# Patient Record
Sex: Male | Born: 1950 | Race: White | Hispanic: No | Marital: Married | Smoking: Former smoker
Health system: Southern US, Community
[De-identification: ages and names within clinical notes are randomized; demographics above are authoritative.]

## PROBLEM LIST (undated history)

## (undated) DIAGNOSIS — D494 Neoplasm of unspecified behavior of bladder: Secondary | ICD-10-CM

## (undated) DIAGNOSIS — N3289 Other specified disorders of bladder: Secondary | ICD-10-CM

## (undated) DIAGNOSIS — C801 Malignant (primary) neoplasm, unspecified: Secondary | ICD-10-CM

## (undated) DIAGNOSIS — R3912 Poor urinary stream: Secondary | ICD-10-CM

## (undated) DIAGNOSIS — J45909 Unspecified asthma, uncomplicated: Secondary | ICD-10-CM

## (undated) DIAGNOSIS — J449 Chronic obstructive pulmonary disease, unspecified: Secondary | ICD-10-CM

## (undated) DIAGNOSIS — K219 Gastro-esophageal reflux disease without esophagitis: Secondary | ICD-10-CM

## (undated) DIAGNOSIS — I712 Thoracic aortic aneurysm, without rupture, unspecified: Secondary | ICD-10-CM

## (undated) DIAGNOSIS — L989 Disorder of the skin and subcutaneous tissue, unspecified: Secondary | ICD-10-CM

## (undated) DIAGNOSIS — I714 Abdominal aortic aneurysm, without rupture, unspecified: Secondary | ICD-10-CM

## (undated) DIAGNOSIS — K5792 Diverticulitis of intestine, part unspecified, without perforation or abscess without bleeding: Secondary | ICD-10-CM

## (undated) DIAGNOSIS — E279 Disorder of adrenal gland, unspecified: Secondary | ICD-10-CM

## (undated) DIAGNOSIS — E663 Overweight: Secondary | ICD-10-CM

## (undated) DIAGNOSIS — E278 Other specified disorders of adrenal gland: Secondary | ICD-10-CM

## (undated) DIAGNOSIS — K5090 Crohn's disease, unspecified, without complications: Secondary | ICD-10-CM

## (undated) HISTORY — DX: Diverticulitis of intestine, part unspecified, without perforation or abscess without bleeding: K57.92

## (undated) HISTORY — DX: Overweight: E66.3

## (undated) HISTORY — PX: APPENDECTOMY: SHX54

## (undated) HISTORY — DX: Crohn's disease, unspecified, without complications: K50.90

## (undated) HISTORY — PX: TONSILLECTOMY: SUR1361

## (undated) HISTORY — DX: Neoplasm of unspecified behavior of bladder: D49.4

## (undated) HISTORY — DX: Poor urinary stream: R39.12

## (undated) HISTORY — PX: CHOLECYSTECTOMY: SHX55

## (undated) HISTORY — DX: Disorder of adrenal gland, unspecified: E27.9

## (undated) HISTORY — DX: Disorder of the skin and subcutaneous tissue, unspecified: L98.9

## (undated) HISTORY — DX: Other specified disorders of adrenal gland: E27.8

## (undated) HISTORY — DX: Unspecified asthma, uncomplicated: J45.909

## (undated) HISTORY — DX: Other specified disorders of bladder: N32.89

---

## 2014-01-17 DIAGNOSIS — J3089 Other allergic rhinitis: Secondary | ICD-10-CM | POA: Insufficient documentation

## 2014-01-17 DIAGNOSIS — J449 Chronic obstructive pulmonary disease, unspecified: Secondary | ICD-10-CM | POA: Insufficient documentation

## 2014-01-17 DIAGNOSIS — J4489 Other specified chronic obstructive pulmonary disease: Secondary | ICD-10-CM | POA: Insufficient documentation

## 2014-01-17 HISTORY — DX: Chronic obstructive pulmonary disease, unspecified: J44.9

## 2014-01-17 HISTORY — DX: Other specified chronic obstructive pulmonary disease: J44.89

## 2014-08-17 ENCOUNTER — Ambulatory Visit: Payer: Self-pay | Admitting: Urology

## 2014-11-13 ENCOUNTER — Other Ambulatory Visit: Payer: Self-pay | Admitting: Gastroenterology

## 2014-11-13 DIAGNOSIS — K5090 Crohn's disease, unspecified, without complications: Secondary | ICD-10-CM

## 2014-11-13 DIAGNOSIS — R748 Abnormal levels of other serum enzymes: Secondary | ICD-10-CM

## 2014-11-23 ENCOUNTER — Ambulatory Visit (HOSPITAL_COMMUNITY)
Admission: RE | Admit: 2014-11-23 | Discharge: 2014-11-23 | Disposition: A | Discharge status: Home / Self Care | Payer: BLUE CROSS/BLUE SHIELD | Source: Ambulatory Visit | Attending: Gastroenterology | Admitting: Gastroenterology

## 2014-11-23 DIAGNOSIS — R932 Abnormal findings on diagnostic imaging of liver and biliary tract: Secondary | ICD-10-CM | POA: Insufficient documentation

## 2014-11-23 DIAGNOSIS — K5090 Crohn's disease, unspecified, without complications: Secondary | ICD-10-CM | POA: Insufficient documentation

## 2014-11-23 DIAGNOSIS — I700 Atherosclerosis of aorta: Secondary | ICD-10-CM | POA: Insufficient documentation

## 2014-11-23 DIAGNOSIS — R748 Abnormal levels of other serum enzymes: Secondary | ICD-10-CM | POA: Diagnosis not present

## 2014-11-27 ENCOUNTER — Other Ambulatory Visit: Payer: Self-pay | Admitting: Gastroenterology

## 2014-11-27 DIAGNOSIS — R1032 Left lower quadrant pain: Secondary | ICD-10-CM

## 2014-11-30 ENCOUNTER — Encounter (HOSPITAL_COMMUNITY): Payer: Self-pay

## 2014-11-30 ENCOUNTER — Ambulatory Visit (HOSPITAL_COMMUNITY)
Admission: RE | Admit: 2014-11-30 | Discharge: 2014-11-30 | Disposition: A | Discharge status: Home / Self Care | Payer: BLUE CROSS/BLUE SHIELD | Source: Ambulatory Visit | Attending: Gastroenterology | Admitting: Gastroenterology

## 2014-11-30 DIAGNOSIS — R1032 Left lower quadrant pain: Secondary | ICD-10-CM | POA: Diagnosis not present

## 2014-11-30 HISTORY — DX: Malignant (primary) neoplasm, unspecified: C80.1

## 2014-11-30 MED ORDER — IOHEXOL 300 MG/ML  SOLN
100.0000 mL | Freq: Once | INTRAMUSCULAR | Status: AC | PRN
  Administered 2014-11-30: 100 mL via INTRAVENOUS

## 2014-12-03 LAB — SURGICAL PATHOLOGY

## 2014-12-09 NOTE — Op Note (Signed)
PATIENT NAME:  Caleb Evans, Caleb Evans MR#:  [id number] DATE OF BIRTH:  08/23/1950  DATE OF PROCEDURE:  08/17/2014  PREOPERATIVE DIAGNOSIS: Bladder tumor.   POSTOPERATIVE DIAGNOSIS: Bladder tumor.   PROCEDURE: Transurethral resection superficial bladder tumor, 2 cm in size.    SURGEON: Taleah Bellantoni D. Elnoria Howard, DO  ANESTHESIA: General.   DESCRIPTION OF PROCEDURE: With the patient sterilely prepped and draped in supine lithotomy position for ease of approach to the external genitalia, we have a timeout in the room. After the timeout is addressed, we begin the procedure. The distal urethra has to be dilated with male sounds one time to 60 [place]. It is done easily. I put the obturator of the saline resectoscope in and under direct vision view of the urethra and bladder. The urethra is normal. Prostate is slightly enlarged with some slight increase in the bladder neck height, but otherwise there is no real obstructive component to the prostate. The bladder is viewed. There is a single isolated tumor just proximal to the orifice of the left ureter. It is resected in one sweep. The area is cauterized, but there is no bleeding. The bipolar electrode on cutting actually took care of any bleeding. I check again after the bladder is emptied and after we remove all of the bladder tumor fragments. There is still no bleeding from the area of the resection. It has been fulgurated on the edges with cautery. He is sent to recovery after the bladder is emptied and 30 mL of 0.5% plain Marcaine placed in the bladder and a B and O suppository in the rectum. Bimanual exam is negative with the prostate small, nonnodular, firm and bilaterally symmetric. There are no rectal masses.     ____________________________ Janice Coffin. Elnoria Howard, DO rdh:at D: 08/17/2014 13:49:27 ET T: 08/17/2014 15:34:26 ET JOB#: [id number]  cc: Janice Coffin. Elnoria Howard, DO, <Dictator> [place] D Waneda Klammer DO ELECTRONICALLY SIGNED 09/10/2014 11:08

## 2015-02-15 ENCOUNTER — Other Ambulatory Visit: Payer: Self-pay | Admitting: Urology

## 2015-02-28 ENCOUNTER — Other Ambulatory Visit: Payer: Self-pay

## 2015-02-28 ENCOUNTER — Ambulatory Visit (INDEPENDENT_AMBULATORY_CARE_PROVIDER_SITE_OTHER): Payer: BLUE CROSS/BLUE SHIELD | Admitting: Urology

## 2015-02-28 ENCOUNTER — Encounter: Payer: Self-pay | Admitting: Urology

## 2015-02-28 ENCOUNTER — Encounter: Payer: Self-pay | Admitting: *Deleted

## 2015-02-28 VITALS — BP 144/99 | HR 77 | Resp 18 | Wt 172.4 lb

## 2015-02-28 DIAGNOSIS — C679 Malignant neoplasm of bladder, unspecified: Secondary | ICD-10-CM

## 2015-02-28 MED ORDER — LIDOCAINE HCL 2 % EX GEL
1.0000 "application " | Freq: Once | CUTANEOUS | Status: AC
  Administered 2015-02-28: 1 via URETHRAL

## 2015-02-28 MED ORDER — CIPROFLOXACIN HCL 500 MG PO TABS
500.0000 mg | ORAL_TABLET | Freq: Once | ORAL | Status: AC
  Administered 2015-02-28: 500 mg via ORAL

## 2015-02-28 NOTE — Progress Notes (Signed)
    Cystoscopy Procedure Note  Patient identification was confirmed, informed consent was obtained, and patient was prepped using Betadine solution.  Lidocaine jelly was administered per urethral meatus.    Preoperative abx where received prior to procedure.     Pre-Procedure: - Inspection reveals a normal caliber ureteral meatus.  Procedure: The flexible cystoscope was introduced without difficulty - No urethral strictures/leions are present. -  Slightly enlarged prostate with lateral lobe enlargement otherwise normal prostate   Normal bladder neck - Bilateral ureteral orifices identified - Bladder mucosa  reveals no ulcers, tumors, or lesions - No bladder stones - No trabeculation  Retroflexion shows no evidence of tumor    Post-Procedure: - Patient tolerated the procedure well

## 2015-03-01 LAB — URINALYSIS, COMPLETE
Bilirubin, UA: NEGATIVE
Glucose, UA: NEGATIVE
KETONES UA: NEGATIVE
NITRITE UA: NEGATIVE
PROTEIN UA: NEGATIVE
RBC, UA: NEGATIVE
Specific Gravity, UA: 1.01 (ref 1.005–1.030)
Urobilinogen, Ur: 0.2 mg/dL (ref 0.2–1.0)
pH, UA: 7 (ref 5.0–7.5)

## 2015-03-01 LAB — MICROSCOPIC EXAMINATION: Bacteria, UA: NONE SEEN

## 2015-05-31 ENCOUNTER — Encounter: Payer: Self-pay | Admitting: Urology

## 2015-05-31 ENCOUNTER — Ambulatory Visit (INDEPENDENT_AMBULATORY_CARE_PROVIDER_SITE_OTHER): Payer: PRIVATE HEALTH INSURANCE | Admitting: Urology

## 2015-05-31 VITALS — BP 132/89 | HR 79 | Ht 68.0 in | Wt 174.9 lb

## 2015-05-31 DIAGNOSIS — C679 Malignant neoplasm of bladder, unspecified: Secondary | ICD-10-CM

## 2015-05-31 LAB — URINALYSIS, COMPLETE
BILIRUBIN UA: NEGATIVE
GLUCOSE, UA: NEGATIVE
Ketones, UA: NEGATIVE
Nitrite, UA: NEGATIVE
Protein, UA: NEGATIVE
RBC UA: NEGATIVE
SPEC GRAV UA: 1.015 (ref 1.005–1.030)
UUROB: 0.2 mg/dL (ref 0.2–1.0)
pH, UA: 5.5 (ref 5.0–7.5)

## 2015-05-31 LAB — MICROSCOPIC EXAMINATION
Bacteria, UA: NONE SEEN
EPITHELIAL CELLS (NON RENAL): NONE SEEN /HPF (ref 0–10)
RBC, UA: NONE SEEN /hpf (ref 0–?)
RENAL EPITHEL UA: NONE SEEN /HPF

## 2015-05-31 MED ORDER — LIDOCAINE HCL 2 % EX GEL
1.0000 "application " | Freq: Once | CUTANEOUS | Status: AC
  Administered 2015-05-31: 1 via URETHRAL

## 2015-05-31 MED ORDER — CIPROFLOXACIN HCL 500 MG PO TABS
500.0000 mg | ORAL_TABLET | Freq: Once | ORAL | Status: AC
  Administered 2015-05-31: 500 mg via ORAL

## 2015-05-31 NOTE — Progress Notes (Signed)
05/31/2015 11:47 AM   Caleb Evans [birth year] [id number]  Referring provider: Theotis Burrow, MD [address] [place], [place] [address]  Chief Complaint  Patient presents with  . Cysto    CZY:SAYTKZS of bladder cancer with a resection last year this is his 6 month cystoscopy. This is a low-grade bladder tumor seen on the trigone in front of the ureter. It was very small area. 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 Still smoking occasional and he smokes camels.   PMH: Past Medical History  Diagnosis Date  . Cancer ([place])   . Skin lesion   . Weak urinary stream   . Crohn's disease ([place])   . Diverticulitis   . Adrenal nodule ([place])   . Over weight   . Bladder mass   . Hypertension   . Bladder neoplasm     Surgical History: Past Surgical History  Procedure Laterality Date  . Cholecystectomy    . Appendectomy      Home Medications:    Medication List       This list is accurate as of: 05/31/15 11:47 AM.  Always use your most recent med list.               ADVAIR DISKUS 250-50 MCG/DOSE Aepb  Generic drug:  Fluticasone-Salmeterol  Inhale into the lungs.     DYMISTA 137-50 MCG/ACT Susp  Generic drug:  Azelastine-Fluticasone  2 Application by Nasal route 2 (two) times daily.     loratadine 10 MG tablet  Commonly known as:  CLARITIN  Take by mouth.        Allergies: No Known Allergies  Family History: Family History  Problem Relation Age of Onset  . Bladder Cancer Neg Hx   . Kidney cancer Neg Hx   . Prostate cancer Neg Hx     Social History:  reports that he has been smoking Cigarettes.  He has been smoking about 1.00 pack per day. He does not have any smokeless tobacco history on file. He reports that he drinks alcohol. He reports that he does not use illicit drugs.  ROS: UROLOGY Frequent Urination?: No Hard to postpone urination?: No Burning/pain with urination?: No Get up at night to urinate?: No Leakage  of urine?: No Urine stream starts and stops?: No Trouble starting stream?: No Do you have to strain to urinate?: No Blood in urine?: No Urinary tract infection?: No Sexually transmitted disease?: No Injury to kidneys or bladder?: No Painful intercourse?: No Weak stream?: No Erection problems?: No Penile pain?: No  Gastrointestinal Nausea?: No Vomiting?: No Indigestion/heartburn?: Yes Diarrhea?: No Constipation?: No  Constitutional Fever: No Night sweats?: No Weight loss?: No Fatigue?: No  Skin Skin rash/lesions?: No Itching?: No  Eyes Blurred vision?: No Double vision?: No  Ears/Nose/Throat Sore throat?: No Sinus problems?: Yes  Hematologic/Lymphatic Swollen glands?: No Easy bruising?: No  Cardiovascular Leg swelling?: No Chest pain?: No  Respiratory Cough?: No Shortness of breath?: No  Endocrine Excessive thirst?: No  Musculoskeletal Back pain?: No Joint pain?: No  Neurological Headaches?: No Dizziness?: No  Psychologic Depression?: No Anxiety?: No  Physical Exam: BP 132/89 mmHg  Pulse 79  Ht 5' 8"  (1.727 m)  Wt 174 lb 14.4 oz (79.334 kg)  BMI 26.60 kg/m2  Constitutional:  Alert and oriented, No acute distress. HEENT: [place] AT, moist mucus membranes.  Trachea midline, no masses. Cardiovascular: No clubbing, cyanosis, or edema. Respiratory: Normal respiratory effort, no increased work of breathing. GI: Abdomen  is soft, nontender, nondistended, no abdominal masses GU: No CVA tenderness. Circumcised testes descended and normal prostate small small grade 2 over 4 at most with no nodules tumors or masses Skin: No rashes, bruises or suspicious lesions. Lymph: No cervical or inguinal adenopathy. Neurologic: Grossly intact, no focal deficits, moving all 4 extremities. Psychiatric: Normal mood and affect.  Laboratory Data: No results found for: WBC, HGB, HCT, MCV, PLT  No results found for: CREATININE  No results found for: PSA  No results  found for: TESTOSTERONE  No results found for: HGBA1C  Urinalysis    Component Value Date/Time   GLUCOSEU Negative 02/28/2015 1654   BILIRUBINUR Negative 02/28/2015 1654   NITRITE Negative 02/28/2015 1654   LEUKOCYTESUR Trace* 02/28/2015 1654    Pertinent Imaging: None  Assessment & Plan:    1. Malignant neoplasm of urinary bladder, unspecified site (HCC) TCC of the bladder was on the trigone and was low-grade malignant potential so he is not undergoing any postoperative immunotherapy with BCG he's on surveillance cystoscopy every 3 months for a year. Patient is no longer working with then aligned eyes but is occasionally smoking his camels advised not to continue smoking. He will have another cystoscopy in 3 months today cystoscopy was negative for tumor mass or growth and minimal prostate enlargement no tumor in the prostate  - lidocaine (XYLOCAINE) 2 % jelly 1 application; Place 1 application into the urethra once. - ciprofloxacin (CIPRO) tablet 500 mg; Take 1 tablet (500 mg total) by mouth once. - Urinalysis, Complete   No Follow-up on file.  Collier Flowers, [facility] [address] [place], [place] [address] [phone]

## 2015-05-31 NOTE — Progress Notes (Signed)
    Cystoscopy Procedure Note  Patient identification was confirmed, informed consent was obtained, and patient was prepped using Betadine solution.  Lidocaine jelly was administered per urethral meatus.    Preoperative abx where received prior to procedure.     Pre-Procedure: - Inspection reveals a normal caliber ureteral meatus.  Procedure: The flexible cystoscope was introduced without difficulty - No urethral strictures/lesions are present. -  prostate minimally enlarged -  bladder neck - Bilateral ureteral orifices identified - Bladder mucosa  reveals no ulcers, tumors, or lesions - No bladder stones - No trabeculation  Retroflexion shows no tumors or growths.   Post-Procedure: - Patient tolerated the procedure well

## 2015-08-29 ENCOUNTER — Telehealth: Payer: Self-pay | Admitting: Urology

## 2015-08-29 NOTE — Telephone Encounter (Signed)
Pt called and said he was just getting over a liver infection and wants to know if it's still O.K. To come if for cysto tomorrow afternoon.  Please advise.

## 2015-08-30 ENCOUNTER — Other Ambulatory Visit: Payer: BLUE CROSS/BLUE SHIELD | Admitting: Urology

## 2015-09-25 ENCOUNTER — Other Ambulatory Visit: Payer: BLUE CROSS/BLUE SHIELD

## 2015-09-26 ENCOUNTER — Other Ambulatory Visit: Payer: BLUE CROSS/BLUE SHIELD

## 2015-09-26 ENCOUNTER — Ambulatory Visit (INDEPENDENT_AMBULATORY_CARE_PROVIDER_SITE_OTHER): Payer: PRIVATE HEALTH INSURANCE | Admitting: Urology

## 2015-09-26 VITALS — BP 132/91 | HR 76 | Ht 68.0 in | Wt 174.9 lb

## 2015-09-26 DIAGNOSIS — C679 Malignant neoplasm of bladder, unspecified: Secondary | ICD-10-CM | POA: Diagnosis not present

## 2015-09-26 LAB — URINALYSIS, COMPLETE
BILIRUBIN UA: NEGATIVE
Glucose, UA: NEGATIVE
KETONES UA: NEGATIVE
Leukocytes, UA: NEGATIVE
Nitrite, UA: NEGATIVE
PROTEIN UA: NEGATIVE
RBC UA: NEGATIVE
Specific Gravity, UA: 1.02 (ref 1.005–1.030)
UUROB: 0.2 mg/dL (ref 0.2–1.0)
pH, UA: 5 (ref 5.0–7.5)

## 2015-09-26 LAB — MICROSCOPIC EXAMINATION
Bacteria, UA: NONE SEEN
Epithelial Cells (non renal): NONE SEEN /hpf (ref 0–10)
RBC MICROSCOPIC, UA: NONE SEEN /HPF (ref 0–?)

## 2015-09-26 MED ORDER — CIPROFLOXACIN HCL 500 MG PO TABS
500.0000 mg | ORAL_TABLET | Freq: Once | ORAL | Status: AC
  Administered 2015-09-26: 500 mg via ORAL

## 2015-09-26 MED ORDER — LIDOCAINE HCL 2 % EX GEL
1.0000 "application " | Freq: Once | CUTANEOUS | Status: AC
  Administered 2015-09-26: 1 via URETHRAL

## 2015-09-26 NOTE — Progress Notes (Signed)
09/26/2015 2:51 PM   [name] 29-Apr-1951 [id number]  Referring provider: Theotis Burrow, MD [address] [place], [place] [address]  Chief Complaint  Patient presents with  . 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 He has done well since his last appointment with no complaints or hematuria.   PMH: Past Medical History  Diagnosis Date  . Cancer ([place])   . Skin lesion   . Weak urinary stream   . Crohn's disease ([place])   . Diverticulitis   . Adrenal nodule ([place])   . Over weight   . Bladder mass   . Hypertension   . Bladder neoplasm     Surgical History: Past Surgical History  Procedure Laterality Date  . Cholecystectomy    . Appendectomy      Home Medications:    Medication List       This list is accurate as of: 09/26/15  2:51 PM.  Always use your most recent med list.               ADVAIR DISKUS 250-50 MCG/DOSE Aepb  Generic drug:  Fluticasone-Salmeterol  Inhale into the lungs.     DYMISTA 137-50 MCG/ACT Susp  Generic drug:  Azelastine-Fluticasone  Reported on 09/26/2015     loratadine 10 MG tablet  Commonly known as:  CLARITIN  Take by mouth.        Allergies: No Known Allergies  Family History: Family History  Problem Relation Age of Onset  . Bladder Cancer Neg Hx   . Kidney cancer Neg Hx   . Prostate cancer Neg Hx     Social History:  reports that he has been smoking Cigarettes.  He has been smoking about 1.00 pack per day. He does not have any smokeless tobacco history on file. He reports that he drinks alcohol. He reports that he does not use illicit drugs.  ROS:                                        Physical Exam: BP 132/91 mmHg  Pulse 76  Ht 5' 8"  (1.727 m)  Wt 174 lb 14.4 oz (79.334 kg)  BMI 26.60 kg/m2  Constitutional:  Alert and oriented, No acute  distress. HEENT: [place] AT, moist mucus membranes.  Trachea midline, no masses. Cardiovascular: No clubbing, cyanosis, or edema. Respiratory: Normal respiratory effort, no increased work of breathing. GI: Abdomen is soft, nontender, nondistended, no abdominal masses GU: No CVA tenderness.  Skin: No rashes, bruises or suspicious lesions. Lymph: No cervical or inguinal adenopathy. Neurologic: Grossly intact, no focal deficits, moving all 4 extremities. Psychiatric: Normal mood and affect.  Laboratory Data: No results found for: WBC, HGB, HCT, MCV, PLT  No results found for: CREATININE  No results found for: PSA  No results found for: TESTOSTERONE  No results found for: HGBA1C  Urinalysis    Component Value Date/Time   GLUCOSEU Negative 05/31/2015 1121   BILIRUBINUR Negative 05/31/2015 1121   NITRITE Negative 05/31/2015 1121   LEUKOCYTESUR Trace* 05/31/2015 1121     Cystoscopy Procedure Note  Patient identification was confirmed, informed consent was obtained, and patient was prepped using Betadine solution.  Lidocaine jelly was administered per urethral meatus.    Preoperative abx where received prior  to procedure.     Pre-Procedure: - Inspection reveals a normal caliber ureteral meatus.  Procedure: The flexible cystoscope was introduced without difficulty - No urethral strictures/lesions are present. - Enlarged prostate  - Normal bladder neck - Bilateral ureteral orifices identified - Bladder mucosa  reveals no ulcers, tumors, or lesions - No bladder stones - No trabeculation  Retroflexion shows no intravesical lobe or tumor   Post-Procedure: - Patient tolerated the procedure well    Assessment & Plan:    1. pTa TCC of urinary bladder status post TURBT in general 2016 with negative surveillance cystoscopy today -follow up in 3 months for repeat surveillance cystoscopy   Return in about 3 months (around 12/24/2015) for cystoscopy.  Nickie Retort,  MD  [facility] [address], [place], [place] [address] [phone]

## 2015-12-25 ENCOUNTER — Other Ambulatory Visit: Payer: BLUE CROSS/BLUE SHIELD

## 2015-12-26 ENCOUNTER — Encounter: Payer: Self-pay | Admitting: Urology

## 2015-12-26 ENCOUNTER — Ambulatory Visit (INDEPENDENT_AMBULATORY_CARE_PROVIDER_SITE_OTHER): Payer: PRIVATE HEALTH INSURANCE | Admitting: Urology

## 2015-12-26 VITALS — BP 137/83 | HR 69 | Ht 68.0 in | Wt 178.4 lb

## 2015-12-26 DIAGNOSIS — C678 Malignant neoplasm of overlapping sites of bladder: Secondary | ICD-10-CM

## 2015-12-26 LAB — MICROSCOPIC EXAMINATION: Bacteria, UA: NONE SEEN

## 2015-12-26 LAB — URINALYSIS, COMPLETE
BILIRUBIN UA: NEGATIVE
Glucose, UA: NEGATIVE
KETONES UA: NEGATIVE
Nitrite, UA: NEGATIVE
PROTEIN UA: NEGATIVE
Specific Gravity, UA: 1.02 (ref 1.005–1.030)
Urobilinogen, Ur: 0.2 mg/dL (ref 0.2–1.0)
pH, UA: 7 (ref 5.0–7.5)

## 2015-12-26 MED ORDER — LIDOCAINE HCL 2 % EX GEL
1.0000 "application " | Freq: Once | CUTANEOUS | Status: AC
  Administered 2015-12-26: 1 via URETHRAL

## 2015-12-26 MED ORDER — CIPROFLOXACIN HCL 500 MG PO TABS
500.0000 mg | ORAL_TABLET | Freq: Once | ORAL | Status: AC
  Administered 2015-12-26: 500 mg via ORAL

## 2015-12-26 NOTE — Progress Notes (Signed)
12/26/2015 2:38 PM   [name] 09-12-1950 [id number]  Referring provider: Theotis Burrow, MD [address] [place], [place] [address]  Chief Complaint  Patient presents with  . Cysto    bladder cancer    HPI: The patient is a 65 year old gentleman with a history of pTa TCC (1.2 cm) of the bladder on TURBT in January 2016 presents today for surveillance cystoscopy. He has done well since his last appointment with no complaints or hematuria.     PMH: Past Medical History  Diagnosis Date  . Cancer ([place])   . Skin lesion   . Weak urinary stream   . Crohn's disease ([place])   . Diverticulitis   . Adrenal nodule ([place])   . Over weight   . Bladder mass   . Hypertension   . Bladder neoplasm     Surgical History: Past Surgical History  Procedure Laterality Date  . Cholecystectomy    . Appendectomy      Home Medications:    Medication List       This list is accurate as of: 12/26/15  2:38 PM.  Always use your most recent med list.               ADVAIR DISKUS 250-50 MCG/DOSE Aepb  Generic drug:  Fluticasone-Salmeterol  Inhale into the lungs.     DYMISTA 137-50 MCG/ACT Susp  Generic drug:  Azelastine-Fluticasone  Reported on 12/26/2015        Allergies: No Known Allergies  Family History: Family History  Problem Relation Age of Onset  . Bladder Cancer Neg Hx   . Kidney cancer Neg Hx   . Prostate cancer Neg Hx     Social History:  reports that he has been smoking Cigarettes.  He has been smoking about 1.00 pack per day. He does not have any smokeless tobacco history on file. He reports that he drinks alcohol. He reports that he does not use illicit drugs.  ROS:                                        Physical Exam: Ht 5' 8"  (1.727 m)  Wt 178 lb 6.4 oz (80.922 kg)  BMI 27.13 kg/m2  Constitutional:  Alert and oriented, No acute distress. HEENT: [place] AT, moist mucus membranes.  Trachea midline, no  masses. Cardiovascular: No clubbing, cyanosis, or edema. Respiratory: Normal respiratory effort, no increased work of breathing. GI: Abdomen is soft, nontender, nondistended, no abdominal masses GU: No CVA tenderness.  Skin: No rashes, bruises or suspicious lesions. Lymph: No cervical or inguinal adenopathy. Neurologic: Grossly intact, no focal deficits, moving all 4 extremities. Psychiatric: Normal mood and affect.  Laboratory Data: No results found for: WBC, HGB, HCT, MCV, PLT  No results found for: CREATININE  No results found for: PSA  No results found for: TESTOSTERONE  No results found for: HGBA1C  Urinalysis    Component Value Date/Time   APPEARANCEUR Clear 09/26/2015 1415   GLUCOSEU Negative 09/26/2015 1415   BILIRUBINUR Negative 09/26/2015 1415   PROTEINUR Negative 09/26/2015 1415   NITRITE Negative 09/26/2015 1415   LEUKOCYTESUR Negative 09/26/2015 1415   Cystoscopy Procedure Note  Patient identification was confirmed, informed consent was obtained, and patient was prepped using Betadine solution. Lidocaine jelly was administered per urethral meatus.   Preoperative abx where received prior to procedure.    Pre-Procedure: - Inspection  reveals a normal caliber ureteral meatus.  Procedure: The flexible cystoscope was introduced without difficulty - No urethral strictures/lesions are present. - Enlarged prostate  - Normal bladder neck - Bilateral ureteral orifices identified - Bladder mucosa reveals no ulcers, tumors, or lesions - No bladder stones - No trabeculation  Retroflexion shows no intravesical lobe or tumor   Post-Procedure: - Patient tolerated the procedure well  Assessment & Plan:    1. pTa TCC of urinary bladder status post TURBT in January 2016 with negative surveillance cystoscopy today (low risk) -follow up in 6 months for repeat cystoscopy  Return in about 6 months (around 06/27/2016) for cystoscopy.  Nickie Retort,  MD  [facility] [address], [place], [place] [address] [phone]

## 2016-03-25 ENCOUNTER — Emergency Department (HOSPITAL_COMMUNITY)
Admission: EM | Admit: 2016-03-25 | Discharge: 2016-03-25 | Disposition: A | Discharge status: Home / Self Care | Payer: BLUE CROSS/BLUE SHIELD | Attending: Emergency Medicine | Admitting: Emergency Medicine

## 2016-03-25 ENCOUNTER — Emergency Department (HOSPITAL_COMMUNITY): Payer: BLUE CROSS/BLUE SHIELD

## 2016-03-25 ENCOUNTER — Encounter (HOSPITAL_COMMUNITY): Payer: Self-pay | Admitting: Cardiology

## 2016-03-25 DIAGNOSIS — J069 Acute upper respiratory infection, unspecified: Secondary | ICD-10-CM | POA: Insufficient documentation

## 2016-03-25 DIAGNOSIS — R0602 Shortness of breath: Secondary | ICD-10-CM | POA: Diagnosis present

## 2016-03-25 DIAGNOSIS — I10 Essential (primary) hypertension: Secondary | ICD-10-CM | POA: Diagnosis not present

## 2016-03-25 DIAGNOSIS — J441 Chronic obstructive pulmonary disease with (acute) exacerbation: Secondary | ICD-10-CM | POA: Insufficient documentation

## 2016-03-25 DIAGNOSIS — Z79899 Other long term (current) drug therapy: Secondary | ICD-10-CM | POA: Insufficient documentation

## 2016-03-25 DIAGNOSIS — F17210 Nicotine dependence, cigarettes, uncomplicated: Secondary | ICD-10-CM | POA: Insufficient documentation

## 2016-03-25 MED ORDER — PREDNISONE 20 MG PO TABS: 40.0000 mg | ORAL_TABLET | Freq: Every day | ORAL | 0 refills | Status: AC

## 2016-03-25 MED ORDER — IPRATROPIUM-ALBUTEROL 0.5-2.5 (3) MG/3ML IN SOLN
3.0000 mL | Freq: Once | RESPIRATORY_TRACT | Status: AC
  Administered 2016-03-25: 3 mL via RESPIRATORY_TRACT
  Filled 2016-03-25: qty 3

## 2016-03-25 MED ORDER — IPRATROPIUM BROMIDE 0.02 % IN SOLN: 0.5000 mg | Freq: Once | RESPIRATORY_TRACT | Status: DC

## 2016-03-25 MED ORDER — ALBUTEROL SULFATE (2.5 MG/3ML) 0.083% IN NEBU: 5.0000 mg | INHALATION_SOLUTION | Freq: Once | RESPIRATORY_TRACT | Status: DC

## 2016-03-25 MED ORDER — PREDNISONE 50 MG PO TABS
60.0000 mg | ORAL_TABLET | Freq: Once | ORAL | Status: AC
  Administered 2016-03-25: 60 mg via ORAL
  Filled 2016-03-25: qty 1

## 2016-03-25 MED ORDER — ALBUTEROL SULFATE (2.5 MG/3ML) 0.083% IN NEBU
2.5000 mg | INHALATION_SOLUTION | Freq: Once | RESPIRATORY_TRACT | Status: AC
  Administered 2016-03-25: 2.5 mg via RESPIRATORY_TRACT
  Filled 2016-03-25: qty 3

## 2016-03-25 MED ORDER — DOXYCYCLINE HYCLATE 100 MG PO CAPS: 100.0000 mg | ORAL_CAPSULE | Freq: Two times a day (BID) | ORAL | 0 refills | Status: DC

## 2016-03-25 NOTE — ED Provider Notes (Signed)
[facility] DEPT Provider Note   CSN: [id number] Arrival date & time: 03/25/16  1633     History   Chief Complaint Chief Complaint  Patient presents with  . Shortness of Breath    HPI Caleb Evans is a 65 y.o. male.  The history is provided by the patient.  Shortness of Breath  Associated symptoms include cough, wheezing and chest pain. Pertinent negatives include no abdominal pain.  Patient resents with shortness of breath and cough. History of COPD. He has had a cough with some yellow sputum production over last 3 weeks. States his been worse last couple days. His inhalers have not helped a lot. No fevers or chills. Slight chest tightness. No abdominal pain. No lightheadedness or dizziness. He continues to smoke cigarettes. Shortness of breath is worse with exertion.  Past Medical History:  Diagnosis Date  . Adrenal nodule ([place])   . Bladder mass   . Bladder neoplasm   . Cancer ([place])   . Crohn's disease ([place])   . Diverticulitis   . Hypertension   . Over weight   . Skin lesion   . Weak urinary stream     Patient Active Problem List   Diagnosis Date Noted  . Allergic rhinitis 01/17/2014  . COPD, mild ([place]) 01/17/2014  . Mild chronic obstructive pulmonary disease ([place]) 01/17/2014    Past Surgical History:  Procedure Laterality Date  . APPENDECTOMY    . CHOLECYSTECTOMY         Home Medications    Prior to Admission medications   Medication Sig Start Date End Date Taking? Authorizing Provider  albuterol (PROAIR HFA) 108 (90 Base) MCG/ACT inhaler Inhale 1-2 puffs into the lungs every 6 (six) hours as needed for wheezing or shortness of breath.   Yes Historical Provider, MD  cetirizine (ZYRTEC) 10 MG tablet Take 10 mg by mouth daily.   Yes Historical Provider, MD  fluticasone (FLONASE) 50 MCG/ACT nasal spray Place 2 sprays into both nostrils daily.   Yes Historical Provider, MD  Fluticasone-Salmeterol (ADVAIR DISKUS) 250-50 MCG/DOSE AEPB Inhale 1 puff  into the lungs 2 (two) times daily.  09/28/14  Yes Historical Provider, MD  Multiple Vitamin (MULTIVITAMIN WITH MINERALS) TABS tablet Take 1 tablet by mouth daily.   Yes Historical Provider, MD  doxycycline (VIBRAMYCIN) 100 MG capsule Take 1 capsule (100 mg total) by mouth 2 (two) times daily. 03/25/16   [name], MD  predniSONE (DELTASONE) 20 MG tablet Take 2 tablets (40 mg total) by mouth daily. 03/26/16 03/30/16  [name], MD    Family History Family History  Problem Relation Age of Onset  . Bladder Cancer Neg Hx   . Kidney cancer Neg Hx   . Prostate cancer Neg Hx     Social History Social History  Substance Use Topics  . Smoking status: Current Every Day Smoker    Packs/day: 0.50    Types: Cigarettes  . Smokeless tobacco: Not on file  . Alcohol use 0.0 oz/week     Comment: occasional     Allergies   Amoxicillin-pot clavulanate   Review of Systems Review of Systems  Constitutional: Negative for appetite change.  Respiratory: Positive for cough, shortness of breath and wheezing. Negative for apnea.   Cardiovascular: Positive for chest pain.  Gastrointestinal: Negative for abdominal pain.  Genitourinary: Negative for flank pain.  Musculoskeletal: Negative for back pain.  Hematological: Negative for adenopathy.     Physical Exam Updated Vital Signs BP 130/89 (BP Location: Left Arm)  Pulse 82   Temp 97.6 F (36.4 C) (Oral)   Resp 20   Ht 5' 7"  (1.702 m)   Wt 180 lb (81.6 kg)   SpO2 96%   BMI 28.19 kg/m   Physical Exam  Constitutional: He appears well-developed and well-nourished.  HENT:  Head: Atraumatic.  Eyes: EOM are normal.  Neck: No JVD present.  Cardiovascular: Normal rate.   Pulmonary/Chest:  Mild diffuse wheezes and prolonged expirations. No respiratory distress.  Abdominal: Soft. There is no tenderness.  Musculoskeletal: He exhibits no edema.  Neurological: He is alert.  Skin: Skin is warm. Capillary refill takes less than 2  seconds.  Psychiatric: He has a normal mood and affect.     ED Treatments / Results  Labs (all labs ordered are listed, but only abnormal results are displayed) Labs Reviewed - No data to display  EKG  EKG Interpretation None       Radiology Dg Chest 2 View  Result Date: 03/25/2016 CLINICAL DATA:  Cough. Productive. Short of breath 3 weeks duration, worsening recently. EXAM: CHEST  2 VIEW COMPARISON:  None. FINDINGS: Heart size is normal. There is aortic atherosclerosis. Upper lungs are clear. Markings at the lung bases are slightly prominent with mild volume loss. This could be chronic scarring/ fibrosis or there could be mild bibasilar pneumonia. No dense consolidation or lobar collapse. No effusion. Chronic degenerative changes affect the spine. IMPRESSION: Abnormal markings at the lung bases with slight volume loss. This could represent chronic scarring/ fibrosis or mild bibasilar pneumonia. Electronically Signed   By: Nelson Chimes M.D.   On: 03/25/2016 17:47    Procedures Procedures (including critical care time)  Medications Ordered in ED Medications  predniSONE (DELTASONE) tablet 60 mg (60 mg Oral Given 03/25/16 1759)  albuterol (PROVENTIL) (2.5 MG/3ML) 0.083% nebulizer solution 2.5 mg (2.5 mg Nebulization Given 03/25/16 1746)  ipratropium-albuterol (DUONEB) 0.5-2.5 (3) MG/3ML nebulizer solution 3 mL (3 mLs Nebulization Given 03/25/16 1746)     Initial Impression / Assessment and Plan / ED Course  I have reviewed the triage vital signs and the nursing notes.  Pertinent labs & imaging results that were available during my care of the patient were reviewed by me and considered in my medical decision making (see chart for details).  Patient with sob and cough for 3 weeks. Increased sputum production. Xray possible pneumonia. Feel better after breathing treatment. Will d/c.  Final Clinical Impressions(s) / ED Diagnoses   Final diagnoses:  COPD exacerbation (HCC)  URI  (upper respiratory infection)    New Prescriptions New Prescriptions   DOXYCYCLINE (VIBRAMYCIN) 100 MG CAPSULE    Take 1 capsule (100 mg total) by mouth 2 (two) times daily.   PREDNISONE (DELTASONE) 20 MG TABLET    Take 2 tablets (40 mg total) by mouth daily.     [name], MD 03/25/16 [phone]

## 2016-03-25 NOTE — ED Notes (Signed)
Pt taken to XR. Respiratory called to give breathing tx.

## 2016-03-25 NOTE — ED Triage Notes (Signed)
Sob times 3 weeks.  States he can hear himself wheezing.

## 2016-04-08 ENCOUNTER — Encounter: Payer: Self-pay | Admitting: Allergy

## 2016-04-08 ENCOUNTER — Ambulatory Visit (INDEPENDENT_AMBULATORY_CARE_PROVIDER_SITE_OTHER): Payer: PRIVATE HEALTH INSURANCE | Admitting: Allergy

## 2016-04-08 VITALS — BP 118/80 | HR 88 | Temp 98.3°F | Resp 20 | Ht 66.1 in | Wt 175.2 lb

## 2016-04-08 DIAGNOSIS — H1010 Acute atopic conjunctivitis, unspecified eye: Secondary | ICD-10-CM

## 2016-04-08 DIAGNOSIS — K219 Gastro-esophageal reflux disease without esophagitis: Secondary | ICD-10-CM | POA: Insufficient documentation

## 2016-04-08 DIAGNOSIS — J4550 Severe persistent asthma, uncomplicated: Secondary | ICD-10-CM

## 2016-04-08 DIAGNOSIS — Z720 Tobacco use: Secondary | ICD-10-CM | POA: Insufficient documentation

## 2016-04-08 DIAGNOSIS — J309 Allergic rhinitis, unspecified: Secondary | ICD-10-CM | POA: Diagnosis not present

## 2016-04-08 DIAGNOSIS — J449 Chronic obstructive pulmonary disease, unspecified: Secondary | ICD-10-CM

## 2016-04-08 MED ORDER — FLUTICASONE PROPIONATE 50 MCG/ACT NA SUSP: 2.0000 | Freq: Every day | NASAL | 5 refills | Status: DC

## 2016-04-08 MED ORDER — FEXOFENADINE HCL 180 MG PO TABS: 180.0000 mg | ORAL_TABLET | Freq: Every day | ORAL | 5 refills | Status: DC

## 2016-04-08 MED ORDER — MONTELUKAST SODIUM 10 MG PO TABS: 10.0000 mg | ORAL_TABLET | Freq: Every day | ORAL | 5 refills | Status: DC

## 2016-04-08 MED ORDER — AZELASTINE HCL 0.1 % NA SOLN: 2.0000 | Freq: Two times a day (BID) | NASAL | 5 refills | Status: DC

## 2016-04-08 MED ORDER — OLOPATADINE HCL 0.2 % OP SOLN: 1.0000 [drp] | Freq: Every day | OPHTHALMIC | 5 refills | Status: DC | PRN

## 2016-04-08 MED ORDER — FLUTICASONE FUROATE-VILANTEROL 200-25 MCG/INH IN AEPB: 1.0000 | INHALATION_SPRAY | Freq: Every day | RESPIRATORY_TRACT | 5 refills | Status: DC

## 2016-04-08 NOTE — Patient Instructions (Addendum)
Allergic rhinitis  - allergy testing was positive for trees, weed, mold and dust mites  - Start Allegra (fexofenadine) 180 mg daily  - Use nasal saline rinse prior to nasal spray use  - Start Astelin 2 sprays twice a day and continue use of Flonase 2 sprays daily  - Start Pataday 1 drop each eye as needed for itchy watery red eyes  - Start trial of Singulair 10 mg at bedtime  Asthma  - Stop Advair Diskus  - Start Breo 200 1 puff daily  - Continue albuterol as needed  - will trial Singulair as above  Asthma control goals:   Full participation in all desired activities (may need albuterol before activity)  Albuterol use two time or less a week on average (not counting use with activity)  Cough interfering with sleep two time or less a month  Oral steroids no more than once a year  No hospitalizations   Reflux -Continue Nexium daily  Tobacco use  - Encouraged cessation of smoking  Follow-up in 2 to 3 months positive for trees weed mold and dust mites
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
## 2016-04-09 ENCOUNTER — Ambulatory Visit (INDEPENDENT_AMBULATORY_CARE_PROVIDER_SITE_OTHER): Payer: BLUE CROSS/BLUE SHIELD | Admitting: Pulmonary Disease

## 2016-04-09 ENCOUNTER — Encounter: Payer: Self-pay | Admitting: Pulmonary Disease

## 2016-04-09 VITALS — BP 132/74 | HR 80 | Ht 68.0 in | Wt 174.6 lb

## 2016-04-09 DIAGNOSIS — J449 Chronic obstructive pulmonary disease, unspecified: Secondary | ICD-10-CM

## 2016-04-09 DIAGNOSIS — Z720 Tobacco use: Secondary | ICD-10-CM

## 2016-04-09 LAB — NITRIC OXIDE: NITRIC OXIDE: 80
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
## 2016-04-09 NOTE — Patient Instructions (Signed)
We will check an FENO, send you for a CBC with differential and IgE levels. We'll schedule for a high-resolution CT of the chest.  Return to clinic in 3 months.

## 2016-04-14 ENCOUNTER — Telehealth: Payer: Self-pay | Admitting: *Deleted

## 2016-04-14 NOTE — Telephone Encounter (Signed)
Received PA from pharmacy. Will do PA.

## 2016-04-15 ENCOUNTER — Telehealth: Payer: Self-pay | Admitting: Allergy

## 2016-04-15 MED ORDER — PREDNISONE 10 MG PO TABS: ORAL_TABLET | ORAL | 0 refills | Status: DC

## 2016-04-15 NOTE — Telephone Encounter (Signed)
Dr Nelva Bush please advise

## 2016-04-15 NOTE — Telephone Encounter (Signed)
It sounds like he is having an exacerbation of his asthma.  Rec he use his albuterol right now every 4 hours during day for next 2-3 days then every 6 hours for 2 days then ever 8 hours for 2 days then resume as needed.  Please send in prednisone 48m x3day, 250mx2day, 1069m 2 day at stop.  He if does not improve he should be re-evaluated or go to urgent care.  Continue all other care as discussed at visit.

## 2016-04-15 NOTE — Telephone Encounter (Signed)
Patient called and was seen on 8/30 by Dr. Nelva Bush. He said he is not getting better. He can't take a deep breath and he is only sleeping about an hour a night because he can't stop coughing.

## 2016-04-15 NOTE — Telephone Encounter (Signed)
Patient called back advised as written below verbalizes understanding. Prednisone sent to pharmacy

## 2016-04-15 NOTE — Telephone Encounter (Signed)
Left message to return call 

## 2016-04-17 NOTE — Telephone Encounter (Signed)
Faxed PA

## 2016-04-22 ENCOUNTER — Ambulatory Visit (HOSPITAL_COMMUNITY)
Admission: RE | Admit: 2016-04-22 | Discharge: 2016-04-22 | Disposition: A | Discharge status: Home / Self Care | Payer: BLUE CROSS/BLUE SHIELD | Source: Ambulatory Visit | Attending: Pulmonary Disease | Admitting: Pulmonary Disease

## 2016-04-22 DIAGNOSIS — I2510 Atherosclerotic heart disease of native coronary artery without angina pectoris: Secondary | ICD-10-CM | POA: Diagnosis not present

## 2016-04-22 DIAGNOSIS — Z720 Tobacco use: Secondary | ICD-10-CM | POA: Diagnosis present

## 2016-04-22 DIAGNOSIS — J449 Chronic obstructive pulmonary disease, unspecified: Secondary | ICD-10-CM

## 2016-04-22 DIAGNOSIS — I77810 Thoracic aortic ectasia: Secondary | ICD-10-CM | POA: Insufficient documentation

## 2016-04-22 DIAGNOSIS — J479 Bronchiectasis, uncomplicated: Secondary | ICD-10-CM | POA: Diagnosis not present

## 2016-04-22 DIAGNOSIS — I700 Atherosclerosis of aorta: Secondary | ICD-10-CM | POA: Diagnosis not present

## 2016-04-23 ENCOUNTER — Other Ambulatory Visit: Payer: Self-pay | Admitting: Pulmonary Disease

## 2016-04-23 DIAGNOSIS — J479 Bronchiectasis, uncomplicated: Secondary | ICD-10-CM

## 2016-04-23 NOTE — Progress Notes (Signed)
Called spoke with patient and spouse (on speaker phone) and discussed CT results / recs as stated by PM.  Pt and spouse voiced their understanding.  Pt asked if he could go to [facility] for his labs.  Advised yes, he may do so.  Orders only encounter created for orders.

## 2016-04-24 ENCOUNTER — Other Ambulatory Visit (HOSPITAL_COMMUNITY)
Admission: RE | Admit: 2016-04-24 | Discharge: 2016-04-24 | Disposition: A | Discharge status: Home / Self Care | Payer: BLUE CROSS/BLUE SHIELD | Source: Ambulatory Visit | Attending: Pulmonary Disease | Admitting: Pulmonary Disease

## 2016-04-24 ENCOUNTER — Telehealth: Payer: Self-pay | Admitting: Pulmonary Disease

## 2016-04-24 NOTE — Telephone Encounter (Signed)
The orders are in Epic, it was my understanding that they could see our orders Have already tried to reach the lab at [facility] about 41mns ago to ensure they could see the orders but [place] [phone] but had to wait for operator to answer the phone Ended up calling pt who stated that they were able to find the orders and he's had his labs drawn Nothing further needed; will sign off

## 2016-04-24 NOTE — Telephone Encounter (Signed)
Orders are in system, can [facility] see what we see?

## 2016-04-25 LAB — MISC LABCORP TEST (SEND OUT): Labcorp test code: 602628

## 2016-04-29 ENCOUNTER — Other Ambulatory Visit (INDEPENDENT_AMBULATORY_CARE_PROVIDER_SITE_OTHER): Payer: BLUE CROSS/BLUE SHIELD

## 2016-04-29 ENCOUNTER — Encounter: Payer: Self-pay | Admitting: Pulmonary Disease

## 2016-04-29 ENCOUNTER — Ambulatory Visit (INDEPENDENT_AMBULATORY_CARE_PROVIDER_SITE_OTHER): Payer: BLUE CROSS/BLUE SHIELD | Admitting: Pulmonary Disease

## 2016-04-29 VITALS — BP 102/72 | HR 82 | Ht 68.0 in | Wt 174.6 lb

## 2016-04-29 DIAGNOSIS — J449 Chronic obstructive pulmonary disease, unspecified: Secondary | ICD-10-CM

## 2016-04-29 LAB — CBC WITH DIFFERENTIAL/PLATELET
BASOS ABS: 0.1 10*3/uL (ref 0.0–0.1)
Basophils Relative: 0.6 % (ref 0.0–3.0)
EOS ABS: 1.2 10*3/uL — AB (ref 0.0–0.7)
Eosinophils Relative: 10.1 % — ABNORMAL HIGH (ref 0.0–5.0)
HCT: 43.5 % (ref 39.0–52.0)
Hemoglobin: 15.1 g/dL (ref 13.0–17.0)
LYMPHS ABS: 2.7 10*3/uL (ref 0.7–4.0)
Lymphocytes Relative: 21.8 % (ref 12.0–46.0)
MCHC: 34.7 g/dL (ref 30.0–36.0)
MCV: 89.9 fl (ref 78.0–100.0)
MONOS PCT: 5.5 % (ref 3.0–12.0)
Monocytes Absolute: 0.7 10*3/uL (ref 0.1–1.0)
NEUTROS ABS: 7.6 10*3/uL (ref 1.4–7.7)
NEUTROS PCT: 62 % (ref 43.0–77.0)
PLATELETS: 415 10*3/uL — AB (ref 150.0–400.0)
RBC: 4.84 Mil/uL (ref 4.22–5.81)
RDW: 14.6 % (ref 11.5–15.5)
WBC: 12.3 10*3/uL — ABNORMAL HIGH (ref 4.0–10.5)

## 2016-04-29 MED ORDER — BUDESONIDE-FORMOTEROL FUMARATE 160-4.5 MCG/ACT IN AERO: 2.0000 | INHALATION_SPRAY | Freq: Two times a day (BID) | RESPIRATORY_TRACT | 3 refills | Status: DC

## 2016-04-29 MED ORDER — TIOTROPIUM BROMIDE MONOHYDRATE 2.5 MCG/ACT IN AERS: 2.0000 | INHALATION_SPRAY | Freq: Every day | RESPIRATORY_TRACT | 3 refills | Status: DC

## 2016-04-29 NOTE — Patient Instructions (Signed)
We will change the Advair to Symbicort. He'll be given another prednisone taper starting at 40 mg. Reduce dose to 10 mg every 4 days. Stop taking the Zyrtec and Allegra. We'll start him chlorpheniramine 8 mg 3 times daily. Continue the Astelin and Flonase nasal spray. We will repeat blood tests to check for the allergy profile.  Return to clinic in 1 month.

## 2016-04-29 NOTE — Progress Notes (Signed)
Deaundra Kutzer    [id number]    Jul 29, 1951  Primary Care Physician:Adrian Ladoris Gene, MD  Referring Physician: Theotis Burrow, MD [address] [place], [place] [address]  Chief complaint:  Follow up dyspnea, asthma.  HPI: Mr. Purves is a 65 year old with past medical history of asthma. He was diagnosed about 20 years ago. He had been maintained on Advair since 2001. He has history of seasonal allergies, rhinitis, postnasal drip. His symptoms are usually worse in spring and fall. He had been using Flonase and Zyrtec for his allergies. He also has reflux, heartburn symptoms and is on Nexium for this.   He was given Breo inhaler last week but this was denied by his insurance. He continues on the Advair 250/50. He reports worsening of symptoms over the past few weeks. He has daily awakenings at night with cough, wheezing, mucus production. He had a prednisone taper from his allergist last week. This helped with the symptoms temporarily but they have returned after he finished the course.  He used to work in the [employer], trade union at [employer] was exposed to significant dust, asbestos, chemicals at the [employer] in September 2011.  Outpatient Encounter Prescriptions as of 04/29/2016  Medication Sig  . albuterol (PROAIR HFA) 108 (90 Base) MCG/ACT inhaler Inhale 1-2 puffs into the lungs every 6 (six) hours as needed for wheezing or shortness of breath.  Marland Kitchen azelastine (ASTELIN) 0.1 % nasal spray Place 2 sprays into both nostrils 2 (two) times daily. Use in each nostril as directed  . cetirizine (ZYRTEC) 10 MG tablet Take 10 mg by mouth daily.  . Esomeprazole Magnesium (NEXIUM PO) Take by mouth.  . fexofenadine (ALLEGRA) 180 MG tablet Take 1 tablet (180 mg total) by mouth daily.  . fluticasone (FLONASE) 50 MCG/ACT nasal spray Place 2 sprays into both nostrils daily.  . Fluticasone-Salmeterol (ADVAIR DISKUS) 250-50 MCG/DOSE AEPB Inhale 1 puff into the lungs 2 (two)  times daily.   . montelukast (SINGULAIR) 10 MG tablet Take 1 tablet (10 mg total) by mouth at bedtime.  . Olopatadine HCl (PATADAY) 0.2 % SOLN Place 1 drop into both eyes daily as needed.  . fluticasone furoate-vilanterol (BREO ELLIPTA) 200-25 MCG/INH AEPB Inhale 1 puff into the lungs daily. (Patient not taking: Reported on 04/29/2016)  . Multiple Vitamin (MULTIVITAMIN WITH MINERALS) TABS tablet Take 1 tablet by mouth daily.  . predniSONE (DELTASONE) 10 MG tablet Take 28m x 3 days; then  279mx 2 days; then 1063m 2 days. (Patient not taking: Reported on 04/29/2016)   No facility-administered encounter medications on file as of 04/29/2016.     Allergies as of 04/29/2016 - Review Complete 04/29/2016  Allergen Reaction Noted  . Amoxicillin-pot clavulanate Other (See Comments) 03/25/2016    Past Medical History:  Diagnosis Date  . Adrenal nodule ([facility] . Asthma   . Bladder mass   . Bladder neoplasm   . Cancer ([facility] . Crohn's disease ([facility] . Diverticulitis   . Hypertension   . Over weight   . Skin lesion   . Weak urinary stream     Past Surgical History:  Procedure Laterality Date  . APPENDECTOMY    . CHOLECYSTECTOMY    . TONSILLECTOMY      Family History  Problem Relation Age of Onset  . Adopted: Yes  . Bladder Cancer Neg Hx   . Kidney cancer Neg Hx   . Prostate  cancer Neg Hx   . Allergic rhinitis Neg Hx   . Angioedema Neg Hx   . Asthma Neg Hx   . Eczema Neg Hx   . Urticaria Neg Hx   . Immunodeficiency Neg Hx     Social History   Social History  . Marital status: Married    Spouse name: N/A  . Number of children: N/A  . Years of education: N/A   Occupational History  . Not on file.   Social History Main Topics  . Smoking status: Current Every Day Smoker    Packs/day: 0.50    Years: 50.00    Types: Cigarettes  . Smokeless tobacco: Never Used     Comment: Smokes 3 a day  . Alcohol use 0.0 oz/week     Comment: occasional  . Drug use: No  . Sexual  activity: Not on file   Other Topics Concern  . Not on file   Social History Narrative   Patient is adopted   Married, lives with spouse   2 children (boy and girl)   OCCUPATION: Dealer x38yr.  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  HENT: Negative.   Eyes: Negative for blurred vision.  Respiratory: as per HPI  Cardiovascular: Negative for chest pain and palpitations.  Gastrointestinal: Negative for vomiting, diarrhea, blood per rectum. Genitourinary: Negative for dysuria, urgency, frequency and hematuria.  Musculoskeletal: Negative for myalgias, back pain and joint pain.  Skin: Negative for itching and rash.  Neurological: Negative for dizziness, tremors, focal weakness, seizures and loss of consciousness.  Endo/Heme/Allergies: Negative for environmental allergies.  Psychiatric/Behavioral: Negative for depression, suicidal ideas and hallucinations.  All other systems reviewed and are negative.   Physical Exam: Blood pressure 132/74, pulse 80, height 5' 8"  (1.727 m), weight 174 lb 9.6 oz (79.2 kg), SpO2 98 %. Gen:      No acute distress HEENT:  EOMI, sclera anicteric Neck:     No masses; no thyromegaly Lungs:    B/L exp wheeze, no crackles CV:         Regular rate and rhythm; no murmurs Abd:      + bowel sounds; soft, non-tender; no palpable masses, no distension Ext:    No edema; adequate peripheral perfusion Skin:      Warm and dry; no rash Neuro: alert and oriented x 3 Psych: normal mood and affect  Data Reviewed: Imaging CXR 03/25/2016 Abnormal markings at the lung bases with slight volume loss. This could represent chronic scarring/ fibrosis or mild bibasilar pneumonia.  CT scan 04/22/16 No evidence of interstitial lung disease, mild bronchiectasis bases, bronchial wall thickening, mild emphysema. Images reviewed.  Spirometry  04/08/16 FVC 2.68 [6%) FEV1 2.03 [72%), post bronchodilator 2.30 [82%], + 13% F/F 75 Minimal obstruction, positive bronchial dilator response  Allergy testing: Positive for trees, weeds, mold, dust mite.  FENO 04/09/16- 80  Assessment:  Allergic rhinitis, asthma.  He recently finished a prednisone taper but is still symptomatic. I'll give him another prednisone taper. Change Advair to Symbicort. Check labs for CBC with differential for eosinophilia., Blood allergy profile.  He has significant postnasal drip is contributing to his symptoms. He is on Zyrtec and Allegra that's not helping. I'll stop these and put him on first generation antihistamine such as chlorpheniramine. He'll continue the Astelin and Flonase nasal spray.  Bronchiectasis  Add flutter valve for clearance of secretions. Check IgE levels and aspergillus antibodies to evaluate for ABPA.  COPD Emphysema seen on recent imaging of the chest. He has mild obstruction on spirometry and will need full set of PFTs. However I will hold off until he is over this acute episode. Add spiriva.    Plan/Recommendations: - Change advair to symbicort. Add spiriva. - Stop zyrtec and allegra. Add chlorpheniramine 8 mg tid. - Flutter valve - Check  CBC with differential, Total IgE, Blood allergy profile and ab panel to aspergillus. - Pred taper at 40 mg. Reduce dose 10 mg by every 4 days,  Return in 1 month.  Marshell Garfinkel MD [facility] Pager [phone] 04/29/2016, 3:05 PM  CC: Theotis Burrow*

## 2016-04-30 ENCOUNTER — Telehealth: Payer: Self-pay

## 2016-04-30 ENCOUNTER — Telehealth: Payer: Self-pay | Admitting: Pulmonary Disease

## 2016-04-30 LAB — IGE: IGE (IMMUNOGLOBULIN E), SERUM: 81 kU/L (ref <115)

## 2016-04-30 MED ORDER — TIOTROPIUM BROMIDE MONOHYDRATE 18 MCG IN CAPS: 18.0000 ug | ORAL_CAPSULE | Freq: Every day | RESPIRATORY_TRACT | 6 refills | Status: DC

## 2016-04-30 MED ORDER — PREDNISONE 10 MG PO TABS: ORAL_TABLET | ORAL | 0 refills | Status: DC

## 2016-04-30 MED ORDER — FLUTTER DEVI: 0 refills | Status: DC

## 2016-04-30 NOTE — Telephone Encounter (Signed)
PA for Spiriva Respimat initiated through [facility]. Key: [id number]  The plan will fax you a determination, typically within 1 to 5 business days.  Awaiting response.

## 2016-04-30 NOTE — Telephone Encounter (Signed)
Insurance states that it will not cover the sprivia Respimat only the [place]

## 2016-04-30 NOTE — Telephone Encounter (Signed)
Per 04/29/16 OV: We will change the Advair to Symbicort. He'll be given another prednisone taper starting at 40 mg. Reduce dose to 10 mg every 4 days. Stop taking the Zyrtec and Allegra. We'll start him chlorpheniramine 8 mg 3 times daily. Continue the Astelin and Flonase nasal spray. We will repeat blood tests to check for the allergy profile. ---  Rx for prednisone has been sent in. Nothing further needed

## 2016-04-30 NOTE — Progress Notes (Signed)
Patient seen in the office today and instructed on use of Symbicort, Spiriva, Flutter valve.  Patient expressed understanding and demonstrated technique. Parke Poisson [facility]  04/29/16

## 2016-04-30 NOTE — Telephone Encounter (Signed)
Called and spoke with pt and made him aware that his insurance will not cover the spiriva respimat, but will cover the spiriva handihaler.  This has been sent to the pharmacy.  I did send the initial rx in under RA, but I called and had the pharmacy change this to PM.

## 2016-04-30 NOTE — Telephone Encounter (Signed)
Please advise Dr. Vaughan Browner thanks

## 2016-04-30 NOTE — Telephone Encounter (Signed)
Spiriva Handihaler is OK

## 2016-05-04 LAB — ASPERGILLUS IGE PANEL
Aspergillus Amstel/glauc IgE: <0.35 kU/L (ref <0.35)
Aspergillus nidulans IgE: <0.35 kU/L (ref <0.35)
CLASS: 0
CLASS: 0
CLASS: 0
CLASS: 0
CLASS: 0
Class: 0
Class: 0
Results Received-AspIgE: <0.1 kU/L (ref <0.35)

## 2016-05-08 ENCOUNTER — Telehealth: Payer: Self-pay | Admitting: Pulmonary Disease

## 2016-05-08 NOTE — Telephone Encounter (Signed)
Called made pt aware of results.  He reports he can be reached at [phone] Please advise Dr. Vaughan Browner thanks

## 2016-05-08 NOTE — Telephone Encounter (Signed)
Please let the patient know that he has elevated eosinophils in blood. This indicates that he may be a candidate for specific biologic therapies for asthma like nucala and cinqair. I will call him on Monday and discuss further about these.

## 2016-05-08 NOTE — Telephone Encounter (Signed)
Called spoke with pt. He reports he received his labs via mychart. Requesting further explanation. Please advise thanks

## 2016-05-11 NOTE — Telephone Encounter (Signed)
I called and updated the patient about the CBC with eosinophilia. He feels much better now. We will revaluate in office later this month and discuss further if he wants to go on IL 5 therapy.

## 2016-06-03 ENCOUNTER — Ambulatory Visit (INDEPENDENT_AMBULATORY_CARE_PROVIDER_SITE_OTHER): Payer: PRIVATE HEALTH INSURANCE | Admitting: Pulmonary Disease

## 2016-06-03 ENCOUNTER — Encounter: Payer: Self-pay | Admitting: Pulmonary Disease

## 2016-06-03 VITALS — BP 124/66 | HR 84 | Ht 68.0 in | Wt 181.4 lb

## 2016-06-03 DIAGNOSIS — J4540 Moderate persistent asthma, uncomplicated: Secondary | ICD-10-CM

## 2016-06-03 NOTE — Progress Notes (Signed)
Caleb Evans    [id number]    1951-04-18  Primary Care Physician:Adrian Ladoris Gene, MD  Referring Physician: Theotis Burrow, MD [address] [place], [place] [address]  Chief complaint:  Follow up dyspnea, asthma.  HPI: Caleb Evans is a 65 year old with past medical history of asthma. He was diagnosed about 20 years ago. He had been maintained on Advair since 2001. He has history of seasonal allergies, rhinitis, postnasal drip. His symptoms are usually worse in spring and fall. He had been using Flonase and Zyrtec for his allergies. He also has reflux, heartburn symptoms and is on Nexium for this.   He has had a rough year after moving to [place] from [place]. His symptoms have been exacerbated from summer to fall with the pollen and required multiple rounds of prednisone. He feels about 70% better but still has some dyspnea with wheezing. He uses his albuterol about once a day and continues on Symbicort and Spiriva.  He used to work in the Research officer, trade union at [employer] was exposed to significant dust, asbestos, chemicals at the [employer] in September 2011.  Outpatient Encounter Prescriptions as of 06/03/2016  Medication Sig  . albuterol (PROAIR HFA) 108 (90 Base) MCG/ACT inhaler Inhale 1-2 puffs into the lungs every 6 (six) hours as needed for wheezing or shortness of breath.  Marland Kitchen azelastine (ASTELIN) 0.1 % nasal spray Place 2 sprays into both nostrils 2 (two) times daily. Use in each nostril as directed  . budesonide-formoterol (SYMBICORT) 160-4.5 MCG/ACT inhaler Inhale 2 puffs into the lungs 2 (two) times daily.  . cetirizine (ZYRTEC) 10 MG tablet Take 10 mg by mouth daily.  . Esomeprazole Magnesium (NEXIUM PO) Take by mouth.  . fexofenadine (ALLEGRA) 180 MG tablet Take 1 tablet (180 mg total) by mouth daily.  . fluticasone (FLONASE) 50 MCG/ACT nasal spray Place 2 sprays into both nostrils daily.  . montelukast (SINGULAIR) 10 MG tablet Take 1  tablet (10 mg total) by mouth at bedtime.  . Multiple Vitamin (MULTIVITAMIN WITH MINERALS) TABS tablet Take 1 tablet by mouth daily.  . Olopatadine HCl (PATADAY) 0.2 % SOLN Place 1 drop into both eyes daily as needed.  Marland Kitchen Respiratory Therapy Supplies (FLUTTER) DEVI Use as directed.  . tiotropium (SPIRIVA) 18 MCG inhalation capsule Place 1 capsule (18 mcg total) into inhaler and inhale daily.  . [DISCONTINUED] fluticasone furoate-vilanterol (BREO ELLIPTA) 200-25 MCG/INH AEPB Inhale 1 puff into the lungs daily. (Patient not taking: Reported on 06/03/2016)  . [DISCONTINUED] Fluticasone-Salmeterol (ADVAIR DISKUS) 250-50 MCG/DOSE AEPB Inhale 1 puff into the lungs 2 (two) times daily.   . [DISCONTINUED] predniSONE (DELTASONE) 10 MG tablet Take 4 tabs x 4 days, 3 tabs x 4 days, 2 tabs x 4 days, 1 tab x 4 days then stop (Patient not taking: Reported on 06/03/2016)   No facility-administered encounter medications on file as of 06/03/2016.     Allergies as of 06/03/2016 - Review Complete 06/03/2016  Allergen Reaction Noted  . Amoxicillin-pot clavulanate Other (See Comments) 03/25/2016    Past Medical History:  Diagnosis Date  . Adrenal nodule ([place])   . Asthma   . Bladder mass   . Bladder neoplasm   . Cancer ([place])   . Crohn's disease ([place])   . Diverticulitis   . Hypertension   . Over weight   . Skin lesion   . Weak urinary stream     Past Surgical History:  Procedure Laterality Date  .  APPENDECTOMY    . CHOLECYSTECTOMY    . TONSILLECTOMY      Family History  Problem Relation Age of Onset  . Adopted: Yes  . Bladder Cancer Neg Hx   . Kidney cancer Neg Hx   . Prostate cancer Neg Hx   . Allergic rhinitis Neg Hx   . Angioedema Neg Hx   . Asthma Neg Hx   . Eczema Neg Hx   . Urticaria Neg Hx   . Immunodeficiency Neg Hx     Social History   Social History  . Marital status: Married    Spouse name: N/A  . Number of children: N/A  . Years of education: N/A   Occupational  History  . Not on file.   Social History Main Topics  . Smoking status: Current Every Day Smoker    Packs/day: 0.50    Years: 50.00    Types: Cigarettes  . Smokeless tobacco: Never Used     Comment: Smokes 3 a day  . Alcohol use 0.0 oz/week     Comment: occasional  . Drug use: No  . Sexual activity: Not on file   Other Topics Concern  . 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  HENT: Negative.   Eyes: Negative for blurred vision.  Respiratory: as per HPI  Cardiovascular: Negative for chest pain and palpitations.  Gastrointestinal: Negative for vomiting, diarrhea, blood per rectum. Genitourinary: Negative for dysuria, urgency, frequency and hematuria.  Musculoskeletal: Negative for myalgias, back pain and joint pain.  Skin: Negative for itching and rash.  Neurological: Negative for dizziness, tremors, focal weakness, seizures and loss of consciousness.  Endo/Heme/Allergies: Negative for environmental allergies.  Psychiatric/Behavioral: Negative for depression, suicidal ideas and hallucinations.  All other systems reviewed and are negative.   Physical Exam: Blood pressure 132/74, pulse 80, height 5' 8"  (1.727 m), weight 174 lb 9.6 oz (79.2 kg), SpO2 98 %. Gen:      No acute distress HEENT:  EOMI, sclera anicteric Neck:     No masses; no thyromegaly Lungs:    B/L exp wheeze, no crackles CV:         Regular rate and rhythm; no murmurs Abd:      + bowel sounds; soft, non-tender; no palpable masses, no distension Ext:    No edema; adequate peripheral perfusion Skin:      Warm and dry; no rash Neuro: alert and oriented x 3 Psych: normal mood and affect  Data Reviewed: Imaging CXR 03/25/2016 Abnormal  markings at the lung bases with slight volume loss. This could represent chronic scarring/ fibrosis or mild bibasilar pneumonia.  CT scan 04/22/16 No evidence of interstitial lung disease, mild bronchiectasis bases, bronchial wall thickening, mild emphysema. Images reviewed.  Spirometry 04/08/16 FVC 2.68 [6%) FEV1 2.03 [72%), post bronchodilator 2.30 [82%], + 13% F/F 75 Minimal obstruction, positive bronchial dilator response  Allergy testing: Positive for trees, weeds, mold, dust mite.  FENO 04/09/16- 80  Labs 04/29/16 Aspergillus ab - negative IgE- 81 CBC with diff- WBC count 12.3, absolute eosinophil count 1242/microL  Assessment:  Allergic rhinitis, Eosinophilic asthma.  He is starting to finally feel better after 2  rounds of prednisone. He continues on the Symbicort and Spiriva. He states that the chlorpheniramine is helping with his postnasal drip. He'll continue the Astelin and Flonase nasal spray.  He has markedly elevated peripheral basophil count. He'll be a good candidate for anti-IL-5 therapy. I'll start the paperwork for any Nucala  Bronchiectasis Continue flutter valve for clearance of secretions. There is no evidence of ABPA as IgE levels and aspergillus antibodies are negative  COPD Emphysema seen on recent imaging of the chest. He has mild obstruction on spirometry and will need full set of PFTs. However I will hold off until he is back to baseline.    Plan/Recommendations: - Continue advair to symbicort. Add spiriva. - Continue chlorpheniramine 8 mg tid. - Flutter valve - Start paperwork for nucala.  Return in 3 month.  Marshell Garfinkel MD [facility] Pager [phone] 06/03/2016, 2:12 PM  CC: Theotis Burrow*

## 2016-06-03 NOTE — Patient Instructions (Signed)
Continue using your asthma therapies as prescribed. We will start the paperwork for Nucala injections.  Return in 3 months

## 2016-06-23 ENCOUNTER — Ambulatory Visit: Payer: PRIVATE HEALTH INSURANCE | Admitting: Allergy & Immunology

## 2016-06-30 ENCOUNTER — Other Ambulatory Visit: Payer: Self-pay | Admitting: Allergy

## 2016-06-30 ENCOUNTER — Ambulatory Visit (INDEPENDENT_AMBULATORY_CARE_PROVIDER_SITE_OTHER): Payer: PRIVATE HEALTH INSURANCE | Admitting: Allergy & Immunology

## 2016-06-30 ENCOUNTER — Encounter: Payer: Self-pay | Admitting: Allergy & Immunology

## 2016-06-30 VITALS — BP 130/90 | HR 76 | Temp 98.0°F

## 2016-06-30 DIAGNOSIS — Z720 Tobacco use: Secondary | ICD-10-CM | POA: Diagnosis not present

## 2016-06-30 DIAGNOSIS — J3089 Other allergic rhinitis: Secondary | ICD-10-CM

## 2016-06-30 DIAGNOSIS — K219 Gastro-esophageal reflux disease without esophagitis: Secondary | ICD-10-CM | POA: Diagnosis not present

## 2016-06-30 DIAGNOSIS — H1010 Acute atopic conjunctivitis, unspecified eye: Secondary | ICD-10-CM

## 2016-06-30 DIAGNOSIS — J4550 Severe persistent asthma, uncomplicated: Secondary | ICD-10-CM | POA: Diagnosis not present

## 2016-06-30 DIAGNOSIS — J309 Allergic rhinitis, unspecified: Principal | ICD-10-CM

## 2016-06-30 MED ORDER — EPINEPHRINE 0.3 MG/0.3ML IJ SOAJ: 0.3000 mg | Freq: Once | INTRAMUSCULAR | 1 refills | Status: AC

## 2016-06-30 NOTE — Progress Notes (Signed)
FOLLOW UP  Date of Service/Encounter:  06/30/16   Assessment:   Chronic nonseasonal allergic rhinitis due to fungal spores  Severe persistent asthma, uncomplicated  Gastroesophageal reflux disease, esophagitis presence not specified  Tobacco use   Asthma Reportables:  Severity: severe persistent  Risk: high Control: well controlled  Seasonal Influenza Vaccine: no but encouraged    Plan/Recommendations:   1. Chronic allergic rhinitis  - Continue with Flonase two sprays per nostril daily, Astelin 2 sprays per nostril twice daily, Pataday eye drops as needed, and Allegra 177m tablet daily. - Since his symptoms are not well controlled with current regimen, the next step is certainly allergen immunotherapy. - This can help control his allergy symptoms as well as his asthma. - We did discuss allergen immunotherapy, including the risk and benefits. - Patient would like to proceed at this time. - Immunotherapy consent signed. - Epinephrine autoinjector sent in. - We will get the immunotherapy made and give his first short in two weeks.   2. Severe persistent asthma, uncomplicated - Lung testing appeared stable today. - We did have a long discussion about Nucala, including its risks and manufacturing process. - The patient is very hesitant to start Nucala, as he [place]. - We did discuss FDA manufacturing requirements and quality control processes, which rendered the drug relatively safe. - However, he declines to start Nucala at this time. - Daily controller medication(s): Symbicort 160/45 two puffs twice daily with spacer + Spiriva 163m capsule once daily - Rescue medications: ProAir 4 puffs every 4-6 hours as needed - Asthma control goals:  * Full participation in all desired activities (may need albuterol before activity) * Albuterol use two time or less a week on average (not counting use with activity) * Cough interfering  with sleep two time or less a month * Oral steroids no more than once a year * No hospitalizations  3. Gastroesophageal reflux disease - Continue with Nexium daily.  4. Tobacco use - I provided positive reinforcement for his efforts to quit smoking.   5. Return in about 3 months (around 09/30/2016).      Subjective:   Caleb Hershmans a [age].o. male presenting today for follow up of  Chief Complaint  Patient presents with  . Follow-up    Discuss Nucala  .  Caleb Dantonioas a history of the following: Patient Active Problem List   Diagnosis Date Noted  . Severe persistent asthma 04/08/2016  . Esophageal reflux 04/08/2016  . Tobacco use 04/08/2016  . Allergic rhinitis 01/17/2014  . COPD, mild ([facility]06/05/2014  . Mild chronic obstructive pulmonary disease (HCAntw[facility]06/05/2014    History obtained from: chart review and patient.  Caleb Evans referred by AdElyse JarvisMD.     LaMerlons a [age].o. male presenting for a follow up visit. He was last seen in August 2017 in his first visit with Dr. [place]elva Bushn [facility] [place]. At that time, he was presenting for an allergy evaluation with ocular symptoms, postnasal drip as well as headaches and wheezing. He has been on IT in the past in the 1960s. Testing was positive for trees, weeds, mold, and dust mite. At that time, he was started on Allegra 180 mg daily, nasal saline rinses, Astelin 2 sprays per nostril twice daily, Flonase 2 sprays per nostril daily, Pataday eyedrops when necessary, as well as Singulair 10 mg. For his asthma, he was changed from AdSan [place]izario  to Breo 200/25 one puff once daily.   Since the last visit, he continues to have problems with wheezing and allergies. He was taking Allegra, but ran out and has not done more. Therefore, he has resumed his cetirizine daily. He has also been using the nasal sprays fairly frequently, but reports that they're causing sneezing which limit their use. He does use  eyedrops as needed as well. He does feel that his symptoms are 30% controlled.  From an asthma perspective, he is doing fairly well. His pulmonologist discussed starting Nucala with him. His last absolute eosinophil count was 1200 within the last couple of months. He is very hesitant to start [place] because he does not trust the [place] and there manufacturing process. I'm not entirely sure that [place] is made in [place] and in fact I believe it's made and the [place]. I did share this thought with him, to which he answers "but it is made in [place]!". 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 He had the ginger tested and it came back positive for uranium, lead, and plutonium amongst other contaminants. In any case, he is not interested in [place] whatsoever.   Caleb Evans is followed by Dr. Marshell Garfinkel at [facility]. He has had asthma for 30+ years and actually worked at the site of the [employer] following its destruction on 9/11. He has a history of asthma and has had a rough time since moving from [place] to [place] within the last year. He is currently on Symbicort 160/4.5 two puffs in the morning and two puffs at night as well as Spiriva one 40mg capsule daily. He also has a history of reflux and is on Nexium.  Otherwise, there have been no changes to his past medical history, surgical history, family history, or social history. He lives around two miles from this office and would like to get his immunotherapy here.     Review of Systems: a 14-point review of systems is pertinent for what is mentioned in HPI.  Otherwise, all other systems were negative. Constitutional: negative other than that listed in the HPI Eyes: negative other than that listed in the HPI Ears, nose, mouth, throat, and face: negative other than that listed in the HPI Respiratory: negative other than that listed in the HPI Cardiovascular: negative other than that  listed in the HPI Gastrointestinal: negative other than that listed in the HPI Genitourinary: negative other than that listed in the HPI Integument: negative other than that listed in the HPI Hematologic: negative other than that listed in the HPI Musculoskeletal: negative other than that listed in the HPI Neurological: negative other than that listed in the HPI Allergy/Immunologic: negative other than that listed in the HPI    Objective:   Blood pressure 130/90, pulse 76, temperature 98 F (36.7 C), temperature source Oral, SpO2 98 %. There is no height or weight on file to calculate BMI.   Physical Exam:  General: Alert, interactive, in no acute distress. Coopeative with the exam. Very skeptical hesitant male.  HEENT: TMs pearly gray, turbinates edematous and pale with clear discharge, post-pharynx erythematous. Neck: Supple without thyromegaly. Lungs: Clear to auscultation without wheezing, rhonchi or rales. No increased work of breathing. CV: Normal S1/S2, no murmurs. Capillary refill <2 seconds.  Abdomen: Nondistended, nontender. No guarding or rebound tenderness. Bowel sounds present in all fields and hyperactive  Skin: Warm and dry, without lesions or rashes. Extremities:  No  clubbing, cyanosis or edema. Neuro:   Grossly intact.  Diagnostic studies:  Spirometry: results abnormal (FEV1: 2.13/69%, FVC: 2.81/71%, FEV1/FVC: 75%).    Spirometry consistent with possible restrictive disease.  Allergy Studies: None    [name], MD [facility] [place]

## 2016-06-30 NOTE — Addendum Note (Signed)
Addended by: Gara Kroner L on: 06/30/2016 02:51 PM   Modules accepted: Orders

## 2016-06-30 NOTE — Patient Instructions (Addendum)
1. Chronic allergic rhinitis  - Continue with Flonase two sprays per nostril daily, Astelin 2 sprays per nostril twice daily, Pataday eye drops as needed, and Allegra 124m tablet daily. - We will start allergy shots since you still have symptoms despite the medications. - Controlling your allergic rhinitis will also help with controlling your asthma.  - Make an appointment in two weeks for the first shot.   2. Severe persistent asthma, uncomplicated - Lung testing appeared stable today. - We will hold off on Nucala since you have concerns about the medication.  - Daily controller medication(s): Symbicort 160/45 two puffs twice daily with spacer + Spiriva 128m capsule once daily - Rescue medications: ProAir 4 puffs every 4-6 hours as needed - Asthma control goals:  * Full participation in all desired activities (may need albuterol before activity) * Albuterol use two time or less a week on average (not counting use with activity) * Cough interfering with sleep two time or less a month * Oral steroids no more than once a year * No hospitalizations  3. Gastroesophageal reflux disease - Continue with Nexium daily.  4. Tobacco use - Good luck with quitting cigarettes completely!   5. Return in about 3 months (around 09/30/2016).  Please inform usKoreaf any Emergency Department visits, hospitalizations, or changes in symptoms. Call usKoreaefore going to the ED for breathing or allergy symptoms since we might be able to fit you in for a sick visit. Feel free to contact usKoreanytime with any questions, problems, or concerns.  It was a pleasure to meet you today!   Websites that have reliable patient information: 1. American Academy of Asthma, Allergy, and Immunology: www.aaaai.org 2. Food Allergy Research and Education (FARE): foodallergy.org 3. Mothers of Asthmatics: http://www.asthmacommunitynetwork.org 4. American College of Allergy, Asthma, and Immunology: www.acaai.org

## 2016-07-01 ENCOUNTER — Other Ambulatory Visit: Payer: PRIVATE HEALTH INSURANCE

## 2016-07-07 NOTE — Progress Notes (Signed)
Vials to be made 07-09-16.jm

## 2016-07-08 ENCOUNTER — Ambulatory Visit: Payer: PRIVATE HEALTH INSURANCE | Admitting: Urology

## 2016-07-08 ENCOUNTER — Encounter: Payer: Self-pay | Admitting: Urology

## 2016-07-08 VITALS — BP 131/78 | HR 68 | Ht 68.0 in | Wt 177.0 lb

## 2016-07-08 DIAGNOSIS — C678 Malignant neoplasm of overlapping sites of bladder: Secondary | ICD-10-CM | POA: Diagnosis not present

## 2016-07-08 DIAGNOSIS — J3089 Other allergic rhinitis: Secondary | ICD-10-CM | POA: Diagnosis not present

## 2016-07-08 LAB — URINALYSIS, COMPLETE
Bilirubin, UA: NEGATIVE
GLUCOSE, UA: NEGATIVE
Ketones, UA: NEGATIVE
LEUKOCYTES UA: NEGATIVE
Nitrite, UA: NEGATIVE
PROTEIN UA: NEGATIVE
RBC, UA: NEGATIVE
Specific Gravity, UA: 1.02 (ref 1.005–1.030)
Urobilinogen, Ur: 0.2 mg/dL (ref 0.2–1.0)
pH, UA: 7 (ref 5.0–7.5)

## 2016-07-08 LAB — MICROSCOPIC EXAMINATION
Bacteria, UA: NONE SEEN
RBC, UA: NONE SEEN /hpf (ref 0–?)

## 2016-07-08 MED ORDER — LIDOCAINE HCL 2 % EX GEL
1.0000 "application " | Freq: Once | CUTANEOUS | Status: AC
  Administered 2016-07-08: 1 via URETHRAL

## 2016-07-08 MED ORDER — CIPROFLOXACIN HCL 500 MG PO TABS
500.0000 mg | ORAL_TABLET | Freq: Once | ORAL | Status: AC
Start: 2016-07-08 — End: 2016-07-08
  Administered 2016-07-08: 500 mg via ORAL

## 2016-07-08 NOTE — Progress Notes (Signed)
   07/08/16  CC:  Chief Complaint  Patient presents with  . 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 He has done well since his last appointment with no complaints or hematuria.    Blood pressure 131/78, pulse 68, height 5' 8"  (1.727 m), weight 177 lb (80.3 kg). NED. A&Ox3.   No respiratory distress   Abd soft, NT, ND Normal phallus with bilateral descended testicles  Cystoscopy Procedure Note  Patient identification was confirmed, informed consent was obtained, and patient was prepped using Betadine solution.  Lidocaine jelly was administered per urethral meatus.    Preoperative abx where received prior to procedure.     Pre-Procedure: - Inspection reveals a normal caliber ureteral meatus.  Procedure: The flexible cystoscope was introduced without difficulty - No urethral strictures/lesions are present. - Enlarged prostate  - Normal bladder neck - Bilateral ureteral orifices identified - Bladder mucosa  reveals no ulcers, tumors, or lesions - No bladder stones - No trabeculation  Retroflexion shows no intravesical lobe   Post-Procedure: - Patient tolerated the procedure well  Assessment/ Plan:  1. 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

## 2016-07-09 ENCOUNTER — Ambulatory Visit: Payer: PRIVATE HEALTH INSURANCE | Admitting: Allergy

## 2016-07-10 ENCOUNTER — Ambulatory Visit: Payer: BLUE CROSS/BLUE SHIELD | Admitting: Pulmonary Disease

## 2016-07-14 ENCOUNTER — Ambulatory Visit (INDEPENDENT_AMBULATORY_CARE_PROVIDER_SITE_OTHER): Payer: BLUE CROSS/BLUE SHIELD | Admitting: *Deleted

## 2016-07-14 DIAGNOSIS — J309 Allergic rhinitis, unspecified: Secondary | ICD-10-CM | POA: Diagnosis not present

## 2016-07-15 NOTE — Progress Notes (Signed)
Immunotherapy   Patient Details  Name: Caleb Evans MRN: [id number] Date of Birth: 23-Aug-1950  07/14/2016  [name] : Start Allergy Injections.  Pollen-DM 0.05cc given.  Blue Vial. Following schedule: B  Frequency: Once Weekly. Epi-Pen: Pt does have Auvi-Q. Consent signed and patient instructions given. Patient waited 45 minutes after injection given and no reaction.   [name] 07/15/2016, 10:37 AM

## 2016-07-17 ENCOUNTER — Encounter: Payer: Self-pay | Admitting: Allergy & Immunology

## 2016-07-21 ENCOUNTER — Ambulatory Visit (INDEPENDENT_AMBULATORY_CARE_PROVIDER_SITE_OTHER): Payer: BLUE CROSS/BLUE SHIELD | Admitting: *Deleted

## 2016-07-21 DIAGNOSIS — J309 Allergic rhinitis, unspecified: Secondary | ICD-10-CM

## 2016-07-28 ENCOUNTER — Ambulatory Visit (INDEPENDENT_AMBULATORY_CARE_PROVIDER_SITE_OTHER): Payer: BLUE CROSS/BLUE SHIELD | Admitting: *Deleted

## 2016-07-28 DIAGNOSIS — J309 Allergic rhinitis, unspecified: Secondary | ICD-10-CM

## 2016-08-18 ENCOUNTER — Ambulatory Visit (INDEPENDENT_AMBULATORY_CARE_PROVIDER_SITE_OTHER): Payer: BLUE CROSS/BLUE SHIELD | Admitting: *Deleted

## 2016-08-18 DIAGNOSIS — J309 Allergic rhinitis, unspecified: Secondary | ICD-10-CM

## 2016-08-25 ENCOUNTER — Ambulatory Visit (INDEPENDENT_AMBULATORY_CARE_PROVIDER_SITE_OTHER): Payer: BLUE CROSS/BLUE SHIELD | Admitting: *Deleted

## 2016-08-25 DIAGNOSIS — J309 Allergic rhinitis, unspecified: Secondary | ICD-10-CM

## 2016-08-26 ENCOUNTER — Ambulatory Visit: Payer: BLUE CROSS/BLUE SHIELD | Admitting: Pulmonary Disease

## 2016-09-01 ENCOUNTER — Ambulatory Visit (INDEPENDENT_AMBULATORY_CARE_PROVIDER_SITE_OTHER): Payer: BLUE CROSS/BLUE SHIELD | Admitting: *Deleted

## 2016-09-01 DIAGNOSIS — J309 Allergic rhinitis, unspecified: Secondary | ICD-10-CM | POA: Diagnosis not present

## 2016-09-08 ENCOUNTER — Ambulatory Visit: Payer: Self-pay | Admitting: *Deleted

## 2016-09-08 DIAGNOSIS — J309 Allergic rhinitis, unspecified: Secondary | ICD-10-CM

## 2016-09-15 ENCOUNTER — Ambulatory Visit (INDEPENDENT_AMBULATORY_CARE_PROVIDER_SITE_OTHER): Payer: BLUE CROSS/BLUE SHIELD | Admitting: *Deleted

## 2016-09-15 DIAGNOSIS — J309 Allergic rhinitis, unspecified: Secondary | ICD-10-CM

## 2016-09-22 ENCOUNTER — Ambulatory Visit (INDEPENDENT_AMBULATORY_CARE_PROVIDER_SITE_OTHER): Payer: BLUE CROSS/BLUE SHIELD | Admitting: *Deleted

## 2016-09-22 DIAGNOSIS — J309 Allergic rhinitis, unspecified: Secondary | ICD-10-CM

## 2016-09-29 ENCOUNTER — Ambulatory Visit (INDEPENDENT_AMBULATORY_CARE_PROVIDER_SITE_OTHER): Payer: BLUE CROSS/BLUE SHIELD | Admitting: *Deleted

## 2016-09-29 DIAGNOSIS — J309 Allergic rhinitis, unspecified: Secondary | ICD-10-CM

## 2016-10-06 ENCOUNTER — Ambulatory Visit (INDEPENDENT_AMBULATORY_CARE_PROVIDER_SITE_OTHER): Payer: BLUE CROSS/BLUE SHIELD | Admitting: *Deleted

## 2016-10-06 DIAGNOSIS — J309 Allergic rhinitis, unspecified: Secondary | ICD-10-CM | POA: Diagnosis not present

## 2016-10-13 ENCOUNTER — Ambulatory Visit (INDEPENDENT_AMBULATORY_CARE_PROVIDER_SITE_OTHER): Payer: BLUE CROSS/BLUE SHIELD | Admitting: *Deleted

## 2016-10-13 DIAGNOSIS — J309 Allergic rhinitis, unspecified: Secondary | ICD-10-CM

## 2016-10-20 ENCOUNTER — Ambulatory Visit (INDEPENDENT_AMBULATORY_CARE_PROVIDER_SITE_OTHER): Payer: PRIVATE HEALTH INSURANCE | Admitting: *Deleted

## 2016-10-20 DIAGNOSIS — J309 Allergic rhinitis, unspecified: Secondary | ICD-10-CM

## 2016-10-27 ENCOUNTER — Ambulatory Visit (INDEPENDENT_AMBULATORY_CARE_PROVIDER_SITE_OTHER): Payer: PRIVATE HEALTH INSURANCE | Admitting: *Deleted

## 2016-10-27 DIAGNOSIS — J309 Allergic rhinitis, unspecified: Secondary | ICD-10-CM

## 2016-11-03 ENCOUNTER — Ambulatory Visit (INDEPENDENT_AMBULATORY_CARE_PROVIDER_SITE_OTHER): Payer: PRIVATE HEALTH INSURANCE | Admitting: *Deleted

## 2016-11-03 DIAGNOSIS — J309 Allergic rhinitis, unspecified: Secondary | ICD-10-CM

## 2016-11-10 ENCOUNTER — Ambulatory Visit (INDEPENDENT_AMBULATORY_CARE_PROVIDER_SITE_OTHER): Payer: PRIVATE HEALTH INSURANCE | Admitting: *Deleted

## 2016-11-10 DIAGNOSIS — J309 Allergic rhinitis, unspecified: Secondary | ICD-10-CM | POA: Diagnosis not present

## 2016-11-17 ENCOUNTER — Ambulatory Visit (INDEPENDENT_AMBULATORY_CARE_PROVIDER_SITE_OTHER): Payer: PRIVATE HEALTH INSURANCE | Admitting: *Deleted

## 2016-11-17 DIAGNOSIS — J309 Allergic rhinitis, unspecified: Secondary | ICD-10-CM | POA: Diagnosis not present

## 2016-11-24 ENCOUNTER — Ambulatory Visit (INDEPENDENT_AMBULATORY_CARE_PROVIDER_SITE_OTHER): Payer: PRIVATE HEALTH INSURANCE | Admitting: *Deleted

## 2016-11-24 DIAGNOSIS — J309 Allergic rhinitis, unspecified: Secondary | ICD-10-CM

## 2016-12-01 ENCOUNTER — Ambulatory Visit (INDEPENDENT_AMBULATORY_CARE_PROVIDER_SITE_OTHER): Payer: PRIVATE HEALTH INSURANCE | Admitting: *Deleted

## 2016-12-01 DIAGNOSIS — J309 Allergic rhinitis, unspecified: Secondary | ICD-10-CM | POA: Diagnosis not present

## 2016-12-08 ENCOUNTER — Ambulatory Visit (INDEPENDENT_AMBULATORY_CARE_PROVIDER_SITE_OTHER): Payer: PRIVATE HEALTH INSURANCE | Admitting: *Deleted

## 2016-12-08 DIAGNOSIS — J309 Allergic rhinitis, unspecified: Secondary | ICD-10-CM | POA: Diagnosis not present

## 2016-12-15 ENCOUNTER — Ambulatory Visit (INDEPENDENT_AMBULATORY_CARE_PROVIDER_SITE_OTHER): Payer: PRIVATE HEALTH INSURANCE | Admitting: *Deleted

## 2016-12-15 DIAGNOSIS — J309 Allergic rhinitis, unspecified: Secondary | ICD-10-CM

## 2016-12-22 ENCOUNTER — Ambulatory Visit (INDEPENDENT_AMBULATORY_CARE_PROVIDER_SITE_OTHER): Payer: PRIVATE HEALTH INSURANCE | Admitting: *Deleted

## 2016-12-22 DIAGNOSIS — J309 Allergic rhinitis, unspecified: Secondary | ICD-10-CM | POA: Diagnosis not present

## 2016-12-29 ENCOUNTER — Ambulatory Visit (INDEPENDENT_AMBULATORY_CARE_PROVIDER_SITE_OTHER): Payer: PRIVATE HEALTH INSURANCE | Admitting: *Deleted

## 2016-12-29 DIAGNOSIS — J309 Allergic rhinitis, unspecified: Secondary | ICD-10-CM

## 2017-01-01 ENCOUNTER — Other Ambulatory Visit: Payer: Self-pay | Admitting: Pulmonary Disease

## 2017-01-01 ENCOUNTER — Other Ambulatory Visit: Payer: Self-pay

## 2017-01-01 DIAGNOSIS — H1010 Acute atopic conjunctivitis, unspecified eye: Secondary | ICD-10-CM

## 2017-01-01 DIAGNOSIS — J4550 Severe persistent asthma, uncomplicated: Secondary | ICD-10-CM

## 2017-01-01 DIAGNOSIS — J309 Allergic rhinitis, unspecified: Principal | ICD-10-CM

## 2017-01-01 MED ORDER — MONTELUKAST SODIUM 10 MG PO TABS: 10.0000 mg | ORAL_TABLET | Freq: Every day | ORAL | 0 refills | Status: DC

## 2017-01-01 NOTE — Telephone Encounter (Signed)
Received fax from [facility] in regards to a refill for montelukast. I sent in 1 refill with no extra refills. patinet was last seen on 06/30/16. Patient needs an office visit for further refills.

## 2017-01-05 ENCOUNTER — Telehealth: Payer: Self-pay | Admitting: Allergy & Immunology

## 2017-01-05 ENCOUNTER — Ambulatory Visit (INDEPENDENT_AMBULATORY_CARE_PROVIDER_SITE_OTHER): Payer: PRIVATE HEALTH INSURANCE | Admitting: *Deleted

## 2017-01-05 DIAGNOSIS — J309 Allergic rhinitis, unspecified: Secondary | ICD-10-CM

## 2017-01-05 NOTE — Telephone Encounter (Signed)
Please call pt wife back regarding billing received with a contractual write off listed. Pt is confused and had a few questions.

## 2017-01-05 NOTE — Telephone Encounter (Signed)
Made adjustments - called with new balance - kt

## 2017-01-12 ENCOUNTER — Encounter: Payer: Self-pay | Admitting: Allergy & Immunology

## 2017-01-12 ENCOUNTER — Ambulatory Visit (INDEPENDENT_AMBULATORY_CARE_PROVIDER_SITE_OTHER): Payer: PRIVATE HEALTH INSURANCE | Admitting: Allergy & Immunology

## 2017-01-12 VITALS — BP 112/70 | HR 82 | Temp 97.7°F | Resp 16 | Ht 66.73 in | Wt 179.4 lb

## 2017-01-12 DIAGNOSIS — J0100 Acute maxillary sinusitis, unspecified: Secondary | ICD-10-CM | POA: Diagnosis not present

## 2017-01-12 DIAGNOSIS — J309 Allergic rhinitis, unspecified: Secondary | ICD-10-CM | POA: Diagnosis not present

## 2017-01-12 DIAGNOSIS — J3089 Other allergic rhinitis: Secondary | ICD-10-CM | POA: Diagnosis not present

## 2017-01-12 DIAGNOSIS — J4550 Severe persistent asthma, uncomplicated: Secondary | ICD-10-CM

## 2017-01-12 MED ORDER — FLUTICASONE FUROATE-VILANTEROL 200-25 MCG/INH IN AEPB: 1.0000 | INHALATION_SPRAY | Freq: Every day | RESPIRATORY_TRACT | 3 refills | Status: DC

## 2017-01-12 MED ORDER — FLUTICASONE PROPIONATE 93 MCG/ACT NA EXHU: 1.0000 | INHALANT_SUSPENSION | Freq: Two times a day (BID) | NASAL | 3 refills | Status: DC

## 2017-01-12 NOTE — Patient Instructions (Addendum)
1. Severe persistent asthma with exacerbation secondary to an acute sinus infection - Lung function did not look perfect today, but it did improve with the nebulizer use.  - Stop the Spiriva since this does not seem to be working.  - Stop the Symbicort and start Breo 200/25 one puff once daily (sample provided).  - We will work on getting this approved with your union.  - We will consider starting Nucala in the future, although coverage might not be great.  - Daily controller medication(s): Breo 200/25 one puff once daily + Singulair (montelukast) 62m daily - Rescue medications: albuterol 4 puffs every 4-6 hours as needed - Asthma control goals:  * Full participation in all desired activities (may need albuterol before activity) * Albuterol use two time or less a week on average (not counting use with activity) * Cough interfering with sleep two time or less a month * Oral steroids no more than once a year * No hospitalizations  2. Acute non-recurrent maxillary sinusitis - Start the steroid pack provided. - Continue with nasal saline rinses. - Add Mucinex twice daily to help thin out mucous and improve sinus drainage. - Call uKoreain 2-3 days if there is no improvement.   3. Chronic nonseasonal allergic rhinitis - We will put in that prescription for Xhance: one puff twice daily - This will provide two months of free medication, which should give uKoreatime to see if this is effective.  - Continue with allergy shots at the same schedule.   4. Return in about 3 months (around 04/14/2017).  Please inform uKoreaof any Emergency Department visits, hospitalizations, or changes in symptoms. Call uKoreabefore going to the ED for breathing or allergy symptoms since we might be able to fit you in for a sick visit. Feel free to contact uKoreaanytime with any questions, problems, or concerns.  It was a pleasure to see you again today! Happy summer!   Websites that have reliable patient information: 1. American  Academy of Asthma, Allergy, and Immunology: www.aaaai.org 2. Food Allergy Research and Education (FARE): foodallergy.org 3. Mothers of Asthmatics: http://www.asthmacommunitynetwork.org 4. American College of Allergy, Asthma, and Immunology: www.acaai.org

## 2017-01-12 NOTE — Progress Notes (Signed)
FOLLOW UP  Date of Service/Encounter:  01/12/17   Assessment:   Severe persistent asthma, uncomplicated  Acute non-recurrent maxillary sinusitis   Chronic nonseasonal allergic rhinitis (trees, weeds, mold, dust mite)  Non-adherence with medication regimen  Current smoker  Previous World Location manager   Asthma Reportables:  Severity: severe persistent  Risk: high Control: not well controlled   Plan/Recommendations:    1. Severe persistent asthma with exacerbation secondary to an acute sinus infection - Lung function did not look perfect today, but it did improve with the nebulizer use.  - Stop the Spiriva since this does not seem to be working.  - Stop the Symbicort and start Breo 200/25 one puff once daily (sample provided).  - We will work on getting this approved with your union.  - We will consider starting Nucala in the future, although coverage might not be great.  - Daily controller medication(s): Breo 200/25 one puff once daily + Singulair (montelukast) 76m daily - Rescue medications: albuterol 4 puffs every 4-6 hours as needed - Asthma control goals:  * Full participation in all desired activities (may need albuterol before activity) * Albuterol use two time or less a week on average (not counting use with activity) * Cough interfering with sleep two time or less a month * Oral steroids no more than once a year * No hospitalizations  2. Acute non-recurrent maxillary sinusitis - Start the steroid pack provided. - Continue with nasal saline rinses. - Add Mucinex twice daily to help thin out mucous and improve sinus drainage. - Call uKoreain 2-3 days if there is no improvement.   3. Chronic nonseasonal allergic rhinitis - We will put in that prescription for Xhance: one puff twice daily - This will provide two months of free medication, which should give uKoreatime to see if this is effective.  - Continue with allergy shots at the same schedule.     4. Return in about 3 months (around 04/14/2017).   Subjective:   Caleb Evans a 66y.o. male presenting today for follow up of  Chief Complaint  Patient presents with  . Asthma  . Cough    productive- greenish yellow  . Breathing Problem    Caleb Wilseyhas a history of the following: Patient Active Problem List   Diagnosis Date Noted  . Severe persistent asthma 04/08/2016  . Esophageal reflux 04/08/2016  . Tobacco use 04/08/2016  . Allergic rhinitis 01/17/2014  . COPD, mild ([place]North [place] 01/17/2014  . Mild chronic obstructive pulmonary disease (HNorth [place] 01/17/2014    History obtained from: chart review and patient.  Caleb Champagnewas referred by Caleb, AElyse Jarvis Evans.  LKaireis a 66y.o. male presenting for a follow up visit.  He was last seen for an office visit in November 2017. At that time, continue him on all of his medications including Flonase 2 sprays per nostril daily, Astelin 2 sprays per nostril twice daily, Pataday 1 drop per eye twice daily as needed, and Allegra 180 mg daily. His symptoms were not well controlled, therefore we started him on allergen immunotherapy. He receives one vial containing pollens and dust mite. Currently he is receiving 0.1 mL of the red vial. He is on Schedule A. his lung function was stable. He was continued on Symbicort 160/4.5 g 2 puffs twice daily as well as Spiriva 18 g capsule once daily. There was discussion about starting new Nucala, but he declined.  Since the last visit, he  has mostly done well. Over he last two weeks, he has had worsening breathing problems.  The crape myrtles seem to have triggered his symptoms, according to the patient. He also started mowing his lawn which seems to have worsened things. He has had phlegm production, headaches that have worsened in the last two weeks. Following the rain, "it was bad". Over the last couple of weeks, he has been mixing in ProAir (one puff 4-5 times daily) with his  Symbicort two puffs twice daily. He is only using Spiriva intermittently.   Prior to two weeks ago, he was "gummy" and coughing when he "smelled grass". The scents from the perfumes also tend to set him off. He has been sleeping better in the last couple of months until recently.   Asthma/Respiratory Symptom History: At baseline, he is using Symbicort two puffs twice daily. He is using this continuously. He is on Spiriva 62m once daily, although he does not use this every day. He does not feel that this works at all, but he has intermittent tried to see if it works. Of note, he is a smoker but has decreased to 10 cigarettes per day. He is on montelukast and has not noticed here. He is followed [facility] PMarshell [facility]inkel[facility] He has declined Nucala in the past. He has failed Advair and failed Symbicort now. He would like to try something else.   Allergic Rhinitis Symptom History:  He is on allergy shots and has reached maintenance. He does feel that he was worse one year ago before starting the shots. He continues with the fluticasone nasal spray. He does not use nasal saline rinses regularly. He is not on any other nasal sprays at this time. He does not need antibiotics often at all. In fact he cannot remember the last time that he needed them.   Otherwise, there have been no changes to his past medical history, surgical history, family history, or social history.    Review of Systems: a 14-point review of systems is pertinent for what is mentioned in HPI.  Otherwise, all other systems were negative. Constitutional: negative other than that listed in the HPI Eyes: negative other than that listed in the HPI Ears, nose, mouth, throat, and face: negative other than that listed in the HPI Respiratory: negative other than that listed in the HPI Cardiovascular: negative other than that listed in the HPI Gastrointestinal: negative other than that listed in the HPI Genitourinary: negative  other than that listed in the HPI Integument: negative other than that listed in the HPI Hematologic: negative other than that listed in the HPI Musculoskeletal: negative other than that listed in the HPI Neurological: negative other than that listed in the HPI Allergy/Immunologic: negative other than that listed in the HPI    Objective:   Blood pressure 112/70, pulse 82, temperature 97.7 F (36.5 C), temperature source Oral, resp. rate 16, height 5' 6.73" (1.695 m), weight 179 lb 6.4 oz (81.4 kg), SpO2 94 %. Body mass index is 28.32 kg/m.   Physical Exam:  General: Alert, interactive, in no acute distress. Pleasant male.  Eyes: No conjunctival injection present on the right, No conjunctival injection present on the left, PERRL bilaterally, No discharge on the right, No discharge on the left and No Horner-Trantas dots present Ears: Right TM pearly gray with normal light reflex, Left TM pearly gray with normal light reflex, Right TM intact without perforation and Left TM intact without perforation.  Nose/Throat: External nose within normal  limits and septum midline, turbinates markedly edematous with clear discharge, post-pharynx markedly erythematous without cobblestoning in the posterior oropharynx. Tonsils 2+ without exudates. Sinus tenderness in the bilateral maxillary sinuses as well as the frontal sinuses.  Neck: Supple without thyromegaly. Lungs: Clear to auscultation without wheezing, rhonchi or rales. No increased work of breathing. CV: Normal S1/S2, no murmurs. Capillary refill <2 seconds.  Skin: Warm and dry, without lesions or rashes. Neuro:   Grossly intact. No focal deficits appreciated. Responsive to questions.   Diagnostic studies:   Spirometry: results normal (FEV1: 2.01/68%, FVC: 2.68/71%, FEV1/FVC: 74%).    Spirometry consistent with possible restrictive disease. Albuterol/Atrovent nebulizer treatment given in clinic with no improvement.  Allergy Studies:  none   [name], [place] [facility] [place]

## 2017-01-13 ENCOUNTER — Telehealth: Payer: Self-pay

## 2017-01-13 MED ORDER — MOMETASONE FURO-FORMOTEROL FUM 200-5 MCG/ACT IN AERO: 2.0000 | INHALATION_SPRAY | Freq: Two times a day (BID) | RESPIRATORY_TRACT | 5 refills | Status: DC

## 2017-01-13 NOTE — Addendum Note (Signed)
Addended by: Martyn Malay on: 01/13/2017 02:20 PM   Modules accepted: Orders

## 2017-01-13 NOTE — Telephone Encounter (Signed)
Called patient. I informed patient that I sent in [place] 200 and the instructions are 2 puffs twice a day.

## 2017-01-13 NOTE — Telephone Encounter (Signed)
Patient's insurance will not cover the Breo. Please advise.

## 2017-01-13 NOTE — Telephone Encounter (Signed)
I figured that might be the case... Let's do with Dul[place] 200/5 two puffs BID. He should already have a spacer.  Thanks, [name], MD [place] [facility] [place]

## 2017-01-19 ENCOUNTER — Ambulatory Visit (INDEPENDENT_AMBULATORY_CARE_PROVIDER_SITE_OTHER): Payer: PRIVATE HEALTH INSURANCE | Admitting: *Deleted

## 2017-01-19 DIAGNOSIS — J309 Allergic rhinitis, unspecified: Secondary | ICD-10-CM | POA: Diagnosis not present

## 2017-01-26 ENCOUNTER — Ambulatory Visit (INDEPENDENT_AMBULATORY_CARE_PROVIDER_SITE_OTHER): Payer: PRIVATE HEALTH INSURANCE | Admitting: *Deleted

## 2017-01-26 DIAGNOSIS — J309 Allergic rhinitis, unspecified: Secondary | ICD-10-CM | POA: Diagnosis not present

## 2017-02-02 ENCOUNTER — Ambulatory Visit (INDEPENDENT_AMBULATORY_CARE_PROVIDER_SITE_OTHER): Payer: PRIVATE HEALTH INSURANCE | Admitting: *Deleted

## 2017-02-02 DIAGNOSIS — J309 Allergic rhinitis, unspecified: Secondary | ICD-10-CM

## 2017-02-09 ENCOUNTER — Ambulatory Visit (INDEPENDENT_AMBULATORY_CARE_PROVIDER_SITE_OTHER): Payer: PRIVATE HEALTH INSURANCE | Admitting: *Deleted

## 2017-02-09 DIAGNOSIS — J309 Allergic rhinitis, unspecified: Secondary | ICD-10-CM

## 2017-02-16 ENCOUNTER — Ambulatory Visit (INDEPENDENT_AMBULATORY_CARE_PROVIDER_SITE_OTHER): Payer: PRIVATE HEALTH INSURANCE | Admitting: *Deleted

## 2017-02-16 DIAGNOSIS — J309 Allergic rhinitis, unspecified: Secondary | ICD-10-CM | POA: Diagnosis not present

## 2017-02-17 NOTE — Progress Notes (Signed)
VIALS EXP 02-18-18

## 2017-02-19 DIAGNOSIS — J3089 Other allergic rhinitis: Secondary | ICD-10-CM | POA: Diagnosis not present

## 2017-03-16 ENCOUNTER — Ambulatory Visit (INDEPENDENT_AMBULATORY_CARE_PROVIDER_SITE_OTHER): Payer: PRIVATE HEALTH INSURANCE | Admitting: *Deleted

## 2017-03-16 DIAGNOSIS — J309 Allergic rhinitis, unspecified: Secondary | ICD-10-CM | POA: Diagnosis not present

## 2017-03-30 ENCOUNTER — Ambulatory Visit (INDEPENDENT_AMBULATORY_CARE_PROVIDER_SITE_OTHER): Payer: PRIVATE HEALTH INSURANCE | Admitting: *Deleted

## 2017-03-30 DIAGNOSIS — J309 Allergic rhinitis, unspecified: Secondary | ICD-10-CM | POA: Diagnosis not present

## 2017-04-06 ENCOUNTER — Ambulatory Visit (INDEPENDENT_AMBULATORY_CARE_PROVIDER_SITE_OTHER): Payer: PRIVATE HEALTH INSURANCE | Admitting: *Deleted

## 2017-04-06 DIAGNOSIS — J309 Allergic rhinitis, unspecified: Secondary | ICD-10-CM | POA: Diagnosis not present

## 2017-04-13 ENCOUNTER — Ambulatory Visit (INDEPENDENT_AMBULATORY_CARE_PROVIDER_SITE_OTHER): Payer: PRIVATE HEALTH INSURANCE | Admitting: *Deleted

## 2017-04-13 DIAGNOSIS — J309 Allergic rhinitis, unspecified: Secondary | ICD-10-CM

## 2017-04-19 ENCOUNTER — Encounter: Payer: Self-pay | Admitting: Allergy & Immunology

## 2017-04-19 ENCOUNTER — Ambulatory Visit (INDEPENDENT_AMBULATORY_CARE_PROVIDER_SITE_OTHER): Payer: PRIVATE HEALTH INSURANCE | Admitting: Allergy & Immunology

## 2017-04-19 VITALS — BP 124/78 | HR 78 | Resp 16

## 2017-04-19 DIAGNOSIS — J4550 Severe persistent asthma, uncomplicated: Secondary | ICD-10-CM

## 2017-04-19 DIAGNOSIS — J3089 Other allergic rhinitis: Secondary | ICD-10-CM

## 2017-04-19 DIAGNOSIS — K219 Gastro-esophageal reflux disease without esophagitis: Secondary | ICD-10-CM | POA: Diagnosis not present

## 2017-04-19 DIAGNOSIS — J302 Other seasonal allergic rhinitis: Secondary | ICD-10-CM

## 2017-04-19 HISTORY — DX: Other seasonal allergic rhinitis: J30.2

## 2017-04-19 MED ORDER — FLUTICASONE PROPIONATE 93 MCG/ACT NA EXHU: 1.0000 | INHALANT_SUSPENSION | Freq: Two times a day (BID) | NASAL | 3 refills | Status: DC

## 2017-04-19 MED ORDER — AZELASTINE HCL 0.1 % NA SOLN: 2.0000 | Freq: Two times a day (BID) | NASAL | 5 refills | Status: DC

## 2017-04-19 NOTE — Patient Instructions (Addendum)
1. Severe persistent asthma, uncomplicated - We did not do lung testing since you were recently in the clinic.  - Since things are going so well, we will not make any medication changes.  - Daily controller medication(s): Symbicort 160/4.5 two puffs twice daily + Spiriva one capsule daily + Singulair (montelukast) 58m daily - Rescue medications: albuterol 4 puffs every 4-6 hours as needed - Asthma control goals:  * Full participation in all desired activities (may need albuterol before activity) * Albuterol use two time or less a week on average (not counting use with activity) * Cough interfering with sleep two time or less a month * Oral steroids no more than once a year * No hospitalizations  2.Chronic nonseasonal allergic rhinitis - Continue with Xhance: one puff twice daily - Continue with allergy shots at the same schedule.   3. Return in about 6 months (around 10/17/2017).   Please inform u[place]of any Emergency Department visits, hospitalizations, or changes in symptoms. Call u[place]abefore going to the ED for breathing or allergy symptoms since we might be able to fit you in for a sick visit. Feel free to contact u[place]anytime with any questions, problems, or concerns.  It was a pleasure to see you again today! Enjoy the upcoming fall season!  Websites that have reliable patient information: 1. American Academy of Asthma, Allergy, and Immunology: www.aaaai.org 2. Food Allergy Research and Education (FARE): foodallergy.org 3. Mothers of Asthmatics: http://www.asthmacommunitynetwork.org 4. American College of Allergy, Asthma, and Immunology: www.acaai.org   Election Day is coming up on Tuesday, November 6th! Make your voice heard! Register to vote at vote.org!

## 2017-04-19 NOTE — Progress Notes (Signed)
FOLLOW UP  Date of Service/Encounter:  04/19/17   Assessment:   Severe persistent asthma, uncomplicated  Chronic nonseasonal allergic rhinitis (trees, weeds, mold, dust mite)  Current smoker  Previous World Location manager   Asthma Reportables:  Severity: severe persistent  Risk: high Control: not well controlled    Plan/Recommendations:   1. Severe persistent asthma, uncomplicated - We did not do lung testing since you were recently in the clinic.  - Since things are going so well, we will not make any medication changes.  - Daily controller medication(s): Symbicort 160/4.5 two puffs twice daily + Spiriva one capsule daily + Singulair (montelukast) 52m daily - Rescue medications: albuterol 4 puffs every 4-6 hours as needed - Asthma control goals:  * Full participation in all desired activities (may need albuterol before activity) * Albuterol use two time or less a week on average (not counting use with activity) * Cough interfering with sleep two time or less a month * Oral steroids no more than once a year * No hospitalizations  2.Chronic nonseasonal allergic rhinitis - Continue with Xhance: one puff twice daily - Continue with allergy shots at the same schedule.   3. Return in about 6 months (around 10/17/2017).   Subjective:   Caleb Evans a 66y.o. male presenting today for follow up of  Chief Complaint  Patient presents with  . Asthma  . Medication Management    Caleb Birdwellhas a history of the following: Patient Active Problem List   Diagnosis Date Noted  . Seasonal and perennial allergic rhinitis 04/19/2017  . Severe persistent asthma, uncomplicated 054/04/8118 . Esophageal reflux 04/08/2016  . Tobacco use 04/08/2016  . Chronic nonseasonal allergic rhinitis due to fungal spores 01/17/2014  . COPD, mild ([place] 01/17/2014  . Mild chronic obstructive pulmonary disease ([place] 01/17/2014    History obtained from:  chart review and patient.  Caleb Evans - [place] Provider is Caleb Evans, Caleb Evans.     Caleb Evans a 66y.o. male presenting for a follow up visit. He was last seen in June 2018. At that time, I diagnosed him with an asthma exacerbation secondary to sinusitis. We stopped the Spiriva and the Symbicort and started Breo 200/25 and continued him on Singulair 157mdaily. For his sinusitis, I prescribed a steroid pack and continue nasal saline rinses. I also recommended Mucinex. We started him on Xhance one puff twice daily.   Since the last visit, Caleb Evans done well. [place] and despite [place], he was able to breathe fairly well. 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 He was not able to continue on the [facility]therefore he was changed back to Symbicort. His insurance would not approve the BrPickstownHe also remains on Spiriva one capsule daily in conjunction with Singulair.   [name] on allergen immunotherapy. He receives one injection. Immunotherapy script #1 contains trees, weeds and dust mites. He currently receives 0.4022mf the RED vial (1/100). He started shots December of 2017 and reached maintenance in May of 2018. He is on XhaWenonad feels that this is doing well. He is even willing to pay the $100 copayment; this seems like a high copayment, therefore I will have one of our staff reach out to the representative to check on that.   Otherwise, there have been no changes to his past  medical history, surgical history, family history, or social history.    Review of Systems: a 14-point review of systems is pertinent for what is mentioned in HPI.  Otherwise, all other systems were negative. Constitutional: negative other than that listed in the HPI Eyes: negative other than that listed in the HPI Ears, nose, mouth, throat, and face: negative other than that listed in the  HPI Respiratory: negative other than that listed in the HPI Cardiovascular: negative other than that listed in the HPI Gastrointestinal: negative other than that listed in the HPI Genitourinary: negative other than that listed in the HPI Integument: negative other than that listed in the HPI Hematologic: negative other than that listed in the HPI Musculoskeletal: negative other than that listed in the HPI Neurological: negative other than that listed in the HPI Allergy/Immunologic: negative other than that listed in the HPI    Objective:   Blood pressure 124/78, pulse 78, resp. rate 16, SpO2 98 %. There is no height or weight on file to calculate BMI.   Physical Exam:  General: Alert, interactive, in no acute distress. Eyes: No conjunctival injection present on the right, No conjunctival injection present on the left, PERRL bilaterally, No discharge on the right, No discharge on the left and No Horner-Trantas dots present Ears: Right TM pearly gray with normal light reflex, Left TM pearly gray with normal light reflex, Right TM intact without perforation and Left TM intact without perforation.  Nose/Throat: External nose within normal limits and septum midline, turbinates edematous and pale with clear discharge, post-pharynx erythematous with cobblestoning in the posterior oropharynx. Tonsils 2+ without exudates Neck: Supple without thyromegaly. Lungs: Clear to auscultation without wheezing, rhonchi or rales. No increased work of breathing. CV: Normal S1/S2, no murmurs. Capillary refill <2 seconds.  Skin: Warm and dry, without lesions or rashes. Neuro:   Grossly intact. No focal deficits appreciated. Responsive to questions.   Diagnostic studies: none     [name], [facility] [place]

## 2017-04-20 ENCOUNTER — Ambulatory Visit (INDEPENDENT_AMBULATORY_CARE_PROVIDER_SITE_OTHER): Payer: PRIVATE HEALTH INSURANCE | Admitting: *Deleted

## 2017-04-20 DIAGNOSIS — J309 Allergic rhinitis, unspecified: Secondary | ICD-10-CM

## 2017-04-20 MED ORDER — FLUTICASONE PROPIONATE 50 MCG/ACT NA SUSP: 2.0000 | Freq: Every day | NASAL | 12 refills | Status: DC

## 2017-04-20 MED ORDER — AZELASTINE HCL 0.1 % NA SOLN: 2.0000 | Freq: Two times a day (BID) | NASAL | 5 refills | Status: DC

## 2017-04-20 MED ORDER — FLUTICASONE PROPIONATE 93 MCG/ACT NA EXHU: 1.0000 | INHALANT_SUSPENSION | Freq: Two times a day (BID) | NASAL | 3 refills | Status: DC

## 2017-04-20 MED ORDER — BENZONATATE 100 MG PO CAPS
100.0000 mg | ORAL_CAPSULE | Freq: Three times a day (TID) | ORAL | 5 refills | Status: DC | PRN
Start: 2017-04-20 — End: 2017-04-20

## 2017-04-20 MED ORDER — BENZONATATE 100 MG PO CAPS: 100.0000 mg | ORAL_CAPSULE | Freq: Three times a day (TID) | ORAL | 5 refills | Status: DC | PRN

## 2017-04-20 NOTE — Addendum Note (Signed)
Addended by: Valentina Shaggy on: 04/20/2017 03:36 PM   Modules accepted: Orders

## 2017-04-20 NOTE — Addendum Note (Signed)
Addended by: Valentina Shaggy on: 04/20/2017 05:02 PM   Modules accepted: Orders

## 2017-04-27 ENCOUNTER — Ambulatory Visit (INDEPENDENT_AMBULATORY_CARE_PROVIDER_SITE_OTHER): Payer: PRIVATE HEALTH INSURANCE | Admitting: *Deleted

## 2017-04-27 DIAGNOSIS — J309 Allergic rhinitis, unspecified: Secondary | ICD-10-CM | POA: Diagnosis not present

## 2017-05-04 ENCOUNTER — Ambulatory Visit (INDEPENDENT_AMBULATORY_CARE_PROVIDER_SITE_OTHER): Payer: PRIVATE HEALTH INSURANCE | Admitting: *Deleted

## 2017-05-04 DIAGNOSIS — J309 Allergic rhinitis, unspecified: Secondary | ICD-10-CM | POA: Diagnosis not present

## 2017-05-11 ENCOUNTER — Ambulatory Visit (INDEPENDENT_AMBULATORY_CARE_PROVIDER_SITE_OTHER): Payer: PRIVATE HEALTH INSURANCE

## 2017-05-11 DIAGNOSIS — J309 Allergic rhinitis, unspecified: Secondary | ICD-10-CM

## 2017-05-18 ENCOUNTER — Ambulatory Visit: Payer: PRIVATE HEALTH INSURANCE | Admitting: Allergy & Immunology

## 2017-05-18 ENCOUNTER — Ambulatory Visit (INDEPENDENT_AMBULATORY_CARE_PROVIDER_SITE_OTHER): Payer: PRIVATE HEALTH INSURANCE

## 2017-05-18 DIAGNOSIS — J309 Allergic rhinitis, unspecified: Secondary | ICD-10-CM

## 2017-05-25 ENCOUNTER — Ambulatory Visit (INDEPENDENT_AMBULATORY_CARE_PROVIDER_SITE_OTHER): Payer: PRIVATE HEALTH INSURANCE | Admitting: *Deleted

## 2017-05-25 DIAGNOSIS — J309 Allergic rhinitis, unspecified: Secondary | ICD-10-CM | POA: Diagnosis not present

## 2017-06-01 ENCOUNTER — Ambulatory Visit (INDEPENDENT_AMBULATORY_CARE_PROVIDER_SITE_OTHER): Payer: PRIVATE HEALTH INSURANCE

## 2017-06-01 DIAGNOSIS — J309 Allergic rhinitis, unspecified: Secondary | ICD-10-CM | POA: Diagnosis not present

## 2017-06-15 ENCOUNTER — Ambulatory Visit (INDEPENDENT_AMBULATORY_CARE_PROVIDER_SITE_OTHER): Payer: PRIVATE HEALTH INSURANCE | Admitting: *Deleted

## 2017-06-15 DIAGNOSIS — J309 Allergic rhinitis, unspecified: Secondary | ICD-10-CM

## 2017-06-22 ENCOUNTER — Ambulatory Visit (INDEPENDENT_AMBULATORY_CARE_PROVIDER_SITE_OTHER): Payer: PRIVATE HEALTH INSURANCE

## 2017-06-22 DIAGNOSIS — J309 Allergic rhinitis, unspecified: Secondary | ICD-10-CM

## 2017-07-06 ENCOUNTER — Ambulatory Visit (INDEPENDENT_AMBULATORY_CARE_PROVIDER_SITE_OTHER): Payer: PRIVATE HEALTH INSURANCE | Admitting: *Deleted

## 2017-07-06 DIAGNOSIS — J309 Allergic rhinitis, unspecified: Secondary | ICD-10-CM | POA: Diagnosis not present

## 2017-07-09 ENCOUNTER — Encounter: Payer: Self-pay | Admitting: Urology

## 2017-07-09 ENCOUNTER — Ambulatory Visit: Payer: PRIVATE HEALTH INSURANCE | Admitting: Urology

## 2017-07-09 VITALS — BP 127/88 | HR 61 | Ht 63.75 in | Wt 174.7 lb

## 2017-07-09 DIAGNOSIS — C678 Malignant neoplasm of overlapping sites of bladder: Secondary | ICD-10-CM

## 2017-07-09 LAB — URINALYSIS, COMPLETE
Bilirubin, UA: NEGATIVE
Glucose, UA: NEGATIVE
KETONES UA: NEGATIVE
LEUKOCYTES UA: NEGATIVE
NITRITE UA: NEGATIVE
PH UA: 6 (ref 5.0–7.5)
Protein, UA: NEGATIVE
RBC UA: NEGATIVE
UUROB: 0.2 mg/dL (ref 0.2–1.0)

## 2017-07-09 MED ORDER — CIPROFLOXACIN HCL 500 MG PO TABS
500.0000 mg | ORAL_TABLET | Freq: Once | ORAL | Status: AC
  Administered 2017-07-09: 500 mg via ORAL

## 2017-07-09 MED ORDER — LIDOCAINE HCL 2 % EX GEL
1.0000 "application " | Freq: Once | CUTANEOUS | Status: AC
  Administered 2017-07-09: 1 via URETHRAL

## 2017-07-09 NOTE — Progress Notes (Signed)
   07/09/17  CC: No chief complaint on file.   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 He has done well since his last appointment with no complaints or hematuria.     There were no vitals taken for this visit. NED. A&Ox3.   No respiratory distress   Abd soft, NT, ND Normal phallus with bilateral descended testicles  Cystoscopy Procedure Note  Patient identification was confirmed, informed consent was obtained, and patient was prepped using Betadine solution.  Lidocaine jelly was administered per urethral meatus.    Preoperative abx where received prior to procedure.     Pre-Procedure: - Inspection reveals a normal caliber ureteral meatus.  Procedure: The flexible cystoscope was introduced without difficulty - No urethral strictures/lesions are present. - Enlarged prostate  - Normal bladder neck - Bilateral ureteral orifices identified - Bladder mucosa  reveals no ulcers, tumors, or lesions - No bladder stones - No trabeculation  Retroflexion shows no intravesical lobe   Post-Procedure: - Patient tolerated the procedure well  Assessment/ Plan:  1. 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

## 2017-07-13 ENCOUNTER — Ambulatory Visit (INDEPENDENT_AMBULATORY_CARE_PROVIDER_SITE_OTHER): Payer: PRIVATE HEALTH INSURANCE

## 2017-07-13 DIAGNOSIS — J309 Allergic rhinitis, unspecified: Secondary | ICD-10-CM

## 2017-07-20 ENCOUNTER — Ambulatory Visit (INDEPENDENT_AMBULATORY_CARE_PROVIDER_SITE_OTHER): Payer: PRIVATE HEALTH INSURANCE

## 2017-07-20 DIAGNOSIS — J309 Allergic rhinitis, unspecified: Secondary | ICD-10-CM

## 2017-07-27 ENCOUNTER — Ambulatory Visit (INDEPENDENT_AMBULATORY_CARE_PROVIDER_SITE_OTHER): Payer: PRIVATE HEALTH INSURANCE

## 2017-07-27 DIAGNOSIS — J309 Allergic rhinitis, unspecified: Secondary | ICD-10-CM | POA: Diagnosis not present

## 2017-08-11 NOTE — Progress Notes (Signed)
VIAL EXP 08-11-18

## 2017-08-12 DIAGNOSIS — J3089 Other allergic rhinitis: Secondary | ICD-10-CM | POA: Diagnosis not present

## 2017-08-17 ENCOUNTER — Ambulatory Visit (INDEPENDENT_AMBULATORY_CARE_PROVIDER_SITE_OTHER): Payer: PRIVATE HEALTH INSURANCE | Admitting: *Deleted

## 2017-08-17 DIAGNOSIS — J309 Allergic rhinitis, unspecified: Secondary | ICD-10-CM

## 2017-08-24 ENCOUNTER — Ambulatory Visit (INDEPENDENT_AMBULATORY_CARE_PROVIDER_SITE_OTHER): Payer: PRIVATE HEALTH INSURANCE

## 2017-08-24 DIAGNOSIS — J309 Allergic rhinitis, unspecified: Secondary | ICD-10-CM | POA: Diagnosis not present

## 2017-08-31 ENCOUNTER — Ambulatory Visit (INDEPENDENT_AMBULATORY_CARE_PROVIDER_SITE_OTHER): Payer: PRIVATE HEALTH INSURANCE

## 2017-08-31 ENCOUNTER — Other Ambulatory Visit: Payer: Self-pay

## 2017-08-31 DIAGNOSIS — J309 Allergic rhinitis, unspecified: Secondary | ICD-10-CM

## 2017-08-31 MED ORDER — FLUTICASONE PROPIONATE 93 MCG/ACT NA EXHU: 1.0000 | INHALANT_SUSPENSION | Freq: Two times a day (BID) | NASAL | 1 refills | Status: DC

## 2017-09-07 ENCOUNTER — Ambulatory Visit (INDEPENDENT_AMBULATORY_CARE_PROVIDER_SITE_OTHER): Payer: PRIVATE HEALTH INSURANCE | Admitting: *Deleted

## 2017-09-07 DIAGNOSIS — J309 Allergic rhinitis, unspecified: Secondary | ICD-10-CM | POA: Diagnosis not present

## 2017-09-14 ENCOUNTER — Ambulatory Visit (INDEPENDENT_AMBULATORY_CARE_PROVIDER_SITE_OTHER): Payer: PRIVATE HEALTH INSURANCE

## 2017-09-14 DIAGNOSIS — J309 Allergic rhinitis, unspecified: Secondary | ICD-10-CM | POA: Diagnosis not present

## 2017-09-21 ENCOUNTER — Telehealth: Payer: Self-pay | Admitting: *Deleted

## 2017-09-21 ENCOUNTER — Ambulatory Visit (INDEPENDENT_AMBULATORY_CARE_PROVIDER_SITE_OTHER): Payer: PRIVATE HEALTH INSURANCE

## 2017-09-21 DIAGNOSIS — Z87891 Personal history of nicotine dependence: Secondary | ICD-10-CM

## 2017-09-21 DIAGNOSIS — Z122 Encounter for screening for malignant neoplasm of respiratory organs: Secondary | ICD-10-CM

## 2017-09-21 DIAGNOSIS — J309 Allergic rhinitis, unspecified: Secondary | ICD-10-CM

## 2017-09-21 NOTE — Telephone Encounter (Signed)
Received referral for initial lung cancer screening scan. Contacted patient and obtained smoking history,(current, 153 pack year) as well as answering questions related to screening process. Patient denies signs of lung cancer such as weight loss or hemoptysis. Patient denies comorbidity that would prevent curative treatment if lung cancer were found. Patient is scheduled for shared decision making visit and CT scan on 10/14/17.

## 2017-09-27 ENCOUNTER — Other Ambulatory Visit: Payer: Self-pay | Admitting: Family Medicine

## 2017-09-27 DIAGNOSIS — D494 Neoplasm of unspecified behavior of bladder: Secondary | ICD-10-CM

## 2017-10-04 ENCOUNTER — Encounter (HOSPITAL_COMMUNITY): Payer: Self-pay | Admitting: Emergency Medicine

## 2017-10-04 ENCOUNTER — Other Ambulatory Visit: Payer: Self-pay

## 2017-10-04 ENCOUNTER — Emergency Department (HOSPITAL_COMMUNITY): Payer: BLUE CROSS/BLUE SHIELD

## 2017-10-04 ENCOUNTER — Emergency Department (HOSPITAL_COMMUNITY)
Admission: EM | Admit: 2017-10-04 | Discharge: 2017-10-05 | Disposition: A | Discharge status: Home / Self Care | Payer: BLUE CROSS/BLUE SHIELD | Attending: Emergency Medicine | Admitting: Emergency Medicine

## 2017-10-04 DIAGNOSIS — Z79899 Other long term (current) drug therapy: Secondary | ICD-10-CM | POA: Diagnosis not present

## 2017-10-04 DIAGNOSIS — Z859 Personal history of malignant neoplasm, unspecified: Secondary | ICD-10-CM | POA: Diagnosis not present

## 2017-10-04 DIAGNOSIS — R1084 Generalized abdominal pain: Secondary | ICD-10-CM | POA: Diagnosis present

## 2017-10-04 DIAGNOSIS — K5792 Diverticulitis of intestine, part unspecified, without perforation or abscess without bleeding: Secondary | ICD-10-CM | POA: Insufficient documentation

## 2017-10-04 DIAGNOSIS — J449 Chronic obstructive pulmonary disease, unspecified: Secondary | ICD-10-CM | POA: Diagnosis not present

## 2017-10-04 DIAGNOSIS — I10 Essential (primary) hypertension: Secondary | ICD-10-CM | POA: Diagnosis not present

## 2017-10-04 DIAGNOSIS — K5900 Constipation, unspecified: Secondary | ICD-10-CM

## 2017-10-04 DIAGNOSIS — F17210 Nicotine dependence, cigarettes, uncomplicated: Secondary | ICD-10-CM | POA: Diagnosis not present

## 2017-10-04 DIAGNOSIS — J4550 Severe persistent asthma, uncomplicated: Secondary | ICD-10-CM | POA: Insufficient documentation

## 2017-10-04 LAB — URINALYSIS, ROUTINE W REFLEX MICROSCOPIC
Bilirubin Urine: NEGATIVE
Glucose, UA: NEGATIVE mg/dL
Hgb urine dipstick: NEGATIVE
KETONES UR: NEGATIVE mg/dL
Leukocytes, UA: NEGATIVE
Nitrite: NEGATIVE
PROTEIN: NEGATIVE mg/dL
Specific Gravity, Urine: 1.009 (ref 1.005–1.030)
pH: 6 (ref 5.0–8.0)

## 2017-10-04 LAB — CBC
HCT: 46.4 % (ref 39.0–52.0)
Hemoglobin: 15.3 g/dL (ref 13.0–17.0)
MCH: 31 pg (ref 26.0–34.0)
MCHC: 33 g/dL (ref 30.0–36.0)
MCV: 93.9 fL (ref 78.0–100.0)
PLATELETS: 426 10*3/uL — AB (ref 150–400)
RBC: 4.94 MIL/uL (ref 4.22–5.81)
RDW: 14.6 % (ref 11.5–15.5)
WBC: 13.1 10*3/uL — ABNORMAL HIGH (ref 4.0–10.5)

## 2017-10-04 LAB — COMPREHENSIVE METABOLIC PANEL
ALT: 33 U/L (ref 17–63)
AST: 23 U/L (ref 15–41)
Albumin: 4.7 g/dL (ref 3.5–5.0)
Alkaline Phosphatase: 136 U/L — ABNORMAL HIGH (ref 38–126)
Anion gap: 12 (ref 5–15)
BILIRUBIN TOTAL: 0.6 mg/dL (ref 0.3–1.2)
BUN: 17 mg/dL (ref 6–20)
CALCIUM: 10 mg/dL (ref 8.9–10.3)
CO2: 27 mmol/L (ref 22–32)
CREATININE: 0.96 mg/dL (ref 0.61–1.24)
Chloride: 101 mmol/L (ref 101–111)
Glucose, Bld: 99 mg/dL (ref 65–99)
Potassium: 3.6 mmol/L (ref 3.5–5.1)
Sodium: 140 mmol/L (ref 135–145)
TOTAL PROTEIN: 8.5 g/dL — AB (ref 6.5–8.1)

## 2017-10-04 LAB — LIPASE, BLOOD: LIPASE: 39 U/L (ref 11–51)

## 2017-10-04 MED ORDER — FAMOTIDINE IN NACL 20-0.9 MG/50ML-% IV SOLN
20.0000 mg | Freq: Once | INTRAVENOUS | Status: AC
  Administered 2017-10-04: 20 mg via INTRAVENOUS
  Filled 2017-10-04: qty 50

## 2017-10-04 MED ORDER — ONDANSETRON HCL 4 MG/2ML IJ SOLN
4.0000 mg | Freq: Once | INTRAMUSCULAR | Status: AC
  Administered 2017-10-04: 4 mg via INTRAVENOUS
  Filled 2017-10-04: qty 2

## 2017-10-04 MED ORDER — MORPHINE SULFATE (PF) 4 MG/ML IV SOLN
4.0000 mg | Freq: Once | INTRAVENOUS | Status: AC
  Administered 2017-10-04: 4 mg via INTRAVENOUS
  Filled 2017-10-04: qty 1

## 2017-10-04 MED ORDER — IOPAMIDOL (ISOVUE-300) INJECTION 61%
100.0000 mL | Freq: Once | INTRAVENOUS | Status: AC | PRN
  Administered 2017-10-04: 100 mL via INTRAVENOUS

## 2017-10-04 NOTE — ED Triage Notes (Signed)
Pt c/o abdominal cramping with hard stools. Hx of Crohn's. Denies N/V/

## 2017-10-05 ENCOUNTER — Ambulatory Visit (INDEPENDENT_AMBULATORY_CARE_PROVIDER_SITE_OTHER): Payer: PRIVATE HEALTH INSURANCE

## 2017-10-05 DIAGNOSIS — J309 Allergic rhinitis, unspecified: Secondary | ICD-10-CM

## 2017-10-05 MED ORDER — CIPROFLOXACIN HCL 500 MG PO TABS: 500.0000 mg | ORAL_TABLET | Freq: Two times a day (BID) | ORAL | 0 refills | Status: DC

## 2017-10-05 MED ORDER — METRONIDAZOLE 500 MG PO TABS: 500.0000 mg | ORAL_TABLET | Freq: Two times a day (BID) | ORAL | 0 refills | Status: DC

## 2017-10-05 MED ORDER — HYDROCODONE-ACETAMINOPHEN 5-325 MG PO TABS: 1.0000 | ORAL_TABLET | ORAL | 0 refills | Status: DC | PRN

## 2017-10-05 MED ORDER — CIPROFLOXACIN HCL 250 MG PO TABS
500.0000 mg | ORAL_TABLET | Freq: Once | ORAL | Status: AC
  Administered 2017-10-05: 500 mg via ORAL
  Filled 2017-10-05: qty 2

## 2017-10-05 MED ORDER — METRONIDAZOLE 500 MG PO TABS
500.0000 mg | ORAL_TABLET | Freq: Once | ORAL | Status: AC
  Administered 2017-10-05: 500 mg via ORAL
  Filled 2017-10-05: qty 1

## 2017-10-05 MED ORDER — FAMOTIDINE 20 MG PO TABS: 20.0000 mg | ORAL_TABLET | Freq: Two times a day (BID) | ORAL | 0 refills | Status: DC

## 2017-10-05 NOTE — ED Provider Notes (Addendum)
[facility] EMERGENCY DEPARTMENT Provider Note   CSN: [id number] Arrival date & time: 10/04/17  1826     History   Chief Complaint Chief Complaint  Patient presents with  . Abdominal Pain    HPI Caleb Evans is a 67 y.o. male with a history as outlined below, most significant for Crohn's disease, history of diverticulitis, hypertension history of bladder neoplasm with last surveillance cystoscopy completed 3 months ago and negative presenting with periumbilical abdominal pain with sharp radiation into either his right or left upper abdomen which is triggered by meals.  He describes approximately 30 minutes to an hour after eating a meal he will develop this pain which can last for 1-4 hours.  He endorses avoiding food and has had an approximate 15 pound weight loss since his symptoms began over a month ago.  He endorses having harder than normal stools, denies overt constipation with last bowel movement this morning which it did not alleviate his symptoms.  He has had no nausea, vomiting, diarrhea and has been afebrile.  HPI  Past Medical History:  Diagnosis Date  . Adrenal nodule ([place])   . Asthma   . Bladder mass   . Bladder neoplasm   . Cancer ([place])   . Crohn's disease ([place])   . Diverticulitis   . Hypertension   . Over weight   . Skin lesion   . Weak urinary stream     Patient Active Problem List   Diagnosis Date Noted  . Seasonal and perennial allergic rhinitis 04/19/2017  . Severe persistent asthma, uncomplicated 70/96/2836  . Esophageal reflux 04/08/2016  . Tobacco use 04/08/2016  . Chronic nonseasonal allergic rhinitis due to fungal spores 01/17/2014  . COPD, mild ([place]) 01/17/2014  . Mild chronic obstructive pulmonary disease ([place]) 01/17/2014    Past Surgical History:  Procedure Laterality Date  . APPENDECTOMY    . CHOLECYSTECTOMY    . TONSILLECTOMY         Home Medications    Prior to Admission medications   Medication Sig Start Date End Date  Taking? Authorizing Provider  albuterol (PROAIR HFA) 108 (90 Base) MCG/ACT inhaler Inhale 1-2 puffs into the lungs every 6 (six) hours as needed for wheezing or shortness of breath.   Yes [provider]  atorvastatin (LIPITOR) 10 MG tablet Take 10 mg by mouth at bedtime.    Yes [provider]  azelastine (ASTELIN) 0.1 % nasal spray Place 2 sprays into both nostrils 2 (two) times daily. Use in each nostril as directed 04/20/17  Yes Valentina Shaggy, MD  budesonide-formoterol [facility]) 160-4.5 MCG/ACT inhaler Inhale 2 puffs into the lungs 2 (two) times daily. Patient taking differently: Inhale 2 puffs into the lungs daily as needed (for allergies).  04/29/16  Yes Mannam, Praveen, MD  fluticasone (FLONASE) 50 MCG/ACT nasal spray Place 2 sprays into both nostrils daily. 04/20/17  Yes Valentina Shaggy, MD  Fluticasone Propionate Truett Perna) 93 MCG/ACT EXHU Place 1 puff into both nostrils 2 (two) times daily. 08/31/17  Yes Valentina Shaggy, MD  ibuprofen (ADVIL,MOTRIN) 200 MG tablet Take 400 mg by mouth at bedtime as needed for mild pain or moderate pain.   Yes [provider]  Multiple Vitamin (MULTIVITAMIN WITH MINERALS) TABS tablet Take 1 tablet by mouth daily.   Yes [provider]  Olopatadine HCl (PATADAY) 0.2 % SOLN Place 1 drop into both eyes daily as needed. 04/08/16  Yes Padgett, Rae Halsted, MD  SPIRIVA HANDIHALER 18 MCG inhalation capsule  INHALE THE CONTENTS OF ONE CAPSULE UNSING HANDIHALER DEVICE AS DIRECTED. Patient taking differently: INHALE THE CONTENTS OF ONE CAPSULE UNSING HANDIHALER DEVICE AS DIRECTED AS NEEDED 01/01/17  Yes Mannam, Praveen, MD  ciprofloxacin (CIPRO) 500 MG tablet Take 1 tablet (500 mg total) by mouth every 12 (twelve) hours. 10/05/17   Evalee Jefferson, PA-C  famotidine (PEPCID) 20 MG tablet Take 1 tablet (20 mg total) by mouth 2 (two) times daily. 10/05/17   Evalee Jefferson, PA-C  HYDROcodone-acetaminophen (NORCO/VICODIN) 5-325 MG  tablet Take 1 tablet by mouth every 4 (four) hours as needed. 10/05/17   Evalee Jefferson, PA-C  metroNIDAZOLE (FLAGYL) 500 MG tablet Take 1 tablet (500 mg total) by mouth 2 (two) times daily. 10/05/17   Evalee Jefferson, PA-C  Respiratory Therapy Supplies (FLUTTER) DEVI Use as directed. 04/30/16   Marshell Garfinkel, MD    Family History Family History  Adopted: Yes  Problem Relation Age of Onset  . Bladder Cancer Neg Hx   . Kidney cancer Neg Hx   . Prostate cancer Neg Hx   . Allergic rhinitis Neg Hx   . Angioedema Neg Hx   . Asthma Neg Hx   . Eczema Neg Hx   . Urticaria Neg Hx   . Immunodeficiency Neg Hx     Social History Social History   Tobacco Use  . Smoking status: Current Every Day Smoker    Packs/day: 0.50    Years: 50.00    Pack years: 25.00    Types: Cigarettes  . Smokeless tobacco: Never Used  . Tobacco comment: Smokes 3 a day  Substance Use Topics  . Alcohol use: Yes    Alcohol/week: 0.0 oz    Comment: occasional  . Drug use: No     Allergies   Amoxicillin-pot clavulanate   Review of Systems Review of Systems  Constitutional: Negative for chills and fever.  HENT: Negative for congestion and sore throat.   Eyes: Negative.   Respiratory: Negative for chest tightness and shortness of breath.   Cardiovascular: Negative for chest pain.  Gastrointestinal: Positive for abdominal pain and constipation. Negative for blood in stool, diarrhea, nausea, rectal pain and vomiting.  Genitourinary: Negative.  Negative for dysuria.  Musculoskeletal: Negative for arthralgias, joint swelling and neck pain.  Skin: Negative.  Negative for rash and wound.  Neurological: Negative for dizziness, weakness, light-headedness, numbness and headaches.  Psychiatric/Behavioral: Negative.      Physical Exam Updated Vital Signs BP 138/83 (BP Location: Left Arm)   Pulse 64   Temp 97.8 F (36.6 C) (Oral)   Resp 17   Ht 5' 8"  (1.727 m)   Wt 78.5 kg (173 lb)   SpO2 95%   BMI 26.30 kg/m     Physical Exam  Constitutional: He appears well-developed and well-nourished.  HENT:  Head: Normocephalic and atraumatic.  Eyes: Conjunctivae are normal.  Neck: Normal range of motion.  Cardiovascular: Normal rate, regular rhythm, normal heart sounds and intact distal pulses.  Pulmonary/Chest: Effort normal and breath sounds normal. He has no wheezes.  Abdominal: Soft. Bowel sounds are normal. He exhibits no distension, no fluid wave and no mass. There is tenderness in the left upper quadrant and left lower quadrant. There is no rigidity, no rebound and no guarding.  Musculoskeletal: Normal range of motion.  Neurological: He is alert.  Skin: Skin is warm and dry.  Psychiatric: He has a normal mood and affect.  Nursing note and vitals reviewed.    ED Treatments / Results  Labs (  all labs ordered are listed, but only abnormal results are displayed) Labs Reviewed  COMPREHENSIVE METABOLIC PANEL - Abnormal; Notable for the following components:      Result Value   Total Protein 8.5 (*)    Alkaline Phosphatase 136 (*)    All other components within normal limits  CBC - Abnormal; Notable for the following components:   WBC 13.1 (*)    Platelets 426 (*)    All other components within normal limits  LIPASE, BLOOD  URINALYSIS, ROUTINE W REFLEX MICROSCOPIC    EKG  EKG Interpretation None       Radiology Ct Abdomen Pelvis W Contrast  Result Date: 10/04/2017 CLINICAL DATA:  67 year old male with abdominal cramping and hard stools. EXAM: CT ABDOMEN AND PELVIS WITH CONTRAST TECHNIQUE: Multidetector CT imaging of the abdomen and pelvis was performed using the standard protocol following bolus administration of intravenous contrast. CONTRAST:  163m ISOVUE-300 IOPAMIDOL (ISOVUE-300) INJECTION 61% COMPARISON:  None. FINDINGS: Lower chest: The visualized lung bases are clear. No intra-abdominal free air or free fluid. Hepatobiliary: Cholecystectomy. Mild intrahepatic biliary ductal  dilatation, likely post cholecystectomy. The liver is unremarkable. Pancreas: Small coarse calcification in the head of the pancreas, likely sequela of chronic pancreatitis. No acute inflammatory changes. Spleen: Normal in size without focal abnormality. Adrenals/Urinary Tract: The adrenal glands are unremarkable. The kidneys, visualized ureters, and urinary bladder appear unremarkable as well. There is symmetric enhancement and excretion of contrast by both kidneys. Stomach/Bowel: There is extensive colonic diverticulosis primarily involving the descending and sigmoid colon. There is impaction of multiple sigmoid diverticula with dense stool. There is mild segmental thickening of the sigmoid colon which may be combination of muscular hypertrophy and mild acute diverticulitis. Underlying sigmoid mass is not entirely excluded. Clinical correlation and follow-up recommended. No diverticular abscess or perforation. No bowel obstruction. Appendectomy. Vascular/Lymphatic: Moderate aortoiliac atherosclerotic disease. There is a 3 cm infrarenal abdominal aortic aneurysm. Reproductive: The prostate and seminal vesicles are grossly unremarkable. Other: None Musculoskeletal: Mild degenerative changes of the spine. No acute osseous pathology. IMPRESSION: 1. Colonic diverticulosis with multiple stool impacted sigmoid diverticula. Thickened sigmoid colon likely combination of muscular hypertrophy and mild diverticulitis. Clinical correlation is recommended. No diverticular abscess or perforation. No bowel obstruction. 2. A 3 cm infrarenal abdominal aortic aneurysm. Recommend followup by ultrasound in 2 years. This recommendation follows ACR consensus guidelines: White Paper of the ACR Incidental Findings Committee II on Vascular Findings. J Am Coll Radiol 2013; 10:789-794. Electronically Signed   By: AAnner CreteM.D.   On: 10/04/2017 23:47    Procedures Procedures (including critical care time)  Medications Ordered in  ED Medications  famotidine (PEPCID) IVPB 20 mg premix (0 mg Intravenous Stopped 10/05/17 0015)  morphine 4 MG/ML injection 4 mg (4 mg Intravenous Given 10/04/17 2312)  ondansetron (ZOFRAN) injection 4 mg (4 mg Intravenous Given 10/04/17 2311)  iopamidol (ISOVUE-300) 61 % injection 100 mL (100 mLs Intravenous Contrast Given 10/04/17 2323)  ciprofloxacin (CIPRO) tablet 500 mg (500 mg Oral Given 10/05/17 0101)  metroNIDAZOLE (FLAGYL) tablet 500 mg (500 mg Oral Given 10/05/17 0100)     Initial Impression / Assessment and Plan / ED Course  I have reviewed the triage vital signs and the nursing notes.  Pertinent labs & imaging results that were available during my care of the patient were reviewed by me and considered in my medical decision making (see chart for details).    Pt with mid abdominal post prandial pain with no epigastric pain or  acid reflux sx, s/p cholecystitis with CT imaging suggesting early diverticulitis.  He was started on cipro/flagyl, also suggested miralax for constipation.  Discussed close f/u with pcp or return here for any worsened sx, fever, vomiting or new sx. He has no intolerance for PO intake at this time,  Discussed fluid intake, outlined low residue diet. Plan f/u with pcp prn.  Final Clinical Impressions(s) / ED Diagnoses   Final diagnoses:  Diverticulitis  Constipation, unspecified constipation type    ED Discharge Orders        Ordered    HYDROcodone-acetaminophen (NORCO/VICODIN) 5-325 MG tablet  Every 4 hours PRN     10/05/17 0050    ciprofloxacin (CIPRO) 500 MG tablet  Every 12 hours     10/05/17 0050    metroNIDAZOLE (FLAGYL) 500 MG tablet  2 times daily     10/05/17 0050    famotidine (PEPCID) 20 MG tablet  2 times daily     10/05/17 0050       Evalee Jefferson, PA-C 10/05/17 0121    Veryl Speak, MD 10/05/17 0223    Evalee Jefferson, PA-C 10/19/17 1735    Veryl Speak, MD 10/22/17 [phone]

## 2017-10-05 NOTE — Discharge Instructions (Signed)
Take the entire course of the antibiotics prescribed and plan to followup with your doctor for a recheck of your symptoms within 1 week, sooner if you develop worse pain, fevers or vomiting. Your CT scan suggests an early diverticulitis.  Use Miralax as discussed to help avoid constipation. You may take the hydrocodone prescribed for pain relief.  This will make you drowsy - do not drive within 4 hours of taking this medication. Also use this medicine sparingly as it can cause constipation.  It will also mask your pain which is good, but you also need to recognize if your symptoms are worsening so try to minimize taking this medication.

## 2017-10-06 ENCOUNTER — Ambulatory Visit: Admission: RE | Admit: 2017-10-06 | Payer: PRIVATE HEALTH INSURANCE | Source: Ambulatory Visit

## 2017-10-14 ENCOUNTER — Ambulatory Visit
Admission: RE | Admit: 2017-10-14 | Discharge: 2017-10-14 | Disposition: A | Discharge status: Home / Self Care | Payer: BLUE CROSS/BLUE SHIELD | Source: Ambulatory Visit | Attending: Nurse Practitioner | Admitting: Nurse Practitioner

## 2017-10-14 ENCOUNTER — Ambulatory Visit (HOSPITAL_COMMUNITY): Admission: RE | Admit: 2017-10-14 | Payer: BLUE CROSS/BLUE SHIELD | Source: Ambulatory Visit

## 2017-10-14 ENCOUNTER — Inpatient Hospital Stay: Payer: BLUE CROSS/BLUE SHIELD | Attending: Nurse Practitioner | Admitting: Nurse Practitioner

## 2017-10-14 ENCOUNTER — Encounter: Payer: Self-pay | Admitting: Nurse Practitioner

## 2017-10-14 ENCOUNTER — Ambulatory Visit: Payer: BLUE CROSS/BLUE SHIELD

## 2017-10-14 DIAGNOSIS — J438 Other emphysema: Secondary | ICD-10-CM | POA: Insufficient documentation

## 2017-10-14 DIAGNOSIS — I700 Atherosclerosis of aorta: Secondary | ICD-10-CM | POA: Diagnosis not present

## 2017-10-14 DIAGNOSIS — J432 Centrilobular emphysema: Secondary | ICD-10-CM | POA: Insufficient documentation

## 2017-10-14 DIAGNOSIS — I712 Thoracic aortic aneurysm, without rupture: Secondary | ICD-10-CM | POA: Diagnosis not present

## 2017-10-14 DIAGNOSIS — J984 Other disorders of lung: Secondary | ICD-10-CM | POA: Insufficient documentation

## 2017-10-14 DIAGNOSIS — R911 Solitary pulmonary nodule: Secondary | ICD-10-CM | POA: Insufficient documentation

## 2017-10-14 DIAGNOSIS — Z87891 Personal history of nicotine dependence: Secondary | ICD-10-CM

## 2017-10-14 DIAGNOSIS — Z122 Encounter for screening for malignant neoplasm of respiratory organs: Secondary | ICD-10-CM | POA: Diagnosis not present

## 2017-10-14 NOTE — Progress Notes (Signed)
In accordance with CMS guidelines, patient has met eligibility criteria including age, absence of signs or symptoms of lung cancer.  Social History   Tobacco Use  . Smoking status: Current Every Day Smoker    Packs/day: 3.00    Years: 51.00    Pack years: 153.00    Types: Cigarettes  . Smokeless tobacco: Never Used  . Tobacco comment: Smokes 3 a day  Substance Use Topics  . Alcohol use: Yes    Alcohol/week: 0.0 oz    Comment: occasional  . Drug use: No     A shared decision-making session was conducted prior to the performance of CT scan. This includes one or more decision aids, includes benefits and harms of screening, follow-up diagnostic testing, over-diagnosis, false positive rate, and total radiation exposure.  Counseling on the importance of adherence to annual lung cancer LDCT screening, impact of co-morbidities, and ability or willingness to undergo diagnosis and treatment is imperative for compliance of the program.  Counseling on the importance of continued smoking cessation for former smokers; the importance of smoking cessation for current smokers, and information about tobacco cessation interventions have been given to patient including [place] and 1800 quit [place] programs.  Written order for lung cancer screening with LDCT has been given to the patient and any and all questions have been answered to the best of my abilities.   Yearly follow up will be coordinated by Burgess Estelle, Thoracic Navigator.  Faythe Casa, NP 10/14/2017 2:22 PM

## 2017-10-19 ENCOUNTER — Ambulatory Visit (INDEPENDENT_AMBULATORY_CARE_PROVIDER_SITE_OTHER): Payer: PRIVATE HEALTH INSURANCE | Admitting: *Deleted

## 2017-10-19 DIAGNOSIS — J309 Allergic rhinitis, unspecified: Secondary | ICD-10-CM | POA: Diagnosis not present

## 2017-10-20 ENCOUNTER — Telehealth: Payer: Self-pay | Admitting: *Deleted

## 2017-10-20 NOTE — Telephone Encounter (Signed)
Notified patient of LDCT lung cancer screening program results with recommendation for 12 month follow up imaging. Also notified of incidental findings noted below and is encouraged to discuss further with PCP who will receive a copy of this note and/or the CT report. Patient verbalizes understanding.   IMPRESSION: Lung-RADS 2S, benign appearance or behavior. Continue annual screening with low-dose chest CT without contrast in 12 months.  A Lung-RADS "S" modifier is used due to the presence of a potentially clinically significant finding, in this case, 4.2 cm ascending thoracic aortic aneurysm. Generally, annual follow-up CTA or MRA is suggested. In this patient undergoing annual screening, attention on follow-up is satisfactory. This recommendation follows 2010 ACCF/AHA/AATS/ACR/ASA/SCA/SCAI/SIR/STS/SVM Guidelines for the Diagnosis and Management of Patients with Thoracic Aortic Disease. Circulation. 2010; 121: E162-O469.  Aortic aneurysm NOS (ICD10-I71.9), Aortic Atherosclerosis (ICD10-I70.0), and Emphysema (ICD10-J43.9).

## 2017-11-02 ENCOUNTER — Ambulatory Visit (INDEPENDENT_AMBULATORY_CARE_PROVIDER_SITE_OTHER): Payer: PRIVATE HEALTH INSURANCE | Admitting: *Deleted

## 2017-11-02 DIAGNOSIS — J309 Allergic rhinitis, unspecified: Secondary | ICD-10-CM

## 2017-11-03 NOTE — Progress Notes (Signed)
Vial exp 11-04-18

## 2017-11-08 DIAGNOSIS — J3089 Other allergic rhinitis: Secondary | ICD-10-CM | POA: Diagnosis not present

## 2017-11-16 ENCOUNTER — Ambulatory Visit (INDEPENDENT_AMBULATORY_CARE_PROVIDER_SITE_OTHER): Payer: PRIVATE HEALTH INSURANCE | Admitting: *Deleted

## 2017-11-16 DIAGNOSIS — J309 Allergic rhinitis, unspecified: Secondary | ICD-10-CM

## 2017-11-22 ENCOUNTER — Ambulatory Visit (INDEPENDENT_AMBULATORY_CARE_PROVIDER_SITE_OTHER): Payer: PRIVATE HEALTH INSURANCE | Admitting: Vascular Surgery

## 2017-11-22 ENCOUNTER — Encounter (INDEPENDENT_AMBULATORY_CARE_PROVIDER_SITE_OTHER): Payer: Self-pay | Admitting: Vascular Surgery

## 2017-11-22 VITALS — BP 121/76 | HR 57 | Resp 12 | Ht 68.0 in | Wt 171.0 lb

## 2017-11-22 DIAGNOSIS — I712 Thoracic aortic aneurysm, without rupture, unspecified: Secondary | ICD-10-CM | POA: Insufficient documentation

## 2017-11-22 DIAGNOSIS — K219 Gastro-esophageal reflux disease without esophagitis: Secondary | ICD-10-CM

## 2017-11-22 DIAGNOSIS — I714 Abdominal aortic aneurysm, without rupture, unspecified: Secondary | ICD-10-CM | POA: Insufficient documentation

## 2017-11-22 DIAGNOSIS — E782 Mixed hyperlipidemia: Secondary | ICD-10-CM | POA: Diagnosis not present

## 2017-11-22 DIAGNOSIS — E785 Hyperlipidemia, unspecified: Secondary | ICD-10-CM | POA: Insufficient documentation

## 2017-11-22 DIAGNOSIS — J449 Chronic obstructive pulmonary disease, unspecified: Secondary | ICD-10-CM

## 2017-11-22 HISTORY — DX: Thoracic aortic aneurysm, without rupture, unspecified: I71.20

## 2017-11-22 HISTORY — DX: Thoracic aortic aneurysm, without rupture: I71.2

## 2017-11-22 HISTORY — DX: Hyperlipidemia, unspecified: E78.5

## 2017-11-22 NOTE — Progress Notes (Signed)
MRN : [id number]  Caleb Evans is a 67 y.o. ([birth year]) male who presents with chief complaint of No chief complaint on file. Marland Kitchen  History of Present Illness:   The patient presents to the office for evaluation of an thoracic aortic aneurysm. The aneurysm was found incidentally by CT scan. Patient denies abdominal pain or unusual back pain, no other abdominal complaints.  No history of an acute onset of painful blue discoloration of the toes.     No family history of AAA.   Patient denies amaurosis fugax or TIA symptoms. There is no history of claudication or rest pain symptoms of the lower extremities.  The patient denies angina or shortness of breath.  CT scan shows an TAA that measures 4.6 cm  No outpatient medications have been marked as taking for the 11/22/17 encounter (Appointment) with Delana Meyer, Dolores Lory, MD.    Past Medical History:  Diagnosis Date  . Adrenal nodule ([place])   . Asthma   . Bladder mass   . Bladder neoplasm   . Cancer ([place])   . Crohn's disease ([place])   . Diverticulitis   . Hypertension   . Over weight   . Skin lesion   . Weak urinary stream     Past Surgical History:  Procedure Laterality Date  . APPENDECTOMY    . CHOLECYSTECTOMY    . TONSILLECTOMY      Social History Social History   Tobacco Use  . Smoking status: Current Every Day Smoker    Packs/day: 3.00    Years: 51.00    Pack years: 153.00    Types: Cigarettes  . Smokeless tobacco: Never Used  . Tobacco comment: Smokes 3 a day  Substance Use Topics  . Alcohol use: Yes    Alcohol/week: 0.0 oz    Comment: occasional  . Drug use: No    Family History Family History  Adopted: Yes  Problem Relation Age of Onset  . Bladder Cancer Neg Hx   . Kidney cancer Neg Hx   . Prostate cancer Neg Hx   . Allergic rhinitis Neg Hx   . Angioedema Neg Hx   . Asthma Neg Hx   . Eczema Neg Hx   . Urticaria Neg Hx   . Immunodeficiency Neg Hx   No family history of bleeding/clotting  disorders, porphyria or autoimmune disease   Allergies  Allergen Reactions  . Amoxicillin-Pot Clavulanate Other (See Comments)    Caused liver failure      REVIEW OF SYSTEMS (Negative unless checked)  Constitutional: [] Weight loss  [] Fever  [] Chills Cardiac: [] Chest pain   [] Chest pressure   [] Palpitations   [] Shortness of breath when laying flat   [] Shortness of breath with exertion. Vascular:  [] Pain in legs with walking   [] Pain in legs at rest  [] History of DVT   [] Phlebitis   [] Swelling in legs   [] Varicose veins   [] Non-healing ulcers Pulmonary:   [] Uses home oxygen   [] Productive cough   [] Hemoptysis   [] Wheeze  [] COPD   [] Asthma Neurologic:  [] Dizziness   [] Seizures   [] History of stroke   [] History of TIA  [] Aphasia   [] Vissual changes   [] Weakness or numbness in arm   [] Weakness or numbness in leg Musculoskeletal:   [] Joint swelling   [] Joint pain   [] Low back pain Hematologic:  [] Easy bruising  [] Easy bleeding   [] Hypercoagulable state   [] Anemic Gastrointestinal:  [] Diarrhea   [] Vomiting  [] Gastroesophageal reflux/heartburn   [] Difficulty swallowing. Genitourinary:  []   Chronic kidney disease   [] Difficult urination  [] Frequent urination   [] Blood in urine Skin:  [] Rashes   [] Ulcers  Psychological:  [] History of anxiety   []  History of major depression.  Physical Examination  There were no vitals filed for this visit. There is no height or weight on file to calculate BMI. Gen: WD/WN, NAD Head: [place]/AT, No temporalis wasting.  Ear/Nose/Throat: Hearing grossly intact, nares w/o erythema or drainage, poor dentition Eyes: PER, EOMI, sclera nonicteric.  Neck: Supple, no masses.  No bruit or JVD.  Pulmonary:  Good air movement, clear to auscultation bilaterally, no use of accessory muscles.  Cardiac: RRR, normal S1, S2, no Murmurs. Vascular:  Vessel Right Left  Radial Palpable Palpable  Ulnar Palpable Palpable  Brachial Palpable Palpable  Carotid Palpable Palpable  Femoral  Palpable Palpable  Popliteal Palpable Palpable  PT Palpable Palpable  DP Palpable Palpable   Gastrointestinal: soft, non-distended. No guarding/no peritoneal signs.  Musculoskeletal: M/S 5/5 throughout.  No deformity or atrophy.  Neurologic: CN 2-12 intact. Pain and light touch intact in extremities.  Symmetrical.  Speech is fluent. Motor exam as listed above. Psychiatric: Judgment intact, Mood & affect appropriate for pt's clinical situation. Dermatologic: No rashes or ulcers noted.  No changes consistent with cellulitis. Lymph : No Cervical lymphadenopathy, no lichenification or skin changes of chronic lymphedema.  CBC Lab Results  Component Value Date   WBC 13.1 (H) 10/04/2017   HGB 15.3 10/04/2017   HCT 46.4 10/04/2017   MCV 93.9 10/04/2017   PLT 426 (H) 10/04/2017    BMET    Component Value Date/Time   NA 140 10/04/2017 1920   K 3.6 10/04/2017 1920   CL 101 10/04/2017 1920   CO2 27 10/04/2017 1920   GLUCOSE 99 10/04/2017 1920   BUN 17 10/04/2017 1920   CREATININE 0.96 10/04/2017 1920   CALCIUM 10.0 10/04/2017 1920   GFRNONAA >60 10/04/2017 1920   GFRAA >60 10/04/2017 1920   CrCl cannot be calculated (Patient's most recent lab result is older than the maximum 21 days allowed.).  COAG No results found for: INR, PROTIME  Radiology No results found.  Assessment/Plan 1. Thoracic aortic aneurysm without rupture ([place]) No surgery or intervention at this time. The patient has an asymptomatic  Thoracic aortic aneurysm that is less than 5 cm in maximal diameter.  I have discussed the natural history of abdominal aortic aneurysm and the small risk of rupture for aneurysm less than 6.0 cm in size.  However, as these small aneurysms tend to enlarge over time, continued surveillance with CT scan is mandatory.  I have also discussed optimizing medical management with hypertension and lipid control and the importance of abstinence from tobacco.  The patient is also encouraged to  exercise a minimum of 30 minutes 4 times a week.  Should the patient develop new onset abdominal or back pain or signs of peripheral embolization they are instructed to seek medical attention immediately and to alert the physician providing care that they have an aneurysm.  The patient voices their understanding. The patient will return in 12 months with a chest CT   A total of 70 minutes was spent with this patient and greater than 50% was spent in counseling and coordination of care with the patient.  Discussion included the treatment options for vascular disease including indications for surgery and intervention.  Also discussed is the appropriate timing of treatment.  In addition medical therapy was discussed.  2. AAA (abdominal aortic aneurysm)  without rupture ([place]) No surgery or intervention at this time. The patient has an asymptomatic abdominal aortic aneurysm that is less than 4 cm in maximal diameter.  I have discussed the natural history of abdominal aortic aneurysm and the small risk of rupture for aneurysm less than 5 cm in size.  However, as these small aneurysms tend to enlarge over time, continued surveillance with ultrasound or CT scan is mandatory.  I have also discussed optimizing medical management with hypertension and lipid control and the importance of abstinence from tobacco.  The patient is also encouraged to exercise a minimum of 30 minutes 4 times a week.  Should the patient develop new onset abdominal or back pain or signs of peripheral embolization they are instructed to seek medical attention immediately and to alert the physician providing care that they have an aneurysm.  The patient voices their understanding. The patient will return in 12 months with an aortic duplex.  - VAS US AORTA/IVC/ILIACS; Future  3. COPD, mild ([place]) Continue pulmonary medications and aerosols as already ordered, these medications have been reviewed and there are no changes at this  time.    4. Gastroesophageal reflux disease, esophagitis presence not specified Continue antihypertensive medications as already ordered, these medications have been reviewed and there are no changes at this time.  Avoidence of caffeine and alcohol  Moderate elevation of the head of the bed   5. Mixed hyperlipidemia Continue statin as ordered and reviewed, no changes at this time    Hortencia Pilar, MD  11/22/2017 12:48 PM

## 2017-11-28 ENCOUNTER — Encounter (INDEPENDENT_AMBULATORY_CARE_PROVIDER_SITE_OTHER): Payer: Self-pay | Admitting: Vascular Surgery

## 2017-11-29 ENCOUNTER — Telehealth: Payer: Self-pay

## 2017-11-29 ENCOUNTER — Other Ambulatory Visit: Payer: Self-pay | Admitting: General Surgery

## 2017-11-29 MED ORDER — DEXTROSE 5 % IV SOLN: 900.0000 mg | INTRAVENOUS | Status: AC

## 2017-11-29 MED ORDER — GENTAMICIN SULFATE 40 MG/ML IJ SOLN: 5.0000 mg/kg | INTRAMUSCULAR | Status: AC

## 2017-11-29 NOTE — Telephone Encounter (Signed)
[facility] called about the patients xhance.

## 2017-11-30 ENCOUNTER — Ambulatory Visit (INDEPENDENT_AMBULATORY_CARE_PROVIDER_SITE_OTHER): Payer: PRIVATE HEALTH INSURANCE | Admitting: *Deleted

## 2017-11-30 ENCOUNTER — Other Ambulatory Visit: Payer: Self-pay

## 2017-11-30 DIAGNOSIS — J309 Allergic rhinitis, unspecified: Secondary | ICD-10-CM | POA: Diagnosis not present

## 2017-11-30 MED ORDER — FLUTICASONE PROPIONATE 93 MCG/ACT NA EXHU: 1.0000 | INHALANT_SUSPENSION | Freq: Two times a day (BID) | NASAL | 12 refills | Status: DC

## 2017-11-30 NOTE — Telephone Encounter (Signed)
rx was sent in to right pharmacy [facility]

## 2017-12-14 ENCOUNTER — Ambulatory Visit (INDEPENDENT_AMBULATORY_CARE_PROVIDER_SITE_OTHER): Payer: Medicare Other | Admitting: *Deleted

## 2017-12-14 DIAGNOSIS — J309 Allergic rhinitis, unspecified: Secondary | ICD-10-CM | POA: Diagnosis not present

## 2017-12-28 ENCOUNTER — Ambulatory Visit (INDEPENDENT_AMBULATORY_CARE_PROVIDER_SITE_OTHER): Payer: Medicare Other

## 2017-12-28 DIAGNOSIS — J309 Allergic rhinitis, unspecified: Secondary | ICD-10-CM | POA: Diagnosis not present

## 2018-01-07 NOTE — Pre-Procedure Instructions (Signed)
Caleb Evans  01/07/2018      [facility] [place] [address] [place] [address] Phone: [phone] Fax: [phone]  [facility] - [address], [place] [address] Phone: [phone] Fax: [phone]    Your procedure is scheduled on June 11  Report to [place] at 7:00 A.M.  Call this number if you have problems the morning of surgery:  [phone]   Remember:  No food after midnight.  You may drink clear liquids until 6:00 A.M..  Clear liquids allowed are:                    Water, Juice (non-citric and without pulp), Carbonated beverages, Clear Tea, Black Coffee only, Plain Jell-O only, Gatorade and Plain Popsicles only                DRINK 2 BOTTLES OF ENSURE THE NIGHT BEFORE SURGERY. DRINK 1 BOTTLE OF ENSURE THE DAY OF SURGERY BY 6:00 A.M.    Take these medicines the morning of surgery with A SIP OF WATER : albuterol inhaler if needed,nasal spray if needed, Xhance, dulera (bring to hospital), eye drops if needed                7 days prior to surgery STOP taking any Aspirin(unless otherwise instructed by your surgeon), Aleve, Naproxen, Ibuprofen, Motrin, Advil, Goody's, BC's, all herbal medications, fish oil, and all vitamins    Do not wear jewelry.  Do not wear lotions, powders, or perfumes, or deodorant.  Do not shave 48 hours prior to surgery.  Men may shave face and neck.  Do not bring valuables to the hospital.  [facility] is not responsible for any belongings or valuables.  Contacts, dentures or bridgework may not be worn into surgery.  Leave your suitcase in the car.  After surgery it may be brought to your room.  For patients admitted to the hospital, discharge time will be determined by your treatment team.  Patients discharged the day of surgery will not be allowed to drive home.    Special instructions:  [facility]- Preparing  For Surgery  Before surgery, you can play an important role. Because skin is not sterile, your skin needs to be as free of germs as possible. You can reduce the number of germs on your skin by washing with CHG (chlorahexidine gluconate) Soap before surgery.  CHG is an antiseptic cleaner which kills germs and bonds with the skin to continue killing germs even after washing.    Oral Hygiene is also important to reduce your risk of infection.  Remember - BRUSH YOUR TEETH THE MORNING OF SURGERY WITH YOUR REGULAR TOOTHPASTE  Please do not use if you have an allergy to CHG or antibacterial soaps. If your skin becomes reddened/irritated stop using the CHG.  Do not shave (including legs and underarms) for at least 48 hours prior to first CHG shower. It is OK to shave your face.  Please follow these instructions carefully.   1. Shower the NIGHT BEFORE SURGERY and the MORNING OF SURGERY with CHG.   2. If you chose to wash your hair, wash your hair first as usual with your normal shampoo.  3. After you shampoo, rinse your hair and body thoroughly to remove the shampoo.  4. Use CHG as you would any other liquid soap. You can apply CHG directly to the skin and wash  gently with a scrungie or a clean washcloth.   5. Apply the CHG Soap to your body ONLY FROM THE NECK DOWN.  Do not use on open wounds or open sores. Avoid contact with your eyes, ears, mouth and genitals (private parts). Wash Face and genitals (private parts)  with your normal soap.  6. Wash thoroughly, paying special attention to the area where your surgery will be performed.  7. Thoroughly rinse your body with warm water from the neck down.  8. DO NOT shower/wash with your normal soap after using and rinsing off the CHG Soap.  9. Pat yourself dry with a CLEAN TOWEL.  10. Wear CLEAN PAJAMAS to bed the night before surgery, wear comfortable clothes the morning of surgery  11. Place CLEAN SHEETS on your bed the night of your first shower  and DO NOT SLEEP WITH PETS.    Day of Surgery:  Do not apply any deodorants/lotions.  Please wear clean clothes to the hospital/surgery center.   Remember to brush your teeth WITH YOUR REGULAR TOOTHPASTE.    Please read over the following fact sheets that you were given. Coughing and Deep Breathing and Surgical Site Infection Prevention

## 2018-01-10 ENCOUNTER — Encounter (HOSPITAL_COMMUNITY)
Admission: RE | Admit: 2018-01-10 | Discharge: 2018-01-10 | Disposition: A | Discharge status: Home / Self Care | Payer: Medicare Other | Source: Ambulatory Visit | Attending: General Surgery | Admitting: General Surgery

## 2018-01-10 ENCOUNTER — Encounter (HOSPITAL_COMMUNITY): Payer: Self-pay

## 2018-01-10 ENCOUNTER — Other Ambulatory Visit: Payer: Self-pay

## 2018-01-10 DIAGNOSIS — K5792 Diverticulitis of intestine, part unspecified, without perforation or abscess without bleeding: Secondary | ICD-10-CM | POA: Diagnosis not present

## 2018-01-10 DIAGNOSIS — Z79899 Other long term (current) drug therapy: Secondary | ICD-10-CM | POA: Diagnosis not present

## 2018-01-10 DIAGNOSIS — J449 Chronic obstructive pulmonary disease, unspecified: Secondary | ICD-10-CM | POA: Insufficient documentation

## 2018-01-10 DIAGNOSIS — K219 Gastro-esophageal reflux disease without esophagitis: Secondary | ICD-10-CM | POA: Insufficient documentation

## 2018-01-10 DIAGNOSIS — Z8551 Personal history of malignant neoplasm of bladder: Secondary | ICD-10-CM | POA: Diagnosis not present

## 2018-01-10 DIAGNOSIS — Z01812 Encounter for preprocedural laboratory examination: Secondary | ICD-10-CM | POA: Insufficient documentation

## 2018-01-10 DIAGNOSIS — K5090 Crohn's disease, unspecified, without complications: Secondary | ICD-10-CM | POA: Insufficient documentation

## 2018-01-10 HISTORY — DX: Chronic obstructive pulmonary disease, unspecified: J44.9

## 2018-01-10 HISTORY — DX: Thoracic aortic aneurysm, without rupture: I71.2

## 2018-01-10 HISTORY — DX: Abdominal aortic aneurysm, without rupture, unspecified: I71.40

## 2018-01-10 HISTORY — DX: Abdominal aortic aneurysm, without rupture: I71.4

## 2018-01-10 HISTORY — DX: Gastro-esophageal reflux disease without esophagitis: K21.9

## 2018-01-10 HISTORY — DX: Thoracic aortic aneurysm, without rupture, unspecified: I71.20

## 2018-01-10 LAB — CBC WITH DIFFERENTIAL/PLATELET
Abs Immature Granulocytes: 0 10*3/uL (ref 0.0–0.1)
BASOS ABS: 0.1 10*3/uL (ref 0.0–0.1)
BASOS PCT: 1 %
EOS ABS: 0.5 10*3/uL (ref 0.0–0.7)
Eosinophils Relative: 6 %
HCT: 47.9 % (ref 39.0–52.0)
Hemoglobin: 16 g/dL (ref 13.0–17.0)
IMMATURE GRANULOCYTES: 0 %
Lymphocytes Relative: 26 %
Lymphs Abs: 2.3 10*3/uL (ref 0.7–4.0)
MCH: 30.9 pg (ref 26.0–34.0)
MCHC: 33.4 g/dL (ref 30.0–36.0)
MCV: 92.5 fL (ref 78.0–100.0)
Monocytes Absolute: 0.6 10*3/uL (ref 0.1–1.0)
Monocytes Relative: 7 %
NEUTROS ABS: 5.3 10*3/uL (ref 1.7–7.7)
NEUTROS PCT: 60 %
PLATELETS: 380 10*3/uL (ref 150–400)
RBC: 5.18 MIL/uL (ref 4.22–5.81)
RDW: 14.9 % (ref 11.5–15.5)
WBC: 8.8 10*3/uL (ref 4.0–10.5)

## 2018-01-10 LAB — HEMOGLOBIN A1C
HEMOGLOBIN A1C: 5.8 % — AB (ref 4.8–5.6)
Mean Plasma Glucose: 119.76 mg/dL

## 2018-01-10 LAB — BASIC METABOLIC PANEL
ANION GAP: 10 (ref 5–15)
BUN: 10 mg/dL (ref 6–20)
CO2: 24 mmol/L (ref 22–32)
CREATININE: 1.05 mg/dL (ref 0.61–1.24)
Calcium: 9.8 mg/dL (ref 8.9–10.3)
Chloride: 105 mmol/L (ref 101–111)
Glucose, Bld: 99 mg/dL (ref 65–99)
Potassium: 4.3 mmol/L (ref 3.5–5.1)
SODIUM: 139 mmol/L (ref 135–145)

## 2018-01-10 NOTE — Progress Notes (Signed)
PCP - Dr. Elyse Jarvis [facility] Cardiologist - denies cardiologist or cardiac workup Recently saw Dr. Delana Meyer with [facility] for Thoracic and abdominal aortic aneurysms.   Chest x-ray - 03/04/17 care everywhere EKG - N/A  Patient denies shortness of breath, fever, cough and chest pain at PAT appointment   Patient verbalized understanding of instructions that were given to them at the PAT appointment. Patient was also instructed that they will need to review over the PAT instructions again at home before surgery.

## 2018-01-11 ENCOUNTER — Ambulatory Visit (INDEPENDENT_AMBULATORY_CARE_PROVIDER_SITE_OTHER): Payer: Medicare Other | Admitting: *Deleted

## 2018-01-11 DIAGNOSIS — J309 Allergic rhinitis, unspecified: Secondary | ICD-10-CM | POA: Diagnosis not present

## 2018-01-12 ENCOUNTER — Encounter (HOSPITAL_COMMUNITY): Payer: Self-pay

## 2018-01-12 NOTE — Progress Notes (Signed)
Anesthesia Chart Review:   Case:  [id number] Date/Time:  01/18/18 0845   Procedure:  LAPAROSCOPIC PARTIAL COLECTOMY ERAS PATHWAY (N/A )   Anesthesia type:  General   Pre-op diagnosis:  recurrent diverticulitis and stricture   Location:  [facility] OR ROOM 02 / [facility]   Surgeon:  Stark Klein, MD      DISCUSSION: - Pt is a 67 year old male with hx small AAA (3 cm by 10/04/17 CT) and small TAA (4.2 cm by 10/15/17 CT), COPD.    VS: BP 120/78   Pulse (!) 56   Temp 36.6 C   Resp 20   Ht 5' 8"  (1.727 m)   Wt 169 lb 9.6 oz (76.9 kg)   SpO2 97%   BMI 25.79 kg/m    PROVIDERS: PCP is [facility], Elyse Jarvis, MD Vascular surgeon is Rica Koyanagi, MD. Last office visit for TAA, AAA 11/22/17; 1 year f/u recommended.    LABS: Labs reviewed: Acceptable for surgery. (all labs ordered are listed, but only abnormal results are displayed)  Labs Reviewed  HEMOGLOBIN A1C - Abnormal; Notable for the following components:      Result Value   Hgb A1c MFr Bld 5.8 (*)    All other components within normal limits  BASIC METABOLIC PANEL  CBC WITH DIFFERENTIAL/PLATELET     IMAGES:  CT chest 10/15/17:  - Lung-RADS 2S, benign appearance or behavior. Continue annual screening with low-dose chest CT without contrast in 12 months. - A Lung-RADS "S" modifier is used due to the presence of a potentially clinically significant finding, in this case, 4.2 cm ascending thoracic aortic aneurysm. Generally, annual follow-up CTA or MRA is suggested  CT abd, pelvis 10/04/17:  1. Colonic diverticulosis with multiple stool impacted sigmoid diverticula. Thickened sigmoid colon likely combination of muscular hypertrophy and mild diverticulitis. Clinical correlation is recommended. No diverticular abscess or perforation. No bowel obstruction. 2. A 3 cm infrarenal abdominal aortic aneurysm. Recommend followup by ultrasound in 2 years.    Past Medical History:  Diagnosis Date  . AAA (abdominal aortic aneurysm) ([place])   .  Adrenal nodule ([place])   . Asthma   . Bladder mass   . Bladder neoplasm   . Cancer [facility])    bladder cancer  . COPD (chronic obstructive pulmonary disease) ([place])   . Crohn's disease ([place])   . Diverticulitis   . GERD (gastroesophageal reflux disease)    takes Nexium off and on  . Over weight   . Skin lesion   . Thoracic aortic aneurysm ([place])   . Weak urinary stream     Past Surgical History:  Procedure Laterality Date  . APPENDECTOMY    . CHOLECYSTECTOMY    . TONSILLECTOMY      MEDICATIONS: . benzonatate (TESSALON) 100 MG capsule  . calcium-vitamin D (OSCAL WITH D) 500-200 MG-UNIT tablet  . albuterol (PROAIR HFA) 108 (90 Base) MCG/ACT inhaler  . atorvastatin (LIPITOR) 80 MG tablet  . azelastine (ASTELIN) 0.1 % nasal spray  . Calcium Carb-Cholecalciferol (CALCIUM 600+D3 PO)  . ciprofloxacin (CIPRO) 500 MG tablet  . famotidine (PEPCID) 20 MG tablet  . fluticasone (FLONASE) 50 MCG/ACT nasal spray  . Fluticasone Propionate (XHANCE) 4 MCG/ACT EXHU  . HYDROcodone-acetaminophen (NORCO/VICODIN) 5-325 MG tablet  . ibuprofen (ADVIL,MOTRIN) 200 MG tablet  . metroNIDAZOLE (FLAGYL) 500 MG tablet  . mometasone-formoterol (DULERA) 200-5 MCG/ACT AERO  . Multiple Vitamin (MULTIVITAMIN WITH MINERALS) TABS tablet  . NON FORMULARY  . Olopatadine HCl (PATADAY) 0.2 % SOLN  No current facility-administered medications for this encounter.    . clindamycin (CLEOCIN) 900 mg in dextrose 5 % 50 mL IVPB   And  . gentamicin (GARAMYCIN) 390 mg in dextrose 5 % 50 mL IVPB    If no changes, I anticipate pt can proceed with surgery as scheduled.   Willeen Cass, FNP-BC [facility]Anesthesiology Phone: [phone] 01/12/2018 11:58 AM

## 2018-01-13 ENCOUNTER — Encounter: Payer: Self-pay | Admitting: Allergy & Immunology

## 2018-01-13 ENCOUNTER — Ambulatory Visit (INDEPENDENT_AMBULATORY_CARE_PROVIDER_SITE_OTHER): Payer: Medicare Other | Admitting: Allergy & Immunology

## 2018-01-13 VITALS — BP 118/70 | HR 77 | Resp 17

## 2018-01-13 DIAGNOSIS — J302 Other seasonal allergic rhinitis: Secondary | ICD-10-CM

## 2018-01-13 DIAGNOSIS — K219 Gastro-esophageal reflux disease without esophagitis: Secondary | ICD-10-CM | POA: Diagnosis not present

## 2018-01-13 DIAGNOSIS — J4550 Severe persistent asthma, uncomplicated: Secondary | ICD-10-CM | POA: Diagnosis not present

## 2018-01-13 DIAGNOSIS — J3089 Other allergic rhinitis: Secondary | ICD-10-CM

## 2018-01-13 MED ORDER — FLUTICASONE PROPIONATE 93 MCG/ACT NA EXHU: 1.0000 | INHALANT_SUSPENSION | Freq: Two times a day (BID) | NASAL | 12 refills | Status: DC

## 2018-01-13 NOTE — Progress Notes (Signed)
FOLLOW UP  Date of Service/Encounter:  01/13/18   Assessment:   Severe persistent asthma, uncomplicated  Chronic nonseasonal allergic rhinitis (trees, weeds, mold, dust mite)  Current smoker  Previous World Location manager   Asthma Reportables: Severity: severe persistent Risk: high Control: not well controlled     Plan/Recommendations:   1. Severe persistent asthma, uncomplicated - Spirometry looked stable. - We will change you to Spiriva Respimat 2.42mg - Daily controller medication(s): Symbicort 160/4.5 two puffs twice daily + Spiriva 2.577m one puff once daily + Singulair (montelukast) 1050maily - Rescue medications: albuterol 4 puffs every 4-6 hours as needed - Asthma control goals:  * Full participation in all desired activities (may need albuterol before activity) * Albuterol use two time or less a week on average (not counting use with activity) * Cough interfering with sleep two time or less a month * Oral steroids no more than once a year * No hospitalizations  2.Chronic nonseasonal allergic rhinitis - Continue with Xhance: one puff twice daily - Continue with allergy shots at the same schedule.    3. Return in about 6 months (around 07/15/2018).  Subjective:   Caleb Evans a 67 45o. male presenting today for follow up of  Chief Complaint  Patient presents with  . Asthma    Caleb Evans a history of the following: Patient Active Problem List   Diagnosis Date Noted  . Aneurysm of thoracic aorta ([place]CC[place]4/15/2019  . Hyperlipidemia 11/22/2017  . AAA (abdominal aortic aneurysm) without rupture ([place]CC[place]des[place]4/15/2019  . Seasonal and perennial allergic rhinitis 04/19/2017  . Severe persistent asthma, uncomplicated 08/74/94/4967 GERD (gastroesophageal reflux disease) 04/08/2016  . Tobacco use 04/08/2016  . Chronic nonseasonal allergic rhinitis due to fungal spores 01/17/2014  . COPD, mild (HCCVerde [place]6/05/2014  . Mild  chronic obstructive pulmonary disease (HCCNod[place]6/05/2014    History obtained from: chart review and patient.  Caleb Evans.     Caleb Evans a 67 56o. male presenting for a follow up visit.  The patient is a delightful 67 76ar old male presenting for a follow-up visit.  He was last seen in September 2018.  He was doing well from an asthma perspective.  He was on Symbicort 160/4.5 mcg 2 puffs twice daily, Spiriva 1 capsule daily, and Singulair 10 mg daily.  For his chronic allergic rhinitis, we continued him on Xhance 1 puff twice daily as well as allergy shots.  Since the last visit, LawWolfgangs done very well. He remains on the Symbicort two puffs BID with the Singulair. He is not using the Spiriva often since it seems that his copayment has been increasing each time he has picked it up. He is still on the capsule device rather than the Respimat. [name]'s asthma has been well controlled. He has not required rescue medication, experienced nocturnal awakenings due to lower respiratory symptoms, nor have activities of daily living been limited. He has required no Emergency Department or Urgent Care visits for his asthma. He has required zero courses of systemic steroids for asthma exacerbations since the last visit. ACT score today is 15, indicating subpar asthma symptom control.   He did have some choking last week which he thinks was related [place]. He does need a refill of the [place] is unsure how much it is going to cost  with Medicaid, but he was paying $50 per month which he was quite pleased with.   Caleb Evans is on allergen immunotherapy. He receives one injection. Immunotherapy script #1 contains trees, weeds and dust mites. He currently receives 0.37m of the RED vial (1/100). He started shots December of 2017 and reached maintenance in May of 2018.    He developed a bowel obstruction and lost 11 pounds secondary to a loss in the 1980s. He is asymptomatic from a Crohn's perspective. He is going to have surgery in the coming months.   Otherwise, there have been no changes to his past medical history, surgical history, family history, or social history.    Review of Systems: a 14-point review of systems is pertinent for what is mentioned in HPI.  Otherwise, all other systems were negative. Constitutional: negative other than that listed in the HPI Eyes: negative other than that listed in the HPI Ears, nose, mouth, throat, and face: negative other than that listed in the HPI Respiratory: negative other than that listed in the HPI Cardiovascular: negative other than that listed in the HPI Gastrointestinal: negative other than that listed in the HPI Genitourinary: negative other than that listed in the HPI Integument: negative other than that listed in the HPI Hematologic: negative other than that listed in the HPI Musculoskeletal: negative other than that listed in the HPI Neurological: negative other than that listed in the HPI Allergy/Immunologic: negative other than that listed in the HPI    Objective:   Blood pressure 118/70, pulse 77, resp. rate 17, SpO2 94 %. There is no height or weight on file to calculate BMI.   Physical Exam:  General: Alert, interactive, in no acute distress. Wearing a loud Hawaiian t-shirt.  Eyes: No conjunctival injection bilaterally, no discharge on the right, no discharge on the left and no Horner-Trantas dots present. PERRL bilaterally. EOMI without pain. No photophobia.  Ears: Right TM pearly gray with normal light reflex, Left TM pearly gray with normal light reflex, Right TM intact without perforation and Left TM intact without perforation.  Nose/Throat: External nose within normal limits and septum midline. Turbinates edematous and pale with clear discharge. Posterior oropharynx erythematous  without cobblestoning in the posterior oropharynx. Tonsils 2+ without exudates.  Tongue without thrush. Lungs: Clear to auscultation without wheezing, rhonchi or rales. No increased work of breathing. CV: Normal S1/S2. No murmurs. Capillary refill <2 seconds.  Skin: Warm and dry, without lesions or rashes. Neuro:   Grossly intact. No focal deficits appreciated. Responsive to questions.  Diagnostic studies:   Spirometry: results abnormal (FEV1: 2.22/72%, FVC: 2.91/73%, FEV1/FVC: 76%).    Spirometry consistent with possible restrictive disease but overall his values are actually slightly better than they were at the last visit.  Allergy Studies: none     JSalvatore Marvel MD  [facility] [place]

## 2018-01-13 NOTE — Patient Instructions (Addendum)
1. Severe persistent asthma, uncomplicated - Spirometry looked stable. - We will change you to Spiriva Respimat 2.77mg - Daily controller medication(s): Symbicort 160/4.5 two puffs twice daily + Spiriva 2.547m one puff once daily + Singulair (montelukast) 103maily - Rescue medications: albuterol 4 puffs every 4-6 hours as needed - Asthma control goals:  * Full participation in all desired activities (may need albuterol before activity) * Albuterol use two time or less a week on average (not counting use with activity) * Cough interfering with sleep two time or less a month * Oral steroids no more than once a year * No hospitalizations  2.Chronic nonseasonal allergic rhinitis - Continue with Xhance: one puff twice daily - Continue with allergy shots at the same schedule.    3. Return in about 6 months (around 07/15/2018).   Please inform us [place] any Emergency Department visits, hospitalizations, or changes in symptoms. Call us [place] going to the ED for breathing or allergy symptoms since we might be able to fit you in for a sick visit. Feel free to contact us [place] with any questions, problems, or concerns.  It was a pleasure to see you again today!  Websites that have reliable patient information: 1. American Academy of Asthma, Allergy, and Immunology: www.aaaai.org 2. Food Allergy Research and Education (FARE): foodallergy.org 3. Mothers of Asthmatics: http://www.asthmacommunitynetwork.org 4. American College of Allergy, Asthma, and Immunology: wwwMonthlyElectricBill.co.ukMake sure you are registered to vote!

## 2018-01-18 ENCOUNTER — Inpatient Hospital Stay (HOSPITAL_COMMUNITY): Payer: Medicare Other | Admitting: Emergency Medicine

## 2018-01-18 ENCOUNTER — Encounter (HOSPITAL_COMMUNITY): Payer: Self-pay | Admitting: *Deleted

## 2018-01-18 ENCOUNTER — Other Ambulatory Visit: Payer: Self-pay

## 2018-01-18 ENCOUNTER — Inpatient Hospital Stay (HOSPITAL_COMMUNITY): Payer: Medicare Other | Admitting: Certified Registered Nurse Anesthetist

## 2018-01-18 ENCOUNTER — Inpatient Hospital Stay (HOSPITAL_COMMUNITY)
Admission: RE | Admit: 2018-01-18 | Discharge: 2018-01-21 | DRG: 330 | Disposition: A | Discharge status: Home / Self Care | Payer: Medicare Other | Source: Ambulatory Visit | Attending: General Surgery | Admitting: General Surgery

## 2018-01-18 ENCOUNTER — Encounter (HOSPITAL_COMMUNITY)
Admission: RE | Disposition: A | Discharge status: Home / Self Care | Payer: Self-pay | Source: Ambulatory Visit | Attending: General Surgery

## 2018-01-18 ENCOUNTER — Ambulatory Visit: Payer: Medicare Other | Admitting: Allergy & Immunology

## 2018-01-18 DIAGNOSIS — K219 Gastro-esophageal reflux disease without esophagitis: Secondary | ICD-10-CM | POA: Diagnosis present

## 2018-01-18 DIAGNOSIS — Z9089 Acquired absence of other organs: Secondary | ICD-10-CM

## 2018-01-18 DIAGNOSIS — J449 Chronic obstructive pulmonary disease, unspecified: Secondary | ICD-10-CM | POA: Diagnosis present

## 2018-01-18 DIAGNOSIS — Z9049 Acquired absence of other specified parts of digestive tract: Secondary | ICD-10-CM

## 2018-01-18 DIAGNOSIS — Z888 Allergy status to other drugs, medicaments and biological substances status: Secondary | ICD-10-CM | POA: Diagnosis not present

## 2018-01-18 DIAGNOSIS — I714 Abdominal aortic aneurysm, without rupture: Secondary | ICD-10-CM | POA: Diagnosis present

## 2018-01-18 DIAGNOSIS — K5090 Crohn's disease, unspecified, without complications: Secondary | ICD-10-CM | POA: Diagnosis present

## 2018-01-18 DIAGNOSIS — K5732 Diverticulitis of large intestine without perforation or abscess without bleeding: Principal | ICD-10-CM | POA: Diagnosis present

## 2018-01-18 DIAGNOSIS — I712 Thoracic aortic aneurysm, without rupture: Secondary | ICD-10-CM | POA: Diagnosis present

## 2018-01-18 DIAGNOSIS — Z8551 Personal history of malignant neoplasm of bladder: Secondary | ICD-10-CM | POA: Diagnosis not present

## 2018-01-18 DIAGNOSIS — F17210 Nicotine dependence, cigarettes, uncomplicated: Secondary | ICD-10-CM | POA: Diagnosis present

## 2018-01-18 DIAGNOSIS — Z881 Allergy status to other antibiotic agents status: Secondary | ICD-10-CM

## 2018-01-18 HISTORY — PX: LAPAROSCOPIC PARTIAL COLECTOMY: SHX5907

## 2018-01-18 LAB — CBC
HCT: 42.4 % (ref 39.0–52.0)
Hemoglobin: 14.1 g/dL (ref 13.0–17.0)
MCH: 30.7 pg (ref 26.0–34.0)
MCHC: 33.3 g/dL (ref 30.0–36.0)
MCV: 92.2 fL (ref 78.0–100.0)
PLATELETS: 372 10*3/uL (ref 150–400)
RBC: 4.6 MIL/uL (ref 4.22–5.81)
RDW: 14.5 % (ref 11.5–15.5)
WBC: 12 10*3/uL — ABNORMAL HIGH (ref 4.0–10.5)

## 2018-01-18 LAB — CREATININE, SERUM
Creatinine, Ser: 0.83 mg/dL (ref 0.61–1.24)
GFR calc Af Amer: >60 mL/min (ref >60)
GFR calc non Af Amer: >60 mL/min (ref >60)

## 2018-01-18 SURGERY — LAPAROSCOPIC PARTIAL COLECTOMY
Anesthesia: General | Site: Abdomen

## 2018-01-18 MED ORDER — ENSURE PRE-SURGERY PO LIQD: 592.0000 mL | Freq: Once | ORAL | Status: DC

## 2018-01-18 MED ORDER — MIDAZOLAM HCL 2 MG/2ML IJ SOLN
INTRAMUSCULAR | Status: DC | PRN
  Administered 2018-01-18: 2 mg via INTRAVENOUS

## 2018-01-18 MED ORDER — SODIUM CHLORIDE 0.9 % IR SOLN
Status: DC | PRN
  Administered 2018-01-18: 1000 mL

## 2018-01-18 MED ORDER — DEXAMETHASONE SODIUM PHOSPHATE 10 MG/ML IJ SOLN
INTRAMUSCULAR | Status: DC | PRN
  Administered 2018-01-18: 10 mg via INTRAVENOUS

## 2018-01-18 MED ORDER — FLUTICASONE PROPIONATE 50 MCG/ACT NA SUSP
1.0000 | Freq: Every day | NASAL | Status: DC
  Administered 2018-01-21: 1 via NASAL
  Filled 2018-01-18: qty 16

## 2018-01-18 MED ORDER — HYDRALAZINE HCL 20 MG/ML IJ SOLN
10.0000 mg | INTRAMUSCULAR | Status: DC | PRN
  Administered 2018-01-18: 10 mg via INTRAVENOUS

## 2018-01-18 MED ORDER — ALVIMOPAN 12 MG PO CAPS
12.0000 mg | ORAL_CAPSULE | ORAL | Status: AC
  Administered 2018-01-18: 12 mg via ORAL
  Filled 2018-01-18: qty 1

## 2018-01-18 MED ORDER — PROMETHAZINE HCL 25 MG/ML IJ SOLN
6.2500 mg | INTRAMUSCULAR | Status: DC | PRN
  Administered 2018-01-18: 6.25 mg via INTRAVENOUS

## 2018-01-18 MED ORDER — 0.9 % SODIUM CHLORIDE (POUR BTL) OPTIME
TOPICAL | Status: DC | PRN
  Administered 2018-01-18: 2000 mL

## 2018-01-18 MED ORDER — MORPHINE SULFATE (PF) 2 MG/ML IV SOLN
1.0000 mg | INTRAVENOUS | Status: DC | PRN
  Administered 2018-01-18 – 2018-01-19 (×2): 2 mg via INTRAVENOUS
  Filled 2018-01-18 (×2): qty 1

## 2018-01-18 MED ORDER — KETOROLAC TROMETHAMINE 30 MG/ML IJ SOLN: 30.0000 mg | Freq: Once | INTRAMUSCULAR | Status: DC | PRN

## 2018-01-18 MED ORDER — BUPIVACAINE-EPINEPHRINE (PF) 0.25% -1:200000 IJ SOLN
INTRAMUSCULAR | Status: AC
  Filled 2018-01-18: qty 30

## 2018-01-18 MED ORDER — ALBUTEROL SULFATE (2.5 MG/3ML) 0.083% IN NEBU: 2.5000 mg | INHALATION_SOLUTION | Freq: Four times a day (QID) | RESPIRATORY_TRACT | Status: DC | PRN

## 2018-01-18 MED ORDER — ACETAMINOPHEN 500 MG PO TABS
1000.0000 mg | ORAL_TABLET | ORAL | Status: AC
  Administered 2018-01-18: 1000 mg via ORAL
  Filled 2018-01-18: qty 2

## 2018-01-18 MED ORDER — MEPERIDINE HCL 50 MG/ML IJ SOLN: 6.2500 mg | INTRAMUSCULAR | Status: DC | PRN

## 2018-01-18 MED ORDER — ALBUMIN HUMAN 5 % IV SOLN
INTRAVENOUS | Status: DC | PRN
  Administered 2018-01-18: 12:00:00 via INTRAVENOUS

## 2018-01-18 MED ORDER — ALBUTEROL SULFATE HFA 108 (90 BASE) MCG/ACT IN AERS
INHALATION_SPRAY | RESPIRATORY_TRACT | Status: DC | PRN
  Administered 2018-01-18: 6 via RESPIRATORY_TRACT

## 2018-01-18 MED ORDER — KETOROLAC TROMETHAMINE 15 MG/ML IJ SOLN
INTRAMUSCULAR | Status: AC
  Filled 2018-01-18: qty 1

## 2018-01-18 MED ORDER — KCL IN DEXTROSE-NACL 20-5-0.45 MEQ/L-%-% IV SOLN
INTRAVENOUS | Status: DC
  Administered 2018-01-18 – 2018-01-19 (×4): via INTRAVENOUS
  Filled 2018-01-18 (×5): qty 1000

## 2018-01-18 MED ORDER — CEFOTETAN DISODIUM-DEXTROSE 2-2.08 GM-%(50ML) IV SOLR
2.0000 g | Freq: Once | INTRAVENOUS | Status: AC
  Administered 2018-01-18: 2 g via INTRAVENOUS

## 2018-01-18 MED ORDER — SCOPOLAMINE 1 MG/3DAYS TD PT72
1.0000 | MEDICATED_PATCH | TRANSDERMAL | Status: DC
  Administered 2018-01-18: 1.5 mg via TRANSDERMAL
  Filled 2018-01-18: qty 1

## 2018-01-18 MED ORDER — DIPHENHYDRAMINE HCL 12.5 MG/5ML PO ELIX: 12.5000 mg | ORAL_SOLUTION | Freq: Four times a day (QID) | ORAL | Status: DC | PRN

## 2018-01-18 MED ORDER — GABAPENTIN 300 MG PO CAPS
300.0000 mg | ORAL_CAPSULE | ORAL | Status: AC
  Administered 2018-01-18: 300 mg via ORAL
  Filled 2018-01-18: qty 1

## 2018-01-18 MED ORDER — ALUM & MAG HYDROXIDE-SIMETH 200-200-20 MG/5ML PO SUSP
30.0000 mL | Freq: Four times a day (QID) | ORAL | Status: DC | PRN
  Administered 2018-01-19: 30 mL via ORAL
  Filled 2018-01-18: qty 30

## 2018-01-18 MED ORDER — ONDANSETRON HCL 4 MG/2ML IJ SOLN: 4.0000 mg | Freq: Four times a day (QID) | INTRAMUSCULAR | Status: DC | PRN

## 2018-01-18 MED ORDER — DIPHENHYDRAMINE HCL 50 MG/ML IJ SOLN: 12.5000 mg | Freq: Four times a day (QID) | INTRAMUSCULAR | Status: DC | PRN

## 2018-01-18 MED ORDER — BUPIVACAINE LIPOSOME 1.3 % IJ SUSP
20.0000 mL | INTRAMUSCULAR | Status: DC
  Filled 2018-01-18: qty 20

## 2018-01-18 MED ORDER — PROMETHAZINE HCL 25 MG/ML IJ SOLN
INTRAMUSCULAR | Status: AC
  Filled 2018-01-18: qty 1

## 2018-01-18 MED ORDER — ALVIMOPAN 12 MG PO CAPS
12.0000 mg | ORAL_CAPSULE | Freq: Two times a day (BID) | ORAL | Status: DC
  Administered 2018-01-19 (×2): 12 mg via ORAL
  Filled 2018-01-18 (×2): qty 1

## 2018-01-18 MED ORDER — LACTATED RINGERS IV SOLN
INTRAVENOUS | Status: DC
  Administered 2018-01-18 (×3): via INTRAVENOUS

## 2018-01-18 MED ORDER — LIDOCAINE HCL 1 % IJ SOLN
INTRAMUSCULAR | Status: AC
  Filled 2018-01-18: qty 20

## 2018-01-18 MED ORDER — PROPOFOL 10 MG/ML IV BOLUS
INTRAVENOUS | Status: DC | PRN
  Administered 2018-01-18: 150 mg via INTRAVENOUS

## 2018-01-18 MED ORDER — FENTANYL CITRATE (PF) 250 MCG/5ML IJ SOLN
INTRAMUSCULAR | Status: DC | PRN
  Administered 2018-01-18: 100 ug via INTRAVENOUS
  Administered 2018-01-18 (×3): 50 ug via INTRAVENOUS

## 2018-01-18 MED ORDER — CHLORHEXIDINE GLUCONATE CLOTH 2 % EX PADS: 6.0000 | MEDICATED_PAD | Freq: Once | CUTANEOUS | Status: DC

## 2018-01-18 MED ORDER — CELECOXIB 100 MG PO CAPS
100.0000 mg | ORAL_CAPSULE | Freq: Two times a day (BID) | ORAL | Status: DC
  Administered 2018-01-18 – 2018-01-21 (×6): 100 mg via ORAL
  Filled 2018-01-18 (×6): qty 1

## 2018-01-18 MED ORDER — ATORVASTATIN CALCIUM 80 MG PO TABS
80.0000 mg | ORAL_TABLET | Freq: Every day | ORAL | Status: DC
  Administered 2018-01-18 – 2018-01-20 (×3): 80 mg via ORAL
  Filled 2018-01-18 (×3): qty 1

## 2018-01-18 MED ORDER — HYDROMORPHONE HCL 2 MG/ML IJ SOLN
0.2500 mg | INTRAMUSCULAR | Status: DC | PRN
  Administered 2018-01-18: 1 mg via INTRAVENOUS

## 2018-01-18 MED ORDER — CEFOTETAN DISODIUM-DEXTROSE 2-2.08 GM-%(50ML) IV SOLR
2.0000 g | Freq: Two times a day (BID) | INTRAVENOUS | Status: AC
  Administered 2018-01-18: 2 g via INTRAVENOUS
  Filled 2018-01-18: qty 50

## 2018-01-18 MED ORDER — OXYCODONE HCL 5 MG PO TABS
5.0000 mg | ORAL_TABLET | ORAL | Status: DC | PRN
  Administered 2018-01-18 – 2018-01-19 (×2): 10 mg via ORAL
  Administered 2018-01-19: 5 mg via ORAL
  Administered 2018-01-19: 10 mg via ORAL
  Administered 2018-01-19 – 2018-01-20 (×2): 5 mg via ORAL
  Administered 2018-01-21: 10 mg via ORAL
  Filled 2018-01-18 (×2): qty 2
  Filled 2018-01-18: qty 1
  Filled 2018-01-18 (×2): qty 2
  Filled 2018-01-18 (×2): qty 1

## 2018-01-18 MED ORDER — SACCHAROMYCES BOULARDII 250 MG PO CAPS
250.0000 mg | ORAL_CAPSULE | Freq: Two times a day (BID) | ORAL | Status: DC
  Administered 2018-01-18 – 2018-01-21 (×6): 250 mg via ORAL
  Filled 2018-01-18 (×6): qty 1

## 2018-01-18 MED ORDER — ENSURE PRE-SURGERY PO LIQD: 296.0000 mL | Freq: Once | ORAL | Status: DC

## 2018-01-18 MED ORDER — MIDAZOLAM HCL 2 MG/2ML IJ SOLN
INTRAMUSCULAR | Status: AC
  Filled 2018-01-18: qty 2

## 2018-01-18 MED ORDER — ONDANSETRON HCL 4 MG/2ML IJ SOLN
INTRAMUSCULAR | Status: AC
  Filled 2018-01-18: qty 2

## 2018-01-18 MED ORDER — PROPOFOL 10 MG/ML IV BOLUS
INTRAVENOUS | Status: AC
  Filled 2018-01-18: qty 20

## 2018-01-18 MED ORDER — FENTANYL CITRATE (PF) 250 MCG/5ML IJ SOLN
INTRAMUSCULAR | Status: AC
  Filled 2018-01-18: qty 5

## 2018-01-18 MED ORDER — GABAPENTIN 300 MG PO CAPS
300.0000 mg | ORAL_CAPSULE | Freq: Two times a day (BID) | ORAL | Status: DC
  Administered 2018-01-18 – 2018-01-21 (×6): 300 mg via ORAL
  Filled 2018-01-18 (×6): qty 1

## 2018-01-18 MED ORDER — LIDOCAINE 2% (20 MG/ML) 5 ML SYRINGE
INTRAMUSCULAR | Status: DC | PRN
  Administered 2018-01-18: 100 mg via INTRAVENOUS

## 2018-01-18 MED ORDER — BOOST / RESOURCE BREEZE PO LIQD CUSTOM
1.0000 | Freq: Three times a day (TID) | ORAL | Status: DC
  Administered 2018-01-18 – 2018-01-19 (×2): 1 via ORAL

## 2018-01-18 MED ORDER — SUGAMMADEX SODIUM 200 MG/2ML IV SOLN
INTRAVENOUS | Status: DC | PRN
  Administered 2018-01-18: 200 mg via INTRAVENOUS

## 2018-01-18 MED ORDER — SODIUM CHLORIDE 0.9 % IV SOLN
INTRAVENOUS | Status: AC
  Filled 2018-01-18: qty 2

## 2018-01-18 MED ORDER — ROCURONIUM BROMIDE 10 MG/ML (PF) SYRINGE
PREFILLED_SYRINGE | INTRAVENOUS | Status: DC | PRN
  Administered 2018-01-18: 10 mg via INTRAVENOUS
  Administered 2018-01-18: 40 mg via INTRAVENOUS
  Administered 2018-01-18: 10 mg via INTRAVENOUS
  Administered 2018-01-18: 40 mg via INTRAVENOUS
  Administered 2018-01-18: 20 mg via INTRAVENOUS

## 2018-01-18 MED ORDER — BENZONATATE 100 MG PO CAPS: 100.0000 mg | ORAL_CAPSULE | Freq: Two times a day (BID) | ORAL | Status: DC | PRN

## 2018-01-18 MED ORDER — BUPIVACAINE-EPINEPHRINE (PF) 0.25% -1:200000 IJ SOLN
INTRAMUSCULAR | Status: DC | PRN
  Administered 2018-01-18: 15 mL

## 2018-01-18 MED ORDER — ONDANSETRON HCL 4 MG/2ML IJ SOLN
INTRAMUSCULAR | Status: DC | PRN
  Administered 2018-01-18: 4 mg via INTRAVENOUS

## 2018-01-18 MED ORDER — OLOPATADINE HCL 0.1 % OP SOLN: 1.0000 [drp] | Freq: Two times a day (BID) | OPHTHALMIC | Status: DC | PRN

## 2018-01-18 MED ORDER — HYDROMORPHONE HCL 2 MG/ML IJ SOLN
INTRAMUSCULAR | Status: AC
  Filled 2018-01-18: qty 1

## 2018-01-18 MED ORDER — ENOXAPARIN SODIUM 40 MG/0.4ML ~~LOC~~ SOLN
40.0000 mg | SUBCUTANEOUS | Status: DC
  Administered 2018-01-19 – 2018-01-20 (×2): 40 mg via SUBCUTANEOUS
  Filled 2018-01-18 (×3): qty 0.4

## 2018-01-18 MED ORDER — ONDANSETRON HCL 4 MG PO TABS: 4.0000 mg | ORAL_TABLET | Freq: Four times a day (QID) | ORAL | Status: DC | PRN

## 2018-01-18 MED ORDER — ACETAMINOPHEN 325 MG PO TABS: 650.0000 mg | ORAL_TABLET | Freq: Four times a day (QID) | ORAL | Status: DC | PRN

## 2018-01-18 MED ORDER — HYDRALAZINE HCL 20 MG/ML IJ SOLN
INTRAMUSCULAR | Status: AC
  Filled 2018-01-18: qty 1

## 2018-01-18 SURGICAL SUPPLY — 84 items
APPLIER CLIP 5 13 M/L LIGAMAX5 (MISCELLANEOUS)
APPLIER CLIP ROT 10 11.4 M/L (STAPLE)
BLADE CLIPPER SURG (BLADE) IMPLANT
CANISTER SUCT 3000ML PPV (MISCELLANEOUS) ×3 IMPLANT
CELLS DAT CNTRL 66122 CELL SVR (MISCELLANEOUS) IMPLANT
CHLORAPREP W/TINT 26ML (MISCELLANEOUS) ×3 IMPLANT
CLIP APPLIE 5 13 M/L LIGAMAX5 (MISCELLANEOUS) IMPLANT
CLIP APPLIE ROT 10 11.4 M/L (STAPLE) IMPLANT
COVER MAYO STAND STRL (DRAPES) ×3 IMPLANT
COVER SURGICAL LIGHT HANDLE (MISCELLANEOUS) ×6 IMPLANT
DERMABOND ADVANCED (GAUZE/BANDAGES/DRESSINGS) ×2
DERMABOND ADVANCED .7 DNX12 (GAUZE/BANDAGES/DRESSINGS) ×1 IMPLANT
DRAPE HALF SHEET 40X57 (DRAPES) ×3 IMPLANT
DRAPE UTILITY XL STRL (DRAPES) ×3 IMPLANT
DRAPE WARM FLUID 44X44 (DRAPE) ×3 IMPLANT
DRSG OPSITE POSTOP 4X10 (GAUZE/BANDAGES/DRESSINGS) IMPLANT
DRSG OPSITE POSTOP 4X6 (GAUZE/BANDAGES/DRESSINGS) ×3 IMPLANT
DRSG OPSITE POSTOP 4X8 (GAUZE/BANDAGES/DRESSINGS) IMPLANT
ELECT BLADE 6.5 EXT (BLADE) ×3 IMPLANT
ELECT CAUTERY BLADE 6.4 (BLADE) ×6 IMPLANT
ELECT REM PT RETURN 9FT ADLT (ELECTROSURGICAL) ×3
ELECTRODE REM PT RTRN 9FT ADLT (ELECTROSURGICAL) ×1 IMPLANT
GEL ULTRASOUND 20GR AQUASONIC (MISCELLANEOUS) IMPLANT
GLOVE BIO SURGEON STRL SZ 6 (GLOVE) ×6 IMPLANT
GLOVE BIO SURGEON STRL SZ8 (GLOVE) ×6 IMPLANT
GLOVE BIOGEL PI IND STRL 7.5 (GLOVE) ×1 IMPLANT
GLOVE BIOGEL PI IND STRL 8 (GLOVE) ×2 IMPLANT
GLOVE BIOGEL PI INDICATOR 7.5 (GLOVE) ×2
GLOVE BIOGEL PI INDICATOR 8 (GLOVE) ×4
GLOVE ECLIPSE 7.5 STRL STRAW (GLOVE) ×3 IMPLANT
GLOVE INDICATOR 6.5 STRL GRN (GLOVE) ×6 IMPLANT
GOWN STRL REUS W/ TWL LRG LVL3 (GOWN DISPOSABLE) ×5 IMPLANT
GOWN STRL REUS W/ TWL XL LVL3 (GOWN DISPOSABLE) ×2 IMPLANT
GOWN STRL REUS W/TWL 2XL LVL3 (GOWN DISPOSABLE) ×6 IMPLANT
GOWN STRL REUS W/TWL LRG LVL3 (GOWN DISPOSABLE) ×10
GOWN STRL REUS W/TWL XL LVL3 (GOWN DISPOSABLE) ×4
KIT BASIN OR (CUSTOM PROCEDURE TRAY) ×3 IMPLANT
KIT TURNOVER KIT B (KITS) ×3 IMPLANT
L-HOOK LAP DISP 36CM (ELECTROSURGICAL) ×3
LEGGING LITHOTOMY PAIR STRL (DRAPES) IMPLANT
LHOOK LAP DISP 36CM (ELECTROSURGICAL) ×1 IMPLANT
LIGASURE IMPACT 36 18CM CVD LR (INSTRUMENTS) IMPLANT
NS IRRIG 1000ML POUR BTL (IV SOLUTION) ×6 IMPLANT
PAD ARMBOARD 7.5X6 YLW CONV (MISCELLANEOUS) ×6 IMPLANT
PENCIL BUTTON HOLSTER BLD 10FT (ELECTRODE) ×6 IMPLANT
RTRCTR WOUND ALEXIS 18CM MED (MISCELLANEOUS)
SCISSORS LAP 5X35 DISP (ENDOMECHANICALS) ×3 IMPLANT
SEALER TISSUE G2 STRG ARTC 35C (ENDOMECHANICALS) ×3 IMPLANT
SET IRRIG TUBING LAPAROSCOPIC (IRRIGATION / IRRIGATOR) ×3 IMPLANT
SLEEVE ENDOPATH XCEL 5M (ENDOMECHANICALS) ×6 IMPLANT
SPECIMEN JAR LARGE (MISCELLANEOUS) ×3 IMPLANT
SPONGE LAP 18X18 X RAY DECT (DISPOSABLE) IMPLANT
STAPLER CUT CVD 40MM BLUE (STAPLE) ×3 IMPLANT
STAPLER VISISTAT 35W (STAPLE) ×3 IMPLANT
SUCTION POOLE TIP (SUCTIONS) ×3 IMPLANT
SURGILUBE 2OZ TUBE FLIPTOP (MISCELLANEOUS) IMPLANT
SUT MNCRL AB 4-0 PS2 18 (SUTURE) ×6 IMPLANT
SUT PDS AB 1 CT  36 (SUTURE)
SUT PDS AB 1 CT 36 (SUTURE) IMPLANT
SUT PROLENE 2 0 CT2 30 (SUTURE) IMPLANT
SUT PROLENE 2 0 KS (SUTURE) IMPLANT
SUT VIC AB 2-0 SH 18 (SUTURE) ×3 IMPLANT
SUT VIC AB 3-0 SH 18 (SUTURE) ×3 IMPLANT
SUT VIC AB 3-0 SH 27 (SUTURE) ×2
SUT VIC AB 3-0 SH 27X BRD (SUTURE) ×1 IMPLANT
SUT VICRYL AB 2 0 TIES (SUTURE) ×3 IMPLANT
SUT VICRYL AB 3 0 TIES (SUTURE) ×3 IMPLANT
SYR BULB IRRIGATION 50ML (SYRINGE) ×3 IMPLANT
SYS LAPSCP GELPORT 120MM (MISCELLANEOUS)
SYSTEM LAPSCP GELPORT 120MM (MISCELLANEOUS) IMPLANT
TOWEL OR 17X26 10 PK STRL BLUE (TOWEL DISPOSABLE) ×6 IMPLANT
TRAY FOLEY MTR SLVR 16FR STAT (SET/KITS/TRAYS/PACK) ×3 IMPLANT
TRAY LAPAROSCOPIC MC (CUSTOM PROCEDURE TRAY) ×3 IMPLANT
TRAY PROCTOSCOPIC FIBER OPTIC (SET/KITS/TRAYS/PACK) IMPLANT
TROCAR XCEL 12X100 BLDLESS (ENDOMECHANICALS) IMPLANT
TROCAR XCEL BLUNT TIP 100MML (ENDOMECHANICALS) IMPLANT
TROCAR XCEL NON-BLD 11X100MML (ENDOMECHANICALS) IMPLANT
TROCAR XCEL NON-BLD 5MMX100MML (ENDOMECHANICALS) ×3 IMPLANT
TUBE CONNECTING 12'X1/4 (SUCTIONS) ×3
TUBE CONNECTING 12X1/4 (SUCTIONS) ×6 IMPLANT
TUBING INSUF HEATED (TUBING) ×3 IMPLANT
TUBING INSUFFLATION (TUBING) ×3 IMPLANT
WATER STERILE IRR 1000ML POUR (IV SOLUTION) ×3 IMPLANT
YANKAUER SUCT BULB TIP NO VENT (SUCTIONS) ×6 IMPLANT

## 2018-01-18 NOTE — Anesthesia Procedure Notes (Signed)
Procedure Name: Intubation Date/Time: 01/18/2018 9:45 AM Performed by: Bryson Corona, CRNA Pre-anesthesia Checklist: Patient identified, Emergency Drugs available, Suction available and Patient being monitored Patient Re-evaluated:Patient Re-evaluated prior to induction Oxygen Delivery Method: Circle System Utilized Preoxygenation: Pre-oxygenation with 100% oxygen Induction Type: IV induction Ventilation: Oral airway inserted - appropriate to patient size Laryngoscope Size: Mac and 4 Grade View: Grade I Tube type: Oral Tube size: 7.0 mm Number of attempts: 1 Airway Equipment and Method: Stylet and Oral airway Placement Confirmation: ETT inserted through vocal cords under direct vision,  positive ETCO2 and breath sounds checked- equal and bilateral Secured at: 22 cm Tube secured with: Tape Dental Injury: Teeth and Oropharynx as per pre-operative assessment

## 2018-01-18 NOTE — Discharge Instructions (Signed)
[facility] °Office Phone Number [phone] ° ° POST OP INSTRUCTIONS ° °Always review your discharge instruction sheet given to you by the facility where your surgery was performed. ° °IF YOU HAVE DISABILITY OR FAMILY LEAVE FORMS, YOU MUST BRING THEM TO THE OFFICE FOR PROCESSING.  DO NOT GIVE THEM TO YOUR DOCTOR. ° °1. A prescription for pain medication may be given to you upon discharge.  Take your pain medication as prescribed, if needed.  If narcotic pain medicine is not needed, then you may take acetaminophen (Tylenol) or ibuprofen (Advil) as needed. °2. Take your usually prescribed medications unless otherwise directed °3. If you need a refill on your pain medication, please contact your pharmacy.  They will contact our office to request authorization.  Prescriptions will not be filled after 5pm or on week-ends. °4. You should eat very light the first 24 hours after surgery, such as soup, crackers, pudding, etc.  Resume your normal diet the day after surgery °5. It is common to experience some constipation if taking pain medication after surgery.  Increasing fluid intake and taking a stool softener will usually help or prevent this problem from occurring.  A mild laxative (Milk of Magnesia or Miralax) should be taken according to package directions if there are no bowel movements after 48 hours. °6. You may shower in 48 hours.  The surgical glue will flake off in 2-3 weeks.   °7. ACTIVITIES:  No strenuous activity or heavy lifting for 1 week.   °a. You may drive when you no longer are taking prescription pain medication, you can comfortably wear a seatbelt, and you can safely maneuver your car and apply brakes. °b. RETURN TO WORK:  __________to be determined._______________ °You should see your doctor in the office for a follow-up appointment approximately three-four weeks after your surgery.   ° °WHEN TO CALL YOUR DOCTOR: °1. Fever over 101.0 °2. Nausea and/or vomiting. °3. Extreme swelling  or bruising. °4. Continued bleeding from incision. °5. Increased pain, redness, or drainage from the incision. ° °The clinic staff is available to answer your questions during regular business hours.  Please don’t hesitate to call and ask to speak to one of the nurses for clinical concerns.  If you have a medical emergency, go to the nearest emergency room or call 911.  A surgeon from [facility] is always on call at the hospital. ° °For further questions, please visit [email or web address]  ° °

## 2018-01-18 NOTE — Plan of Care (Signed)
  Problem: Pain Managment: Goal: General experience of comfort will improve Outcome: Progressing   Problem: Clinical Measurements: Goal: Postoperative complications will be avoided or minimized Outcome: Progressing   Problem: Skin Integrity: Goal: Demonstration of wound healing without infection will improve Outcome: Progressing

## 2018-01-18 NOTE — Transfer of Care (Signed)
Immediate Anesthesia Transfer of Care Note  Patient: Caleb Evans  Procedure(s) Performed: LAPAROSCOPIC PARTIAL COLECTOMY ERAS PATHWAY (N/A Abdomen)  Patient Location: PACU  Anesthesia Type:General  Level of Consciousness: drowsy and patient cooperative  Airway & Oxygen Therapy: Patient Spontanous Breathing and Patient connected to nasal cannula oxygen  Post-op Assessment: Report given to RN and Post -op Vital signs reviewed and stable  Post vital signs: Reviewed and stable  Last Vitals:  Vitals Value Taken Time  BP 150/103 01/18/2018  1:13 PM  Temp    Pulse 81 01/18/2018  1:14 PM  Resp 14 01/18/2018  1:14 PM  SpO2 100 % 01/18/2018  1:14 PM  Vitals shown include unvalidated device data.  Last Pain:  Vitals:   01/18/18 0729  TempSrc:   PainSc: 0-No pain         Complications: No apparent anesthesia complications

## 2018-01-18 NOTE — Progress Notes (Signed)
Cefotan 2 gm pre-op per Dr. Barry Dienes. Dr. Barry Dienes aware of amoxicillin allergy,okay to give.

## 2018-01-18 NOTE — Op Note (Signed)
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  Colonoscopy and biopsies negative for cancer.    Pre-operative Diagnosis: Recurrent sigmoid diverticulitis with stricture  Post-operative Diagnosis: Same  Surgeon: [id number]   Assistants: Judyann Munson, RNFA.    Anesthesia: General endotracheal anesthesia and Local anesthesia 1% plain lidocaine, 0.5% bupivacaine, with epinephrine  ASA Class: 2  Procedure Details  The patient was seen in the Holding Room. The risks, benefits, complications, treatment options, and expected outcomes were discussed with the patient. The possibilities of reaction to medication, perforation of viscus, bleeding, recurrent infection, finding a normal colon, the need for additional procedures, failure to diagnose a condition, and creating a complication requiring transfusion or operation were discussed with the patient. The patient was advised of the risk of ostomy.  The patient concurred with the proposed plan, giving informed consent.   The patient was taken to the operating room, identified, and the procedure verified as laparoscopic sigmoid colectomy. A Time Out was held and the above information confirmed.  The patient was brought to the operating room and placed into low lithotomy position. After induction of a general anesthetic, a Foley catheter was inserted and the abdomen was prepped and draped in standard fashion. The patient was then placed into reverse trendelenburg position and rotated to the right. A 5 mm Optiview trocar was placed at the left costal margin under direct visualization. Pneumoperitoneum was insufflated to a pressure of 15 mm Hg. The laparoscope was introduced.    Two right sided 5-mm trocars were then placed after anesthetizing the skin and peritoneum with Marcaine. Exploration revealed a normal omentum, small bowel,  peritoneum, liver, and stomach.There were no real abdominal wall adhesions.  The sigmoid colon was quite inflamed and densely adherent to the anterolateral abdominal wall.  Sharp dissection was used to take the sigmoid off the abdominal wall.  A fourth 5 mm trocar was placed in the left mid abdomen.     The descending colon was then mobilized with gentle retraction of the colon in a medial direction with mobilization of the peritoneal reflection with cautery and the Harmonic Scalpel. Mobilization of this area was complete to expose the retroperitoneum. The left colon was able to mobilize downward.  The sigmoid colon was very adherent to the pelvis.  The mesocolon was divided with the enseal, taking care to stay high on the colon to avoid the ureter.   After completing mobilization, the lower midline was opened with a #10 blade for the colon extraction port.  The colon was divided at the descending colon/sigmoid junction with the bovie.    Once the sigmoid was fully mobilized, the proximal rectum/distal sigmoid junction was divided with the contour stapler.  The sites of division were soft and uninflamed.  The specimen was submitted to pathology.      An end-to-end anastomosis was performed with the covidien EEA stapling device. The proximal end was opened and the sizers used to determine which EEA to use.  The 28 mm stapler was selected.  A 0-0 prolene pursestring suture was placed around the anvil.  The length of the colon was good with no tension.  The stapler was placed via the anus, the spike deployed, and the stapler coupled without difficulty.   The colon ends were pulled together with the stapler.  Pressure was held for 1 minute.  The stapler was fired and pressure held for another 30 seconds.  The stapler  was removed.  The anastamotic rings were robust and complete.  The proctoscope was used to insufflate the rectum to test the anastamosis.  No leak was seen.  The anastamosis was at approximately 15  cm.  No bleeding was seen.    The colon protocol was followed.  The abdomen was irrigated with antibiotic irrigation.  The peritoneum was closed with 0-0 vicryl running suture.  Exparel was placed into the preperitoneal space. The fascia was closed with running #1 PDS suture.  The skin was irrigated.  The port sites were closed with 4-0 monocryl sutures and sterile dressings.  The midline wound was closed with 3-0 vicryl deep dermal sutures and 4-0 monocryl running subcuticular suture. dressed with a honeycomb dressing.    Instrument, sponge, and needle counts were correct prior to abdominal closure and at the conclusion of the case.   Findings: inflamed distal sigmoid adhesed to itself.  Evidence of prior abscess seen at aortic bifurcation.    Estimated Blood Loss: less than 100 mL             Specimens: rectosigmoid            Complications: None; patient tolerated the procedure well.         Disposition: PACU - hemodynamically stable.         Condition: stable

## 2018-01-18 NOTE — H&P (Signed)
Caleb Evans is an 67 y.o. male.   Chief Complaint: recurrent diverticulitis HPI: Pt is a 67 yo M who has had multiple episodes of diverticulitis.  I first saw him in 2016 and discussed colectomy.  However, he developed a dx of urothelial carcinoma and this took precedence for a while.  A follow up CT showed thickening in the colon and a c-scope was done.  This was negative for cancer, but he had a stricture in the sigmoid in the area of diverticuli.  He presents for resection.    Past Medical History:  Diagnosis Date  . AAA (abdominal aortic aneurysm) ([place])   . Adrenal nodule ([place])   . Asthma   . Bladder mass   . Bladder neoplasm   . Cancer [facility])    bladder cancer  . COPD (chronic obstructive pulmonary disease) ([place])   . Crohn's disease ([place])   . Diverticulitis   . GERD (gastroesophageal reflux disease)    takes Nexium off and on  . Over weight   . Skin lesion   . Thoracic aortic aneurysm ([place])   . Weak urinary stream     Past Surgical History:  Procedure Laterality Date  . APPENDECTOMY    . CHOLECYSTECTOMY    . TONSILLECTOMY      Family History  Adopted: Yes  Problem Relation Age of Onset  . Bladder Cancer Neg Hx   . Kidney cancer Neg Hx   . Prostate cancer Neg Hx   . Allergic rhinitis Neg Hx   . Angioedema Neg Hx   . Asthma Neg Hx   . Eczema Neg Hx   . Urticaria Neg Hx   . Immunodeficiency Neg Hx    Social History:  reports that he has been smoking cigarettes.  He has a 153.00 pack-year smoking history. He has never used smokeless tobacco. He reports that he drinks alcohol. He reports that he does not use drugs.  Allergies:  Allergies  Allergen Reactions  . Chlorpheniramine Other (See Comments)    Sinus congestion  . Amoxicillin-Pot Clavulanate Other (See Comments)    UNSPECIFIED REACTION  Has patient had a PCN reaction causing immediate rash, facial/tongue/throat swelling, SOB or lightheadedness with hypotension: No Has patient had a PCN reaction  causing severe rash involving mucus membranes or skin necrosis: No Has patient had a PCN reaction that required hospitalization: No Has patient had a PCN reaction occurring within the last 10 years: Yes If all of the above answers are "NO", then may proceed with Cephalosporin use.  Caused liver failure     Medications Prior to Admission  Medication Sig Dispense Refill  . albuterol (PROAIR HFA) 108 (90 Base) MCG/ACT inhaler Inhale 1-2 puffs into the lungs every 6 (six) hours as needed for wheezing or shortness of breath.    Marland Kitchen atorvastatin (LIPITOR) 80 MG tablet Take 80 mg by mouth at bedtime.    Marland Kitchen azelastine (ASTELIN) 0.1 % nasal spray Place 2 sprays into both nostrils 2 (two) times daily. Use in each nostril as directed 30 mL 5  . benzonatate (TESSALON) 100 MG capsule Take by mouth 2 (two) times daily as needed for cough.    . Calcium Carb-Cholecalciferol (CALCIUM 600+D3 PO) Take 1 tablet by mouth daily.    . Fluticasone Propionate (XHANCE) 93 MCG/ACT EXHU Place 1 puff into both nostrils 2 (two) times daily. 32 mL 12  . Multiple Vitamin (MULTIVITAMIN WITH MINERALS) TABS tablet Take 1 tablet by mouth daily.    Marland Kitchen NON  FORMULARY 1 Dose by Subconjunctival route every Tuesday. ALLERGY SHOTS    . Olopatadine HCl (PATADAY) 0.2 % SOLN Place 1 drop into both eyes daily as needed. (Patient taking differently: Place 1 drop into both eyes daily as needed (for irritated/allergy eyes.). ) 1 Bottle 5  . fluticasone (FLONASE) 50 MCG/ACT nasal spray Place 2 sprays into both nostrils daily. 16 g 12    No results found for this or any previous visit (from the past 48 hour(s)). No results found.  Review of Systems  All other systems reviewed and are negative.   Blood pressure 101/71, pulse 68, temperature (!) 97.5 F (36.4 C), temperature source Oral, resp. rate 18, weight 76.7 kg (169 lb), SpO2 98 %. Physical Exam  Constitutional: He is oriented to person, place, and time. He appears well-developed and  well-nourished. No distress.  HENT:  Head: Normocephalic and atraumatic.  Eyes: Pupils are equal, round, and reactive to light. Conjunctivae are normal.  Neck: Neck supple. No tracheal deviation present.  Cardiovascular: Normal rate.  Respiratory: Effort normal.  GI: Soft.  Musculoskeletal: Normal range of motion.  Neurological: He is alert and oriented to person, place, and time.  Skin: Skin is warm and dry. No rash noted. He is not diaphoretic. No erythema. No pallor.  Psychiatric: He has a normal mood and affect. His behavior is normal. Judgment and thought content normal.     Assessment/Plan Sigmoid diverticular stricture Diverticulitis Diverticuli throughout entire colon.  Plan to resect stricture and sigmoid unless thickening goes higher.   Questions answered.    Stark Klein, MD 01/18/2018, 7:37 AM

## 2018-01-18 NOTE — Anesthesia Preprocedure Evaluation (Addendum)
Anesthesia Evaluation  Patient identified by MRN, date of birth, ID band Patient awake    Reviewed: Allergy & Precautions, NPO status , Patient's Chart, lab work & pertinent test results  Airway Mallampati: I       Dental no notable dental hx. (+) Poor Dentition, Missing,    Pulmonary asthma , COPD, Current Smoker,    Pulmonary exam normal breath sounds clear to auscultation       Cardiovascular Normal cardiovascular exam Rhythm:Regular Rate:Normal     Neuro/Psych negative neurological ROS  negative psych ROS   GI/Hepatic Neg liver ROS,   Endo/Other  negative endocrine ROS  Renal/GU negative Renal ROS     Musculoskeletal   Abdominal Normal abdominal exam  (+)   Peds  Hematology negative hematology ROS (+)   Anesthesia Other Findings   Reproductive/Obstetrics                           Anesthesia Physical Anesthesia Plan  ASA: II  Anesthesia Plan: General   Post-op Pain Management:    Induction: Intravenous  PONV Risk Score and Plan: 3 and Ondansetron and Dexamethasone  Airway Management Planned: Oral ETT  Additional Equipment:   Intra-op Plan:   Post-operative Plan: Extubation in OR  Informed Consent: I have reviewed the patients History and Physical, chart, labs and discussed the procedure including the risks, benefits and alternatives for the proposed anesthesia with the patient or authorized representative who has indicated his/her understanding and acceptance.   Dental advisory given  Plan Discussed with: CRNA and Surgeon  Anesthesia Plan Comments:         Anesthesia Quick Evaluation

## 2018-01-19 LAB — CBC
HEMATOCRIT: 40.9 % (ref 39.0–52.0)
HEMOGLOBIN: 13.8 g/dL (ref 13.0–17.0)
MCH: 31.2 pg (ref 26.0–34.0)
MCHC: 33.7 g/dL (ref 30.0–36.0)
MCV: 92.3 fL (ref 78.0–100.0)
Platelets: 369 10*3/uL (ref 150–400)
RBC: 4.43 MIL/uL (ref 4.22–5.81)
RDW: 14.8 % (ref 11.5–15.5)
WBC: 13.6 10*3/uL — AB (ref 4.0–10.5)

## 2018-01-19 LAB — BASIC METABOLIC PANEL
ANION GAP: 7 (ref 5–15)
BUN: 5 mg/dL — ABNORMAL LOW (ref 6–20)
CALCIUM: 8.8 mg/dL — AB (ref 8.9–10.3)
CO2: 24 mmol/L (ref 22–32)
Chloride: 108 mmol/L (ref 101–111)
Creatinine, Ser: 0.98 mg/dL (ref 0.61–1.24)
GFR calc non Af Amer: >60 mL/min (ref >60)
Glucose, Bld: 133 mg/dL — ABNORMAL HIGH (ref 65–99)
POTASSIUM: 3.8 mmol/L (ref 3.5–5.1)
Sodium: 139 mmol/L (ref 135–145)

## 2018-01-19 MED ORDER — AZELASTINE HCL 0.1 % NA SOLN
2.0000 | Freq: Two times a day (BID) | NASAL | Status: DC
  Administered 2018-01-19 – 2018-01-21 (×3): 2 via NASAL
  Filled 2018-01-19: qty 30

## 2018-01-19 NOTE — Progress Notes (Signed)
1 Day Post-Op   Subjective/Chief Complaint: Had a good night.  Foley removed this AM.  Pain ok.  No flatus or BM yet.    Objective: Vital signs in last 24 hours: Temp:  [97.3 F (36.3 C)-98.7 F (37.1 C)] 98 F (36.7 C) (06/12 0621) Pulse Rate:  [68-89] 68 (06/12 0621) Resp:  [9-16] 15 (06/12 0621) BP: (116-181)/(75-103) 147/90 (06/12 0621) SpO2:  [94 %-100 %] 98 % (06/12 0621) Weight:  [77.6 kg (171 lb 1.2 oz)-79.1 kg (174 lb 6.1 oz)] 79.1 kg (174 lb 6.1 oz) (06/12 0621) Last BM Date: 01/18/18  Intake/Output from previous day: 06/11 0701 - 06/12 0700 In: 4681 [P.O.:786; I.V.:3645; IV Piggyback:250] Out: 8887 [Urine:3820; Blood:150] Intake/Output this shift: Total I/O In: 200 [P.O.:200] Out: -   General appearance: alert, cooperative and no distress Resp: breathing comfortably Cardio: regular rate and rhythm GI: soft, non distended, wounds c/d/i.   Extremities: extremities normal, atraumatic, no cyanosis or edema  Lab Results:  Recent Labs    01/18/18 2040 01/19/18 0606  WBC 12.0* 13.6*  HGB 14.1 13.8  HCT 42.4 40.9  PLT 372 369   BMET Recent Labs    01/18/18 1545 01/19/18 0606  NA  --  139  K  --  3.8  CL  --  108  CO2  --  24  GLUCOSE  --  133*  BUN  --  5*  CREATININE 0.83 0.98  CALCIUM  --  8.8*   PT/INR No results for input(s): LABPROT, INR in the last 72 hours. ABG No results for input(s): PHART, HCO3 in the last 72 hours.  Invalid input(s): PCO2, PO2  Studies/Results: No results found.  Anti-infectives: Anti-infectives (From admission, onward)   Start     Dose/Rate Route Frequency Ordered Stop   01/18/18 2000  cefoTEtan in Dextrose 5% (CEFOTAN) IVPB 2 g     2 g 100 mL/hr over 30 Minutes Intravenous Every 12 hours 01/18/18 1325 01/18/18 2049   01/18/18 0845  cefoTEtan in Dextrose 5% (CEFOTAN) IVPB 2 g     2 g 100 mL/hr over 30 Minutes Intravenous  Once 01/18/18 0745 01/18/18 1009   01/18/18 0749  sodium chloride 0.9 % with cefoTEtan  (CEFOTAN) ADS Med    Note to Pharmacy:  Ardine Eng   : cabinet override      01/18/18 0749 01/18/18 1959      Assessment/Plan: s/p Procedure(s): LAPAROSCOPIC PARTIAL COLECTOMY ERAS PATHWAY (N/A) d/c foley  OOB, ambulate Restart inhalers. Advancing diet as tolerated.   Await return of bowel function.   LOS: 1 day    Stark Klein 01/19/2018

## 2018-01-19 NOTE — Progress Notes (Signed)
Nutrition Brief Note  Patient identified on the Malnutrition Screening Tool (MST) Report  Wt Readings from Last 15 Encounters:  01/19/18 174 lb 6.1 oz (79.1 kg)  01/10/18 169 lb 9.6 oz (76.9 kg)  11/22/17 171 lb (77.6 kg)  10/14/17 173 lb (78.5 kg)  10/04/17 173 lb (78.5 kg)  07/09/17 174 lb 11.2 oz (79.2 kg)  01/12/17 179 lb 6.4 oz (81.4 kg)  07/08/16 177 lb (80.3 kg)  06/03/16 181 lb 6.4 oz (82.3 kg)  04/29/16 174 lb 9.6 oz (79.2 kg)  04/09/16 174 lb 9.6 oz (79.2 kg)  04/08/16 175 lb 3.2 oz (79.5 kg)  03/25/16 180 lb (81.6 kg)  12/26/15 178 lb 6.4 oz (80.9 kg)  09/26/15 174 lb 14.4 oz (79.3 kg)   Pt is a 67 yo M who has had multiple episodes of diverticulitis.   Pt admitted with sigmoid diverticular stricture.   6/12- s/p laparoscopic sigmoid colectomy  Reviewed wt hx; wt has been stable over the past year.   Labs reviewed.   Body mass index is 26.51 kg/m. Patient meets criteria for overweight based on current BMI.   Current diet order is full liquid, patient is consuming approximately 70% of meals at this time. Labs and medications reviewed.   No nutrition interventions warranted at this time. If nutrition issues arise, please consult RD.   Chiquetta Langner A. Jimmye Norman, RD, LDN, CDE Pager: [phone] After hours Pager: [phone]

## 2018-01-19 NOTE — Anesthesia Postprocedure Evaluation (Signed)
Anesthesia Post Note  Patient: Caleb Evans  Procedure(s) Performed: LAPAROSCOPIC PARTIAL COLECTOMY ERAS PATHWAY (N/A Abdomen)     Patient location during evaluation: PACU Anesthesia Type: General Level of consciousness: awake Pain management: pain level controlled Vital Signs Assessment: post-procedure vital signs reviewed and stable Respiratory status: spontaneous breathing Cardiovascular status: stable Postop Assessment: no apparent nausea or vomiting Anesthetic complications: no    Last Vitals:  Vitals:   01/19/18 0621 01/19/18 1407  BP: (!) 147/90 (!) 147/81  Pulse: 68 73  Resp: 15   Temp: 36.7 C 36.5 C  SpO2: 98% 93%    Last Pain:  Vitals:   01/19/18 1647  TempSrc:   PainSc: 7    Pain Goal:                 Jakaria Lavergne JR,JOHN Trula Frede

## 2018-01-19 NOTE — Progress Notes (Signed)
ERAS education reinforced. Did patient attend class prior to procedure? Yes [ x  ] No [   ] Discussed: Pain Control [ x  ] Mobility [ x  ] Diet [x   ] Other [   ]  Patient doing well. States has walked in hallways and sat in chair today. Tolerating full liquids- discussed with RN about advancing diet. RN states order is just to advance to full liquids. Pain is well controlled.  Pecolia Ades, RN, BSN Quality Program Coordinator, Enhanced Recovery after Surgery 01/19/18 4:31 PM

## 2018-01-19 NOTE — Plan of Care (Signed)
  Problem: Education: Goal: Knowledge of General Education information will improve Outcome: Progressing   Problem: Health Behavior/Discharge Planning: Goal: Ability to manage health-related needs will improve Outcome: Progressing   Problem: Clinical Measurements: Goal: Ability to maintain clinical measurements within normal limits will improve Outcome: Progressing   Problem: Nutrition: Goal: Adequate nutrition will be maintained Outcome: Progressing

## 2018-01-20 ENCOUNTER — Encounter (HOSPITAL_COMMUNITY): Payer: Self-pay | Admitting: General Surgery

## 2018-01-20 LAB — CBC
HCT: 37.1 % — ABNORMAL LOW (ref 39.0–52.0)
HEMOGLOBIN: 12.4 g/dL — AB (ref 13.0–17.0)
MCH: 31.5 pg (ref 26.0–34.0)
MCHC: 33.4 g/dL (ref 30.0–36.0)
MCV: 94.2 fL (ref 78.0–100.0)
PLATELETS: 291 10*3/uL (ref 150–400)
RBC: 3.94 MIL/uL — ABNORMAL LOW (ref 4.22–5.81)
RDW: 14.6 % (ref 11.5–15.5)
WBC: 11.1 10*3/uL — ABNORMAL HIGH (ref 4.0–10.5)

## 2018-01-20 LAB — BASIC METABOLIC PANEL
Anion gap: 9 (ref 5–15)
BUN: 5 mg/dL — AB (ref 6–20)
CALCIUM: 8.6 mg/dL — AB (ref 8.9–10.3)
CHLORIDE: 106 mmol/L (ref 101–111)
CO2: 25 mmol/L (ref 22–32)
CREATININE: 0.9 mg/dL (ref 0.61–1.24)
Glucose, Bld: 121 mg/dL — ABNORMAL HIGH (ref 65–99)
Potassium: 3.4 mmol/L — ABNORMAL LOW (ref 3.5–5.1)
SODIUM: 140 mmol/L (ref 135–145)

## 2018-01-20 MED ORDER — POTASSIUM CHLORIDE CRYS ER 20 MEQ PO TBCR
40.0000 meq | EXTENDED_RELEASE_TABLET | Freq: Two times a day (BID) | ORAL | Status: DC
  Administered 2018-01-20 – 2018-01-21 (×3): 40 meq via ORAL
  Filled 2018-01-20 (×3): qty 2

## 2018-01-20 NOTE — Progress Notes (Signed)
2 Days Post-Op   Subjective/Chief Complaint: 3 BMs.  Passing flatus, still with some belching.    Objective: Vital signs in last 24 hours: Temp:  [97.7 F (36.5 C)-98.5 F (36.9 C)] 98.5 F (36.9 C) (06/13 1419) Pulse Rate:  [64-69] 68 (06/13 1419) Resp:  [17-18] 18 (06/13 1419) BP: (131-165)/(82-99) 131/82 (06/13 1419) SpO2:  [95 %-100 %] 98 % (06/13 1419) Weight:  [79.1 kg (174 lb 6.4 oz)] 79.1 kg (174 lb 6.4 oz) (06/13 0537) Last BM Date: 01/20/18  Intake/Output from previous day: 06/12 0701 - 06/13 0700 In: 2680 [P.O.:800; I.V.:1880] Out: 2003 [Urine:2000; Stool:3] Intake/Output this shift: Total I/O In: 600 [P.O.:600] Out: 1100 [Urine:1100]  General appearance: alert, cooperative and no distress Resp: breathing comfortably Cardio: regular rate and rhythm GI: soft, mild bloating, wounds c/d/i.   Extremities: extremities normal, atraumatic, no cyanosis or edema  Lab Results:  Recent Labs    01/19/18 0606 01/20/18 0622  WBC 13.6* 11.1*  HGB 13.8 12.4*  HCT 40.9 37.1*  PLT 369 291   BMET Recent Labs    01/19/18 0606 01/20/18 0622  NA 139 140  K 3.8 3.4*  CL 108 106  CO2 24 25  GLUCOSE 133* 121*  BUN 5* 5*  CREATININE 0.98 0.90  CALCIUM 8.8* 8.6*   PT/INR No results for input(s): LABPROT, INR in the last 72 hours. ABG No results for input(s): PHART, HCO3 in the last 72 hours.  Invalid input(s): PCO2, PO2  Studies/Results: No results found.  Anti-infectives: Anti-infectives (From admission, onward)   Start     Dose/Rate Route Frequency Ordered Stop   01/18/18 2000  cefoTEtan in Dextrose 5% (CEFOTAN) IVPB 2 g     2 g 100 mL/hr over 30 Minutes Intravenous Every 12 hours 01/18/18 1325 01/18/18 2049   01/18/18 0845  cefoTEtan in Dextrose 5% (CEFOTAN) IVPB 2 g     2 g 100 mL/hr over 30 Minutes Intravenous  Once 01/18/18 0745 01/18/18 1009   01/18/18 0749  sodium chloride 0.9 % with cefoTEtan (CEFOTAN) ADS Med    Note to Pharmacy:  Ardine Eng   : cabinet override      01/18/18 0749 01/18/18 1959      Assessment/Plan: s/p Procedure(s): LAPAROSCOPIC PARTIAL COLECTOMY ERAS PATHWAY (N/A) Soft diet. D/c in AM if tolerates without n/v and still having gas.     LOS: 2 days    Stark Klein 01/20/2018

## 2018-01-20 NOTE — Plan of Care (Signed)
  Problem: Education: Goal: Knowledge of General Education information will improve Outcome: Progressing   Problem: Health Behavior/Discharge Planning: Goal: Ability to manage health-related needs will improve Outcome: Progressing   Problem: Clinical Measurements: Goal: Ability to maintain clinical measurements within normal limits will improve Outcome: Progressing Goal: Will remain free from infection Outcome: Progressing Goal: Diagnostic test results will improve Outcome: Progressing Goal: Respiratory complications will improve Outcome: Progressing Goal: Cardiovascular complication will be avoided Outcome: Progressing   Problem: Activity: Goal: Risk for activity intolerance will decrease Outcome: Progressing

## 2018-01-21 LAB — CBC
HCT: 39.2 % (ref 39.0–52.0)
Hemoglobin: 13.1 g/dL (ref 13.0–17.0)
MCH: 30.8 pg (ref 26.0–34.0)
MCHC: 33.4 g/dL (ref 30.0–36.0)
MCV: 92 fL (ref 78.0–100.0)
PLATELETS: 338 10*3/uL (ref 150–400)
RBC: 4.26 MIL/uL (ref 4.22–5.81)
RDW: 14.6 % (ref 11.5–15.5)
WBC: 12.2 10*3/uL — AB (ref 4.0–10.5)

## 2018-01-21 LAB — BASIC METABOLIC PANEL
ANION GAP: 7 (ref 5–15)
BUN: 10 mg/dL (ref 6–20)
CO2: 24 mmol/L (ref 22–32)
Calcium: 9.2 mg/dL (ref 8.9–10.3)
Chloride: 108 mmol/L (ref 101–111)
Creatinine, Ser: 0.95 mg/dL (ref 0.61–1.24)
Glucose, Bld: 102 mg/dL — ABNORMAL HIGH (ref 65–99)
POTASSIUM: 3.9 mmol/L (ref 3.5–5.1)
SODIUM: 139 mmol/L (ref 135–145)

## 2018-01-21 MED ORDER — OXYCODONE HCL 5 MG PO TABS: 5.0000 mg | ORAL_TABLET | ORAL | 0 refills | Status: DC | PRN

## 2018-01-21 MED ORDER — SACCHAROMYCES BOULARDII 250 MG PO CAPS: 250.0000 mg | ORAL_CAPSULE | Freq: Two times a day (BID) | ORAL | 0 refills | Status: DC

## 2018-01-21 MED ORDER — GABAPENTIN 300 MG PO CAPS: 300.0000 mg | ORAL_CAPSULE | Freq: Two times a day (BID) | ORAL | 0 refills | Status: DC

## 2018-01-21 NOTE — Progress Notes (Signed)
Caleb Evans to be D/C'd  per MD order. Discussed with the patient and all questions fully answered.  VSS, Skin clean, dry and intact without evidence of skin break down, no evidence of skin tears noted.  IV catheter discontinued intact. Site without signs and symptoms of complications. Dressing and pressure applied.  An After Visit Summary was printed and given to the patient. Patient received prescription.  D/c education completed with patient/family including follow up instructions, medication list, d/c activities limitations if indicated, with other d/c instructions as indicated by MD - patient able to verbalize understanding, all questions fully answered.   Patient instructed to return to ED, call 911, or call MD for any changes in condition.   Patient to be escorted via [facility], and D/C home via private auto.

## 2018-01-21 NOTE — Plan of Care (Signed)
  Problem: Pain Managment: Goal: General experience of comfort will improve Outcome: Adequate for Discharge   Problem: Clinical Measurements: Goal: Ability to maintain clinical measurements within normal limits will improve Outcome: Adequate for Discharge

## 2018-01-21 NOTE — Discharge Summary (Signed)
Physician Discharge Summary  Patient ID: Caleb Evans MRN: [id number] DOB/AGE: [birth year] 67 y.o.  Admit date: 01/18/2018 Discharge date: 01/21/2018  Admission Diagnoses: Patient Active Problem List   Diagnosis Date Noted  . Sigmoid diverticulitis 01/18/2018  . Aneurysm of thoracic aorta ([place]) 11/22/2017  . Hyperlipidemia 11/22/2017  . AAA (abdominal aortic aneurysm) without rupture ([place]) 11/22/2017  . Seasonal and perennial allergic rhinitis 04/19/2017  . Severe persistent asthma, uncomplicated 35/36/1443  . GERD (gastroesophageal reflux disease) 04/08/2016  . Tobacco use 04/08/2016  . Chronic nonseasonal allergic rhinitis due to fungal spores 01/17/2014  . COPD, mild ([place]) 01/17/2014  . 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  He did very well post op, advancing per the ERAS protocol.  On POD 2, he had some mild bloating, but this resolved.  He needed very little pain medication.  As expected, he had some loose stools following removal of obstruction, bowel cleanout, and liquid diet.  This is improving.  He is ambulatory and voiding independently.  He is tolerating soft diet and oral meds.  He is discharged in stable condition.    Consults: None  Significant Diagnostic Studies: labs: HCT prior to d/c 39.2, Cr 0.95  Treatments: surgery: see above  Discharge Exam: Blood pressure (!) 135/93, pulse 73, temperature 97.8 F (36.6 C), temperature source Oral, resp. rate 18, height 5' 8"  (1.727 m), weight 78.3 kg (172 lb 9.9 oz), SpO2 98 %. General appearance: alert, cooperative and no distress Resp: breathing comfortably GI: soft, non distended, incision c/d/i.  no erythema or  drainage. Extremities: extremities normal, atraumatic, no cyanosis or edema  Disposition: Discharge disposition: 01-Home or Self Care       Discharge Instructions    Call MD for:  difficulty breathing, headache or visual disturbances   Complete by:  As directed    Call MD for:  persistant dizziness or light-headedness   Complete by:  As directed    Call MD for:  persistant nausea and vomiting   Complete by:  As directed    Call MD for:  redness, tenderness, or signs of infection (pain, swelling, redness, odor or green/yellow discharge around incision site)   Complete by:  As directed    Call MD for:  severe uncontrolled pain   Complete by:  As directed    Call MD for:  temperature >100.4   Complete by:  As directed    Diet - low sodium heart healthy   Complete by:  As directed    Increase activity slowly   Complete by:  As directed      Allergies as of 01/21/2018      Reactions   Chlorpheniramine Other (See Comments)   Sinus congestion   Amoxicillin-pot Clavulanate Other (See Comments)   UNSPECIFIED REACTION  Has patient had a PCN reaction causing immediate rash, facial/tongue/throat swelling, SOB or lightheadedness with hypotension: No Has patient had a PCN reaction causing severe rash involving mucus membranes or skin necrosis: No Has patient had a PCN reaction that required hospitalization: No Has patient had a PCN reaction occurring within the last 10 years: Yes If all of the above answers are "NO", then may proceed with Cephalosporin use. Caused liver failure       Medication List  TAKE these medications   atorvastatin 80 MG tablet Commonly known as:  LIPITOR Take 80 mg by mouth at bedtime.   azelastine 0.1 % nasal spray Commonly known as:  ASTELIN Place 2 sprays into both nostrils 2 (two) times daily. Use in each nostril as directed   benzonatate 100 MG capsule Commonly known as:  TESSALON Take by mouth 2 (two) times daily as needed for cough.    CALCIUM 600+D3 PO Take 1 tablet by mouth daily.   fluticasone 50 MCG/ACT nasal spray Commonly known as:  FLONASE Place 2 sprays into both nostrils daily.   Fluticasone Propionate 93 MCG/ACT Exhu Commonly known as:  XHANCE Place 1 puff into both nostrils 2 (two) times daily.   gabapentin 300 MG capsule Commonly known as:  NEURONTIN Take 1 capsule (300 mg total) by mouth 2 (two) times daily.   multivitamin with minerals Tabs tablet Take 1 tablet by mouth daily.   NON FORMULARY 1 Dose by Subconjunctival route every Tuesday. ALLERGY SHOTS   Olopatadine HCl 0.2 % Soln Commonly known as:  PATADAY Place 1 drop into both eyes daily as needed. What changed:  reasons to take this   oxyCODONE 5 MG immediate release tablet Commonly known as:  Oxy IR/ROXICODONE Take 1-2 tablets (5-10 mg total) by mouth every 4 (four) hours as needed for moderate pain.   PROAIR HFA 108 (90 Base) MCG/ACT inhaler Generic drug:  albuterol Inhale 1-2 puffs into the lungs every 6 (six) hours as needed for wheezing or shortness of breath.   saccharomyces boulardii 250 MG capsule Commonly known as:  FLORASTOR Take 1 capsule (250 mg total) by mouth 2 (two) times daily.      Follow-up Information    Stark Klein, MD In 2 weeks.   Specialty:  General Surgery Contact information: [address] [place] [address] [phone]           Signed: Stark Klein 01/21/2018, 8:56 AM

## 2018-01-25 ENCOUNTER — Ambulatory Visit (INDEPENDENT_AMBULATORY_CARE_PROVIDER_SITE_OTHER): Payer: Medicare Other | Admitting: *Deleted

## 2018-01-25 DIAGNOSIS — J309 Allergic rhinitis, unspecified: Secondary | ICD-10-CM

## 2018-02-01 ENCOUNTER — Ambulatory Visit (INDEPENDENT_AMBULATORY_CARE_PROVIDER_SITE_OTHER): Payer: Medicare Other | Admitting: *Deleted

## 2018-02-01 DIAGNOSIS — J309 Allergic rhinitis, unspecified: Secondary | ICD-10-CM | POA: Diagnosis not present

## 2018-02-15 ENCOUNTER — Other Ambulatory Visit: Payer: Self-pay | Admitting: Allergy & Immunology

## 2018-02-15 ENCOUNTER — Ambulatory Visit (INDEPENDENT_AMBULATORY_CARE_PROVIDER_SITE_OTHER): Payer: Medicare Other

## 2018-02-15 DIAGNOSIS — J309 Allergic rhinitis, unspecified: Secondary | ICD-10-CM | POA: Diagnosis not present

## 2018-02-17 ENCOUNTER — Other Ambulatory Visit: Payer: Self-pay

## 2018-02-17 DIAGNOSIS — J309 Allergic rhinitis, unspecified: Principal | ICD-10-CM

## 2018-02-17 DIAGNOSIS — H1010 Acute atopic conjunctivitis, unspecified eye: Secondary | ICD-10-CM

## 2018-02-17 MED ORDER — OLOPATADINE HCL 0.2 % OP SOLN: 1.0000 [drp] | Freq: Every day | OPHTHALMIC | 1 refills | Status: DC | PRN

## 2018-02-20 ENCOUNTER — Emergency Department (HOSPITAL_COMMUNITY): Payer: Medicare Other

## 2018-02-20 ENCOUNTER — Encounter (HOSPITAL_COMMUNITY): Payer: Self-pay | Admitting: Emergency Medicine

## 2018-02-20 ENCOUNTER — Emergency Department (HOSPITAL_COMMUNITY)
Admission: EM | Admit: 2018-02-20 | Discharge: 2018-02-20 | Disposition: A | Discharge status: Home / Self Care | Payer: Medicare Other | Attending: Emergency Medicine | Admitting: Emergency Medicine

## 2018-02-20 ENCOUNTER — Other Ambulatory Visit: Payer: Self-pay

## 2018-02-20 DIAGNOSIS — F17210 Nicotine dependence, cigarettes, uncomplicated: Secondary | ICD-10-CM | POA: Insufficient documentation

## 2018-02-20 DIAGNOSIS — Z8551 Personal history of malignant neoplasm of bladder: Secondary | ICD-10-CM | POA: Diagnosis not present

## 2018-02-20 DIAGNOSIS — J4551 Severe persistent asthma with (acute) exacerbation: Secondary | ICD-10-CM | POA: Insufficient documentation

## 2018-02-20 DIAGNOSIS — J40 Bronchitis, not specified as acute or chronic: Secondary | ICD-10-CM

## 2018-02-20 DIAGNOSIS — R0602 Shortness of breath: Secondary | ICD-10-CM | POA: Diagnosis present

## 2018-02-20 DIAGNOSIS — Z79899 Other long term (current) drug therapy: Secondary | ICD-10-CM | POA: Insufficient documentation

## 2018-02-20 LAB — CBC WITH DIFFERENTIAL/PLATELET
BASOS PCT: 0 %
Basophils Absolute: 0 10*3/uL (ref 0.0–0.1)
Eosinophils Absolute: 1 10*3/uL — ABNORMAL HIGH (ref 0.0–0.7)
Eosinophils Relative: 11 %
HEMATOCRIT: 42.6 % (ref 39.0–52.0)
Hemoglobin: 14.5 g/dL (ref 13.0–17.0)
LYMPHS PCT: 31 %
Lymphs Abs: 2.8 10*3/uL (ref 0.7–4.0)
MCH: 31.7 pg (ref 26.0–34.0)
MCHC: 34 g/dL (ref 30.0–36.0)
MCV: 93.2 fL (ref 78.0–100.0)
MONO ABS: 0.5 10*3/uL (ref 0.1–1.0)
MONOS PCT: 6 %
Neutro Abs: 4.9 10*3/uL (ref 1.7–7.7)
Neutrophils Relative %: 52 %
Platelets: 322 10*3/uL (ref 150–400)
RBC: 4.57 MIL/uL (ref 4.22–5.81)
RDW: 14.5 % (ref 11.5–15.5)
WBC: 9.2 10*3/uL (ref 4.0–10.5)

## 2018-02-20 LAB — BASIC METABOLIC PANEL
Anion gap: 7 (ref 5–15)
BUN: 9 mg/dL (ref 8–23)
CALCIUM: 9.3 mg/dL (ref 8.9–10.3)
CO2: 29 mmol/L (ref 22–32)
CREATININE: 0.92 mg/dL (ref 0.61–1.24)
Chloride: 103 mmol/L (ref 98–111)
GFR calc Af Amer: >60 mL/min (ref >60)
GFR calc non Af Amer: >60 mL/min (ref >60)
Glucose, Bld: 97 mg/dL (ref 70–99)
Potassium: 3.7 mmol/L (ref 3.5–5.1)
Sodium: 139 mmol/L (ref 135–145)

## 2018-02-20 MED ORDER — IPRATROPIUM-ALBUTEROL 0.5-2.5 (3) MG/3ML IN SOLN
3.0000 mL | Freq: Once | RESPIRATORY_TRACT | Status: DC
  Filled 2018-02-20: qty 3

## 2018-02-20 MED ORDER — PREDNISONE 20 MG PO TABS: ORAL_TABLET | ORAL | 0 refills | Status: DC

## 2018-02-20 MED ORDER — DOXYCYCLINE HYCLATE 100 MG PO CAPS: 100.0000 mg | ORAL_CAPSULE | Freq: Two times a day (BID) | ORAL | 0 refills | Status: DC

## 2018-02-20 MED ORDER — VARENICLINE TARTRATE 0.5 MG X 11 & 1 MG X 42 PO MISC: ORAL | 0 refills | Status: DC

## 2018-02-20 MED ORDER — PREDNISONE 50 MG PO TABS
60.0000 mg | ORAL_TABLET | Freq: Once | ORAL | Status: AC
  Administered 2018-02-20: 60 mg via ORAL
  Filled 2018-02-20: qty 1

## 2018-02-20 MED ORDER — IPRATROPIUM-ALBUTEROL 0.5-2.5 (3) MG/3ML IN SOLN
3.0000 mL | RESPIRATORY_TRACT | Status: DC
  Administered 2018-02-20 (×2): 3 mL via RESPIRATORY_TRACT
  Filled 2018-02-20: qty 3

## 2018-02-20 MED ORDER — DOXYCYCLINE HYCLATE 100 MG PO TABS
100.0000 mg | ORAL_TABLET | Freq: Once | ORAL | Status: AC
  Administered 2018-02-20: 100 mg via ORAL
  Filled 2018-02-20: qty 1

## 2018-02-20 NOTE — ED Triage Notes (Signed)
Pt states that he has been having trouble with his allergies. He has been having sob for the past 3 days. He is coughing up green phelgem and it hurts to breath.

## 2018-02-20 NOTE — ED Provider Notes (Signed)
Emergency Department Provider Note   I have reviewed the triage vital signs and the nursing notes.   HISTORY  Chief Complaint Shortness of Breath   HPI Caleb Evans is a 67 y.o. male smoker with multiple medical problems as documented below presents to the emergency department today for productive cough and shortness of breath.  Patient states he had abdominal surgery for diverticulosis about a month ago and ever since that time he had a persistently worsening cough associated with shortness of breath.  He denies inhalers and Tessalon Perles which have helped some but not totally alleviated his symptoms.  No fevers.  States that the productive cough is either greenish or yellowish in color.  No lower extremity swelling.  No chest pain or back pain.  No recent travels.  No known history of heart disease. No other associated or modifying symptoms.    Past Medical History:  Diagnosis Date  . AAA (abdominal aortic aneurysm) ([place])   . Adrenal nodule ([place])   . Asthma   . Bladder mass   . Bladder neoplasm   . Cancer [facility])    bladder cancer  . COPD (chronic obstructive pulmonary disease) ([place])   . Crohn's disease ([place])   . Diverticulitis   . GERD (gastroesophageal reflux disease)    takes Nexium off and on  . Over weight   . Skin lesion   . Thoracic aortic aneurysm ([place])   . Weak urinary stream     Patient Active Problem List   Diagnosis Date Noted  . Sigmoid diverticulitis 01/18/2018  . Aneurysm of thoracic aorta ([place]) 11/22/2017  . Hyperlipidemia 11/22/2017  . AAA (abdominal aortic aneurysm) without rupture ([place]) 11/22/2017  . Seasonal and perennial allergic rhinitis 04/19/2017  . Severe persistent asthma, uncomplicated 44/08/270  . GERD (gastroesophageal reflux disease) 04/08/2016  . Tobacco use 04/08/2016  . Chronic nonseasonal allergic rhinitis due to fungal spores 01/17/2014  . COPD, mild ([place]) 01/17/2014  . Mild chronic obstructive pulmonary disease ([place])  01/17/2014    Past Surgical History:  Procedure Laterality Date  . APPENDECTOMY    . CHOLECYSTECTOMY    . LAPAROSCOPIC PARTIAL COLECTOMY  01/18/2018   ERAS PATHWAY  . LAPAROSCOPIC PARTIAL COLECTOMY N/A 01/18/2018   Procedure: LAPAROSCOPIC PARTIAL COLECTOMY ERAS PATHWAY;  Surgeon: Stark Klein, MD;  Location: [facility];  Service: General;  Laterality: N/A;  . TONSILLECTOMY      Current Outpatient Rx  . Order #: [id number] Class: Historical Med  . Order #: [id number] Class: Historical Med  . Order #: [id number] Class: Normal  . Order #: [id number] Class: Historical Med  . Order #: [id number] Class: Historical Med  . Order #: [id number] Class: Normal  . Order #: [id number] Class: Historical Med  . Order #: [id number] Class: Historical Med  . Order #: [id number] Class: Normal  . Order #: [id number] Class: Normal  . Order #: [id number] Class: Print  . Order #: [id number] Class: Historical Med  . Order #: [id number] Class: Print  . Order #: [id number] Class: Print    Allergies Chlorpheniramine and Amoxicillin-pot clavulanate  Family History  Adopted: Yes  Problem Relation Age of Onset  . Bladder Cancer Neg Hx   . Kidney cancer Neg Hx   . Prostate cancer Neg Hx   . Allergic rhinitis Neg Hx   . Angioedema Neg Hx   . Asthma Neg Hx   . Eczema Neg Hx   . Urticaria Neg Hx   . Immunodeficiency Neg Hx     Social History Social History   Tobacco Use  . Smoking  status: Current Every Day Smoker    Packs/day: 0.50    Years: 51.00    Pack years: 25.50    Types: Cigarettes  . Smokeless tobacco: Never Used  . Tobacco comment: "down to 10 cigarettes" "trying to quit"  Substance Use Topics  . Alcohol use: Yes    Alcohol/week: 0.0 oz    Comment: occasional  . Drug use: No    Review of Systems  All other systems negative except as documented in the HPI. All pertinent positives and negatives as reviewed in the HPI. ____________________________________________   PHYSICAL EXAM:  VITAL SIGNS: ED Triage  Vitals  Enc Vitals Group     BP 02/20/18 1624 135/86     Pulse Rate 02/20/18 1624 73     Resp 02/20/18 1624 18     Temp 02/20/18 1624 97.6 F (36.4 C)     Temp Source 02/20/18 1624 Oral     SpO2 02/20/18 1624 92 %     Weight 02/20/18 1625 167 lb (75.8 kg)     Height 02/20/18 1625 5' 8"  (1.727 m)     Head Circumference --      Peak Flow --      Pain Score 02/20/18 1625 9     Pain Loc --      Pain Edu? --      Excl. in [place]? --     Constitutional: Alert and oriented. Well appearing and in no acute distress. Eyes: Conjunctivae are normal. PERRL. EOMI. Head: Atraumatic. Nose: No congestion/rhinnorhea. Mouth/Throat: Mucous membranes are moist.  Oropharynx non-erythematous. Neck: No stridor.  No meningeal signs.   Cardiovascular: Normal rate, regular rhythm. Good peripheral circulation. Grossly normal heart sounds.   Respiratory: Normal respiratory effort.  No retractions. Lungs with wheezing and rhonchi on L, clear on right. Gastrointestinal: Soft and nontender. No distention.  Musculoskeletal: No lower extremity tenderness nor edema. No gross deformities of extremities. Neurologic:  Normal speech and language. No gross focal neurologic deficits are appreciated.  Skin:  Skin is warm, dry and intact. No rash noted.   ____________________________________________   LABS (all labs ordered are listed, but only abnormal results are displayed)  Labs Reviewed  CBC WITH DIFFERENTIAL/PLATELET - Abnormal; Notable for the following components:      Result Value   Eosinophils Absolute 1.0 (*)    All other components within normal limits  BASIC METABOLIC PANEL   ____________________________________________  EKG   EKG Interpretation  Date/Time:  Sunday February 20 2018 18:56:00 EDT Ventricular Rate:  71 PR Interval:    QRS Duration: 114 QT Interval:  424 QTC Calculation: 461 R Axis:   76 Text Interpretation:  Sinus rhythm Borderline intraventricular conduction delay RSR' in V1 or V2,  probably normal variant No old tracing to compare Confirmed by Merrily Pew [phone]) on 02/20/2018 7:02:32 PM       ____________________________________________  RADIOLOGY  Dg Chest 2 View  Result Date: 02/20/2018 CLINICAL DATA:  Short of breath and cough for 3 days. EXAM: CHEST - 2 VIEW COMPARISON:  CT, 10/14/2017 FINDINGS: Cardiac silhouette is normal in size. No mediastinal or hilar masses. No evidence of adenopathy. Stable scarring at the right apex. Lungs are hyperexpanded but otherwise clear. No pleural effusion or pneumothorax. Skeletal structures are intact. IMPRESSION: No active cardiopulmonary disease. Electronically Signed   By: Lajean Manes M.D.   On: 02/20/2018 16:57    ____________________________________________   PROCEDURES  Procedure(s) performed:   Procedures    Smoking Cessation Counseling:  Counseled  patient for approximately 7 minutes regarding smoking cessation. Discussed risks of smoking and how they applied and affected their visit here today. Patient ready to quit at this time and elects to try Chantix. Will continue following with primary doctor for further management.   CPT code: [id number]: intermediate counseling for smoking cessation  ____________________________________________   INITIAL IMPRESSION / ASSESSMENT AND PLAN / ED COURSE  Suspect bronchitis/COPD exacerbation as cause of his symptoms.  Patient is willing to quit smoking so we will start Chantix as well.  Patient with significant improvement in his coughing and his breathing with DuoNeb and steroids with antibiotics.  I think it is unlikely to be PE at this time secondary to response and no other supporting factors.     Pertinent labs & imaging results that were available during my care of the patient were reviewed by me and considered in my medical decision making (see chart for details).  ____________________________________________  FINAL CLINICAL IMPRESSION(S) / ED DIAGNOSES  Final  diagnoses:  Bronchitis     MEDICATIONS GIVEN DURING THIS VISIT:  Medications  ipratropium-albuterol (DUONEB) 0.5-2.5 (3) MG/3ML nebulizer solution 3 mL (3 mLs Nebulization Given 02/20/18 1957)  ipratropium-albuterol (DUONEB) 0.5-2.5 (3) MG/3ML nebulizer solution 3 mL (has no administration in time range)  predniSONE (DELTASONE) tablet 60 mg (60 mg Oral Given 02/20/18 1907)  doxycycline (VIBRA-TABS) tablet 100 mg (100 mg Oral Given 02/20/18 1907)     NEW OUTPATIENT MEDICATIONS STARTED DURING THIS VISIT:  New Prescriptions   DOXYCYCLINE (VIBRAMYCIN) 100 MG CAPSULE    Take 1 capsule (100 mg total) by mouth 2 (two) times daily. One po bid x 7 days   PREDNISONE (DELTASONE) 20 MG TABLET    2 tabs po daily x 4 days   VARENICLINE (CHANTIX STARTING MONTH PAK) 0.5 MG X 11 & 1 MG X 42 TABLET    Take one 0.5 mg tablet by mouth once daily for 3 days, then increase to one 0.5 mg tablet twice daily for 4 days, then increase to one 1 mg tablet twice daily.    Note:  This note was prepared with assistance of Dragon voice recognition software. Occasional wrong-word or sound-a-like substitutions may have occurred due to the inherent limitations of voice recognition software.   Merrily Pew, MD 02/20/18 1958

## 2018-02-21 ENCOUNTER — Other Ambulatory Visit: Payer: Self-pay | Admitting: Family Medicine

## 2018-02-21 DIAGNOSIS — D494 Neoplasm of unspecified behavior of bladder: Secondary | ICD-10-CM

## 2018-02-23 ENCOUNTER — Ambulatory Visit (HOSPITAL_COMMUNITY): Admission: RE | Admit: 2018-02-23 | Payer: Medicare Other | Source: Ambulatory Visit

## 2018-03-01 ENCOUNTER — Ambulatory Visit (INDEPENDENT_AMBULATORY_CARE_PROVIDER_SITE_OTHER): Payer: Medicare Other | Admitting: *Deleted

## 2018-03-01 DIAGNOSIS — J309 Allergic rhinitis, unspecified: Secondary | ICD-10-CM | POA: Diagnosis not present

## 2018-03-15 ENCOUNTER — Other Ambulatory Visit: Payer: Self-pay | Admitting: *Deleted

## 2018-03-15 ENCOUNTER — Ambulatory Visit (INDEPENDENT_AMBULATORY_CARE_PROVIDER_SITE_OTHER): Payer: Medicare Other | Admitting: *Deleted

## 2018-03-15 DIAGNOSIS — J309 Allergic rhinitis, unspecified: Secondary | ICD-10-CM

## 2018-03-15 MED ORDER — FLUTICASONE PROPIONATE 93 MCG/ACT NA EXHU: 1.0000 | INHALANT_SUSPENSION | Freq: Two times a day (BID) | NASAL | 12 refills | Status: DC

## 2018-03-29 ENCOUNTER — Ambulatory Visit (INDEPENDENT_AMBULATORY_CARE_PROVIDER_SITE_OTHER): Payer: Medicare Other

## 2018-03-29 DIAGNOSIS — J309 Allergic rhinitis, unspecified: Secondary | ICD-10-CM | POA: Diagnosis not present

## 2018-03-30 ENCOUNTER — Encounter: Payer: Self-pay | Admitting: *Deleted

## 2018-03-30 DIAGNOSIS — J3089 Other allergic rhinitis: Secondary | ICD-10-CM | POA: Diagnosis not present

## 2018-03-30 NOTE — Progress Notes (Signed)
Vials made. Exp: 03-31-19. hv

## 2018-04-06 ENCOUNTER — Other Ambulatory Visit: Payer: Self-pay | Admitting: *Deleted

## 2018-04-06 NOTE — Telephone Encounter (Signed)
error 

## 2018-04-12 ENCOUNTER — Ambulatory Visit (INDEPENDENT_AMBULATORY_CARE_PROVIDER_SITE_OTHER): Payer: Medicare Other

## 2018-04-12 ENCOUNTER — Other Ambulatory Visit: Payer: Self-pay | Admitting: Allergy & Immunology

## 2018-04-12 DIAGNOSIS — J3089 Other allergic rhinitis: Principal | ICD-10-CM

## 2018-04-12 DIAGNOSIS — J302 Other seasonal allergic rhinitis: Secondary | ICD-10-CM

## 2018-04-12 DIAGNOSIS — J309 Allergic rhinitis, unspecified: Secondary | ICD-10-CM

## 2018-04-14 ENCOUNTER — Telehealth: Payer: Self-pay | Admitting: Allergy & Immunology

## 2018-04-14 NOTE — Telephone Encounter (Signed)
Patient uses [facility] - mail order Patient needs PA from [facility] for exhance ?? Pharmacy is not sending/refilling medication Can the patient get 90 days supply and refills?? Or will they need to call each month for this?? Please call

## 2018-04-14 NOTE — Telephone Encounter (Signed)
Patient's insurance will no longer cover Xhance. Please advise on alternative.

## 2018-04-15 MED ORDER — FLUTICASONE PROPIONATE 93 MCG/ACT NA EXHU: 2.0000 | INHALANT_SUSPENSION | Freq: Two times a day (BID) | NASAL | 5 refills | Status: DC

## 2018-04-15 NOTE — Telephone Encounter (Signed)
He has done so well on the [place].  If there are any kind of prior authorization or other hoop we can jump through?  [name], MD [facility] and [place] [facility] [place]

## 2018-04-15 NOTE — Telephone Encounter (Signed)
Patient's wife advised me that he just needed a refill. I did try to explain that Medicare would not cover Caleb Evans and wife insisted he is paying out of pocket for med. I have sent a refill in.

## 2018-04-15 NOTE — Telephone Encounter (Signed)
Let us try budesonide rinses twice daily.  If this does not work, we can resort to giving samples. Please give patient the directions below.   Budesonide (Pulmicort) + Saline Irrigation/Rinse   Budesonide (Pulmicort) is an anti-inflammatory steroid medication used to decrease nasal and sinus inflammation. It is dispensed in liquid form in a vial. Although it is manufactured for use with a nebulizer, we intend for you to use it with the NeilMed Sinus Rinse bottle (preferred) or a Neti pot.    Instructions:  1) Make 240cc of saline in the NeilMed bottle using the salt packets or your own saline recipe (see separate handout).  2) Add the entire 2cc vial of liquid Budesonide (Pulmicort) to the rinse bottle and mix together.  3) While in the shower or over the sink, tilt your head forward to a comfortable level. Put the tip of the sinus rinse bottle in your nostril and aim it towards the crown or top of your head. Gently squeeze the bottle to flush out your nose. The fluid will circulate in and out of your sinus cavities, coming back out from either nostril or through your mouth. Try not to swallow large quantities and spit it out instead.  4) Perform Budesonide (Pulmicort) + Saline irrigations 2 times daily.   [name], MD [facility] and [place] [facility] [place]

## 2018-04-15 NOTE — Addendum Note (Signed)
Addended by: Lucrezia Starch I on: 04/15/2018 11:22 AM   Modules accepted: Orders

## 2018-04-15 NOTE — Telephone Encounter (Signed)
We have samples of Caleb Evans is it okay to give sample, No PA will help with Medicare. Please advise

## 2018-04-18 ENCOUNTER — Telehealth: Payer: Self-pay | Admitting: Allergy & Immunology

## 2018-04-18 MED ORDER — FLUTICASONE PROPIONATE 93 MCG/ACT NA EXHU: 2.0000 | INHALANT_SUSPENSION | Freq: Two times a day (BID) | NASAL | 5 refills | Status: DC

## 2018-04-18 NOTE — Telephone Encounter (Signed)
[facility] patient advised

## 2018-04-18 NOTE — Telephone Encounter (Signed)
Pt talk to dr gallagher and he wants to have xhance called into [facility]. He only has to pay $50.00 out of pocket. Self pay

## 2018-04-19 ENCOUNTER — Other Ambulatory Visit: Payer: Self-pay

## 2018-04-19 MED ORDER — FLUTICASONE PROPIONATE 93 MCG/ACT NA EXHU: 2.0000 | INHALANT_SUSPENSION | Freq: Two times a day (BID) | NASAL | 5 refills | Status: DC

## 2018-04-20 ENCOUNTER — Telehealth: Payer: Self-pay | Admitting: Allergy & Immunology

## 2018-04-20 NOTE — Telephone Encounter (Signed)
I just got off the phone with Margarita Mail with [facility] and pt. Would be eligible for free auto fill xhance through their program. Pt. Is medicare but he has a medication retirement plan, which it is not medicare so it is Pharmacist, community. We have to go through Cover My meds and it is step therapy Pt. Was paying [facility] 50.00 per month for xhance but they are no longer filling his xhance.  I will do a cover my meds tomorrow and put down he has tried azelastine and flonase with no relief.  Is pt. Suppose to take 2 sprays bid and what else would you like to add when I do cover my meds?  If approved I will send to [facility] at [place]. Margarita Mail is going to call pt. Tonight to inform pt. Of what we are trying to do to get his xhance for free.

## 2018-04-20 NOTE — Telephone Encounter (Signed)
I called and spoke with [facility] and informed them that I did not receive a PA fax, they are sending a new fax currently, I will work on PA's this afternoon and document accordingly.

## 2018-04-20 NOTE — Telephone Encounter (Signed)
[place] is calling to see if a PA has been done for this patient, for [facility]. It was faxed yesterday, 04-19-18. Department extension is [phone].

## 2018-04-21 ENCOUNTER — Encounter: Payer: Self-pay | Admitting: Pulmonary Disease

## 2018-04-21 ENCOUNTER — Ambulatory Visit (INDEPENDENT_AMBULATORY_CARE_PROVIDER_SITE_OTHER): Payer: Medicare Other | Admitting: Pulmonary Disease

## 2018-04-21 VITALS — BP 124/74 | HR 72 | Ht 67.72 in | Wt 170.6 lb

## 2018-04-21 DIAGNOSIS — J4550 Severe persistent asthma, uncomplicated: Secondary | ICD-10-CM | POA: Diagnosis not present

## 2018-04-21 DIAGNOSIS — R0602 Shortness of breath: Secondary | ICD-10-CM

## 2018-04-21 LAB — NITRIC OXIDE: Nitric Oxide: 43

## 2018-04-21 NOTE — Telephone Encounter (Signed)
Sounds good.  Let me know if I need to do anything.  [name], MD [facility] [place] [facility] [place]

## 2018-04-21 NOTE — Addendum Note (Signed)
Addended by: Darreld Mclean on: 04/21/2018 03:11 PM   Modules accepted: Orders

## 2018-04-21 NOTE — Telephone Encounter (Signed)
Spoke to [place] at [employer] they will send the request to their PA team then fax [place] paper work to fill out to send back to them.

## 2018-04-21 NOTE — Progress Notes (Signed)
Caleb Evans    [id number]    June 04, 1951  Primary Care Physician:Revelo, Elyse Jarvis, MD  Referring Physician: Theotis Burrow, MD [address] [place], [place] [address]  Chief complaint:  Follow up dyspnea, asthma.  HPI: Caleb Evans is a 67 year old with past medical history of asthma. He was diagnosed about 20 years ago. He had been maintained on Advair since 2001. He has history of seasonal allergies, rhinitis, postnasal drip. His symptoms are usually worse in spring and fall. He had been using Flonase and Zyrtec for his allergies. He also has reflux, heartburn symptoms and is on Nexium for this.   He has had a rough year after moving to [place] from [place]. His symptoms have been exacerbated from summer to fall with the pollen and required multiple rounds of prednisone. He feels about 70% better but still has some dyspnea with wheezing. He uses his albuterol about once a day and continues on Symbicort and Spiriva. He used to work in the Research [employer], trade union at [employer] was exposed to significant dust, asbestos, chemicals at the [employer] in September 2011.  Interim history: Caleb Evans is lost to follow-up since 2017.  At that point the plan was to initiate nucala however he is canceled his follow-up appointment Seen in clinic today with ongoing symptoms of dyspnea which have worsened over the past 3 months He is using the Symbicort and albuterol every day.  Daily nighttime awakenings He is followed up with Dr. Ernst Bowler for allergy and immunology and is on immunotherapy for allergies.  Physical Exam: Blood pressure 124/74, pulse 72, height 5' 7.72" (1.72 m), weight 170 lb 9.6 oz (77.4 kg), SpO2 97 %. Gen:      No acute distress HEENT:  EOMI, sclera anicteric Neck:     No masses; no thyromegaly Lungs:    Clear to auscultation bilaterally; normal respiratory effort CV:         Regular rate and rhythm; no murmurs Abd:      + bowel  sounds; soft, non-tender; no palpable masses, no distension Ext:    No edema; adequate peripheral perfusion Skin:      Warm and dry; no rash Neuro: alert and oriented x 3 Psych: normal mood and affect  Data Reviewed: Imaging CXR 03/25/2016 Abnormal markings at the lung bases with slight volume loss. This could represent chronic scarring/ fibrosis or mild bibasilar pneumonia.  CT scan 04/22/16 No evidence of interstitial lung disease, mild bronchiectasis bases, bronchial wall thickening, mild emphysema. Images reviewed.  Spirometry 04/08/16 FVC 2.68 [6%) FEV1 2.03 [72%), post bronchodilator 2.30 [82%], + 13% F/F 75 Minimal obstruction, positive bronchial dilator response  Allergy testing: Positive for trees, weeds, mold, dust mite.  FENO 04/09/16- 80 FENO 04/21/2018-43  Labs 04/29/16 Aspergillus ab - negative IgE- 81 CBC with diff- WBC count 12.3, absolute eosinophil count 1242/microL  Assessment:  Severe persistent asthma Allergic rhinitis On Symbicort Still continues to be symptomatic. He has markedly elevated peripheral eosinophil count. He'll be a good candidate for anti-IL-5 therapy. I'll start the paperwork for any Nucala  Continues with his immunotherapy for severe allergies  Bronchiectasis Continue flutter valve for clearance of secretions. There is no evidence of ABPA as IgE levels and aspergillus antibodies are negative  COPD Emphysema seen on recent imaging of the chest. He has mild obstruction on spirometry Get full PFTs  Plan/Recommendations: - Continue Symbicort.  Advised that he needs to take this  every day without fail - Start paperwork for nucala  Outpatient Encounter Medications as of 04/21/2018  Medication Sig  . atorvastatin (LIPITOR) 80 MG tablet Take 80 mg by mouth at bedtime.  . Azelastine HCl 137 MCG/SPRAY SOLN PLACE 2 SPRAYS IN BOTH NOSTRILS 2 TIMES DAILY.  . Calcium Carb-Cholecalciferol (CALCIUM 600+D3 PO) Take 1 tablet by mouth daily.  .  fluticasone (FLONASE) 50 MCG/ACT nasal spray Place 2 sprays into both nostrils daily.  . Fluticasone Propionate (XHANCE) 93 MCG/ACT EXHU Place 2 puffs into the nose 2 (two) times daily.  . mometasone-formoterol (DULERA) 100-5 MCG/ACT AERO Inhale 2 puffs into the lungs 2 (two) times daily.  . Multiple Vitamin (MULTIVITAMIN WITH MINERALS) TABS tablet Take 1 tablet by mouth daily.  . NON FORMULARY 1 Dose by Subconjunctival route every Tuesday. ALLERGY SHOTS  . Olopatadine HCl (PATADAY) 0.2 % SOLN Place 1 drop into both eyes daily as needed (for irritated/allergy eyes.).  Marland Kitchen PROAIR HFA 108 (90 Base) MCG/ACT inhaler INHALE 2 PUFFS EVERY 4 TO 6 HOURS AS NEEDED.  . benzonatate (TESSALON) 100 MG capsule Take by mouth 2 (two) times daily as needed for cough.  . [DISCONTINUED] doxycycline (VIBRAMYCIN) 100 MG capsule Take 1 capsule (100 mg total) by mouth 2 (two) times daily. One po bid x 7 days (Patient not taking: Reported on 04/21/2018)  . [DISCONTINUED] oxyCODONE (OXY IR/ROXICODONE) 5 MG immediate release tablet Take 1-2 tablets (5-10 mg total) by mouth every 4 (four) hours as needed for moderate pain. (Patient not taking: Reported on 04/21/2018)  . [DISCONTINUED] predniSONE (DELTASONE) 20 MG tablet 2 tabs po daily x 4 days (Patient not taking: Reported on 04/21/2018)  . [DISCONTINUED] varenicline (CHANTIX STARTING MONTH PAK) 0.5 MG X 11 & 1 MG X 42 tablet Take one 0.5 mg tablet by mouth once daily for 3 days, then increase to one 0.5 mg tablet twice daily for 4 days, then increase to one 1 mg tablet twice daily. (Patient not taking: Reported on 04/21/2018)   Facility-Administered Encounter Medications as of 04/21/2018  Medication  . clindamycin (CLEOCIN) 900 mg in dextrose 5 % 50 mL IVPB   And  . gentamicin (GARAMYCIN) 390 mg in dextrose 5 % 50 mL IVPB    Allergies as of 04/21/2018 - Review Complete 04/21/2018  Allergen Reaction Noted  . Chlorpheniramine Other (See Comments) 12/25/2015  . Amoxicillin-pot  clavulanate Other (See Comments) 03/25/2016    Past Medical History:  Diagnosis Date  . AAA (abdominal aortic aneurysm) ([place])   . Adrenal nodule ([place])   . Asthma   . Bladder mass   . Bladder neoplasm   . Cancer [facility])    bladder cancer  . COPD (chronic obstructive pulmonary disease) ([place])   . Crohn's disease ([place])   . Diverticulitis   . GERD (gastroesophageal reflux disease)    takes Nexium off and on  . Over weight   . Skin lesion   . Thoracic aortic aneurysm ([place])   . Weak urinary stream     Past Surgical History:  Procedure Laterality Date  . APPENDECTOMY    . CHOLECYSTECTOMY    . LAPAROSCOPIC PARTIAL COLECTOMY  01/18/2018   ERAS PATHWAY  . LAPAROSCOPIC PARTIAL COLECTOMY N/A 01/18/2018   Procedure: LAPAROSCOPIC PARTIAL COLECTOMY ERAS PATHWAY;  Surgeon: Stark Klein, MD;  Location: [facility];  Service: General;  Laterality: N/A;  . TONSILLECTOMY      Family History  Adopted: Yes  Problem Relation Age of Onset  . Bladder Cancer Neg Hx   .  Kidney cancer Neg Hx   . Prostate cancer Neg Hx   . Allergic rhinitis Neg Hx   . Angioedema Neg Hx   . Asthma Neg Hx   . Eczema Neg Hx   . Urticaria Neg Hx   . Immunodeficiency Neg Hx     Social History   Socioeconomic History  . Marital status: Married    Spouse name: Not on file  . Number of children: Not on file  . Years of education: Not on file  . Highest education level: Not on file  Occupational History  . Not on file  Social Needs  . Financial resource strain: Not on file  . Food insecurity:    Worry: Not on file    Inability: Not on file  . Transportation needs:    Medical: Not on file    Non-medical: Not on file  Tobacco Use  . Smoking status: Former Smoker    Packs/day: 3.00    Years: 50.00    Pack years: 150.00    Types: Cigarettes    Last attempt to quit: 04/10/2018    Years since quitting: 0.0  . Smokeless tobacco: Never Used  . Tobacco comment: quit two weeks ago  Substance and Sexual Activity  .  Alcohol use: Yes    Alcohol/week: 0.0 standard drinks    Comment: occasional  . Drug use: No  . Sexual activity: Not on file  Lifestyle  . Physical activity:    Days per week: Not on file    Minutes per session: Not on file  . Stress: Not on file  Relationships  . Social connections:    Talks on phone: Not on file    Gets together: Not on file    Attends religious service: Not on file    Active member of club or organization: Not on file    Attends meetings of clubs or organizations: Not on file    Relationship status: Not on file  . Intimate partner violence:    Fear of current or ex partner: Not on file    Emotionally abused: Not on file    Physically abused: Not on file    Forced sexual activity: Not on file  Other Topics Concern  . Not on file  Social History Narrative   Patient is adopted   Married, lives with spouse   2 children (boy and girl)   OCCUPATION: Dealer x78yr.  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  HENT: Negative.   Eyes: Negative for blurred vision.  Respiratory: as per HPI  Cardiovascular: Negative for chest pain and palpitations.  Gastrointestinal: Negative for vomiting, diarrhea, blood per rectum. Genitourinary: Negative for dysuria, urgency, frequency and hematuria.  Musculoskeletal: Negative for myalgias, back pain and joint pain.  Skin: Negative for itching and rash.  Neurological: Negative for dizziness, tremors, focal weakness, seizures and loss of consciousness.  Endo/Heme/Allergies: Negative for environmental allergies.  Psychiatric/Behavioral: Negative for depression, suicidal ideas and hallucinations.  All other systems reviewed and are negative.  PMarshell GarfinkelMD [facility] Pager [phone]/07/2018, 2:34 PM  CC: RTheotis Burrow

## 2018-04-21 NOTE — Patient Instructions (Addendum)
Continue using the Symbicort We will schedule you for pulmonary function test Will start paperwork for Nucala injection.

## 2018-04-21 NOTE — Telephone Encounter (Signed)
Thank you for jumping through these hoops. I gave him a sample on Tuesday, so that should cover for a week. We can give him another if needed on Tuesday.  He told me on Tuesday that Laurie was giving him a hard time, but if that is going to be free, let's do that again.   What a hassle with Medicare!   Caleb Marvel, MD Allergy and [place] [facility] [place]

## 2018-04-22 NOTE — Telephone Encounter (Signed)
Patient has [place]bs [place]. We will need to do a formulary exception.

## 2018-04-25 ENCOUNTER — Telehealth: Payer: Self-pay | Admitting: Pulmonary Disease

## 2018-04-25 NOTE — Telephone Encounter (Signed)
I will bring in on Wednesday for you to sign off and complete.

## 2018-04-25 NOTE — Telephone Encounter (Signed)
Form completed. Will need Dr. Gillermina Hu signature and rationale for an exception.

## 2018-04-25 NOTE — Telephone Encounter (Signed)
Spoke with patient and he is updated and aware that we will have another sample for him in [place] tomorrow.

## 2018-04-26 ENCOUNTER — Ambulatory Visit (INDEPENDENT_AMBULATORY_CARE_PROVIDER_SITE_OTHER): Payer: Medicare Other | Admitting: *Deleted

## 2018-04-26 DIAGNOSIS — J309 Allergic rhinitis, unspecified: Secondary | ICD-10-CM | POA: Diagnosis not present

## 2018-04-26 NOTE — Telephone Encounter (Signed)
Awesome! Doesn't Medicare ROCK?!   [name], MD Allergy and [place] [facility]

## 2018-04-27 ENCOUNTER — Ambulatory Visit: Payer: Medicare Other | Admitting: Pulmonary Disease

## 2018-04-27 DIAGNOSIS — R0602 Shortness of breath: Secondary | ICD-10-CM

## 2018-04-27 DIAGNOSIS — J4550 Severe persistent asthma, uncomplicated: Secondary | ICD-10-CM

## 2018-04-27 LAB — PULMONARY FUNCTION TEST
DL/VA % PRED: 84 %
DL/VA: 3.71 ml/min/mmHg/L
DLCO UNC % PRED: 70 %
DLCO UNC: 20.09 ml/min/mmHg
FEF 25-75 POST: 1.95 L/s
FEF 25-75 PRE: 1.33 L/s
FEF2575-%Change-Post: 46 %
FEF2575-%PRED-POST: 80 %
FEF2575-%PRED-PRE: 54 %
FEV1-%Change-Post: 7 %
FEV1-%Pred-Post: 81 %
FEV1-%Pred-Pre: 75 %
FEV1-POST: 2.5 L
FEV1-Pre: 2.32 L
FEV1FVC-%Change-Post: 3 %
FEV1FVC-%PRED-PRE: 92 %
FEV6-%CHANGE-POST: 5 %
FEV6-%PRED-POST: 89 %
FEV6-%Pred-Pre: 84 %
FEV6-Post: 3.47 L
FEV6-Pre: 3.29 L
FEV6FVC-%Change-Post: 0 %
FEV6FVC-%Pred-Post: 105 %
FEV6FVC-%Pred-Pre: 104 %
FVC-%Change-Post: 4 %
FVC-%PRED-POST: 84 %
FVC-%Pred-Pre: 81 %
FVC-Post: 3.47 L
FVC-Pre: 3.32 L
POST FEV1/FVC RATIO: 72 %
Post FEV6/FVC ratio: 100 %
Pre FEV1/FVC ratio: 70 %
Pre FEV6/FVC Ratio: 99 %
RV % pred: 107 %
RV: 2.35 L
TLC % pred: 90 %
TLC: 5.8 L

## 2018-04-27 NOTE — Progress Notes (Signed)
PFT done today. 

## 2018-04-27 NOTE — Telephone Encounter (Signed)
Caleb Evans from [place] is calling for the enrollment  form [phone]

## 2018-04-27 NOTE — Telephone Encounter (Signed)
I returned [employer]'s call. It was about the fact that I checked for them to send [place] pt's Nucala in a syringe instead of a vial. Another pt's ins.co.and pharmacy sent pfs and sent it to our office. I was hoping they would do the same. No, it has to sdv if given in office. Will leave encounter open in the event we need a P/A.

## 2018-05-02 NOTE — Telephone Encounter (Signed)
I received pt's summary of benefits. I need to speak to Katie a/b this one. She hasn't had a chance to call or come over here. Pt has met his dedctible, OOP: N/A Co-pay is 20% and we have to buy & bill. They didn't give a spec. Pharm. Is N/A. Will route to [facility] for help.

## 2018-05-03 NOTE — Telephone Encounter (Signed)
Pt will be buy and bill but will need to understand his copay/coinsurance amount is 20% of cost of medication. He will most likely need to seek funding from [employer] or other groups.

## 2018-05-06 NOTE — Telephone Encounter (Signed)
Called pt [email or web address]. I haven't heard form him so I'll call him again.

## 2018-05-10 ENCOUNTER — Ambulatory Visit (INDEPENDENT_AMBULATORY_CARE_PROVIDER_SITE_OTHER): Payer: Medicare Other | Admitting: *Deleted

## 2018-05-10 ENCOUNTER — Telehealth: Payer: Self-pay | Admitting: Pulmonary Disease

## 2018-05-10 DIAGNOSIS — J309 Allergic rhinitis, unspecified: Secondary | ICD-10-CM | POA: Diagnosis not present

## 2018-05-10 NOTE — Telephone Encounter (Signed)
Spoke with pt's wife and advised her that I would get Dr. Vaughan Browner to review the report. Dr. Vaughan Browner please advise.

## 2018-05-10 NOTE — Telephone Encounter (Signed)
Please let patient know PFTs show moderate obstruction which is due to a combination of COPD and asthma. Continue current inhalers. We can add spiriva to symbicort to see if it helps with breathing.  Check what the status of the nucala is?  Marshell Garfinkel MD [facility] 05/10/2018, 5:26 PM

## 2018-05-10 NOTE — Telephone Encounter (Signed)
I received a call from [employer], they have not been able to get in touch with pt.. I spoke with pt, he said he e-mailed [employer] and they told him he was not eliglible. I explained to the pt they just call me needing the same info. Pt took down the # and said he would call [employer].. Will wait to hear from [employer] or pt.Marland Kitchen

## 2018-05-11 NOTE — Telephone Encounter (Signed)
Butch Penny from [employer] at [place] called states that application they received is missing information, they still need proof of income, and rx spend down, before application is reviewed - She can be reached at [phone]

## 2018-05-11 NOTE — Telephone Encounter (Signed)
Will route to [employer] for f/u.

## 2018-05-11 NOTE — Telephone Encounter (Signed)
Attempted to call pt. I did not receive an answer. I have left a message for pt to return our call.  

## 2018-05-11 NOTE — Telephone Encounter (Signed)
Called and spoke to patient, per patient all information that was needed was given, Nucala rep told them they met their deductible and everything is covered. Will route information to Alroy Bailiff at [facility] to advise patient of next steps.

## 2018-05-12 NOTE — Telephone Encounter (Signed)
Spoke with pt and let him know I talked to [employer]. He has a 2nd insurance that will pay what medicare doesn't pay. We can [employer]. Pt. Wants me to call his secodary ins. I called them and couldn't get past the automated service. Will try again 05/13/18.

## 2018-05-12 NOTE — Telephone Encounter (Signed)
Spoke with pt's wife, Manuela Schwartz. She is aware of the pt's PFT results. States that she will speak to the pt about adding in another inhaler and get back with [place].  Margie - do you know the status of the pt's Nucala paperwork? Thanks.

## 2018-05-12 NOTE — Telephone Encounter (Signed)
Please refer to ph note 05/09/18 for updates. Nothing further needed.

## 2018-05-12 NOTE — Telephone Encounter (Signed)
Patient is calling back.  States he does not know about an inhaler needing to be added.  He is asking about the Nucala.  He states he will have to pay 20% and he needs to know what the cost is. CB is [phone]

## 2018-05-12 NOTE — Telephone Encounter (Signed)
Spoke with pt, he states he would like to know what his co-pay would be for Nucala? TS could you help pt with this?

## 2018-05-12 NOTE — Telephone Encounter (Signed)
Will route to [employer] for update on Nucala.

## 2018-05-12 NOTE — Telephone Encounter (Signed)
I have resubmitted prior authorization on covermymeds website. The patient had an additional PBM plan ([employer]). I should have response in 1-5 business days.

## 2018-05-16 NOTE — Telephone Encounter (Signed)
Patient's [place]bs [place] plan has denied coverage of the [place]. I am reaching out  To our [employer](Xhance) representative to see what further assistance she can offer.

## 2018-05-16 NOTE — Telephone Encounter (Signed)
I have sent with this information attached. Thank you.

## 2018-05-16 NOTE — Telephone Encounter (Signed)
Appeal to be submitted. Please provide specific documentation for coverage of Xhance. Thank you.

## 2018-05-16 NOTE — Telephone Encounter (Signed)
Please insert the following blurb into a letter. I will be happy to sign it. Thanks!   Caleb Evans has a history of perennial and seasonal allergic rhinitis as well as moderate persistent asthma. I have followed him for nearly two years. His rhinitis symptoms drive his asthma symptoms, and he has been well controlled on the Xhance in conjunction with allergy shots for the past. He has been on a multitude of other nasal sprays in the past, including Flonase and Nasacort as well as azelastine nasal spray, all without improvement. Fortunately, the Truett Perna has helped tremendously in controlling his symptoms, including the postnasal drip which triggers his asthma. This helps to decrease his ED visits as well as his exposure to prednisone.   [name], MD Allergy and [place] [facility] [place]

## 2018-05-17 ENCOUNTER — Ambulatory Visit (INDEPENDENT_AMBULATORY_CARE_PROVIDER_SITE_OTHER): Payer: Medicare Other

## 2018-05-17 DIAGNOSIS — J309 Allergic rhinitis, unspecified: Secondary | ICD-10-CM | POA: Diagnosis not present

## 2018-05-17 NOTE — Telephone Encounter (Signed)
Prior authorization has been faxed along with progress notes and letter from Dr. Ernst Bowler. Placed in pending file box in [facility] clinical staff station until coverage determination receive.

## 2018-05-17 NOTE — Telephone Encounter (Signed)
I spoke with patient this morning and he advised that his coverage for part d is with [employer]. ID is [id number] RxBIN [id number] Issuer [phone]. Phone number is [phone]. I contacted that number and they are faxing over the correct form to be completed and signed by MD. I will include the printed letter as mentioned previously by Dr. Ernst Bowler.

## 2018-05-19 ENCOUNTER — Other Ambulatory Visit: Payer: Self-pay | Admitting: Family Medicine

## 2018-05-19 DIAGNOSIS — Z13820 Encounter for screening for osteoporosis: Secondary | ICD-10-CM

## 2018-05-19 NOTE — Telephone Encounter (Signed)
Pt is aware that Medicare will cover Nucala at 80% and [place] will pick up additional 20% as the Medicare approved medication.   I have ordered Nucala for patient-will wait for medication to come in and then schedule patient for 1st injection-pt aware we will go over protocol for Biologic's and Epipen rx at that time.   1 Vial Order Date: 05/19/18 (ordered at 3:00pm EST) Shipping Date: 05/23/18

## 2018-05-20 ENCOUNTER — Telehealth: Payer: Self-pay | Admitting: Pulmonary Disease

## 2018-05-20 NOTE — Telephone Encounter (Addendum)
Nucala received today.  1 vial Arrival Date:10.11.19 Lot #: [id number] Exp Date:11/2021   See phone note dated 05-20-18 for start date information.

## 2018-05-20 NOTE — Telephone Encounter (Signed)
I called [employer] to [employer] they were referring to was for patient assistance/free medication. Pt does not qualify for this program. He is covered 100% with both insurances. Medication has come in today as well.   Pt is aware of Nucala is here in office and ready to start. First Injection appt has been made for Monday 05/23/18 at 1:30pm. Aware of 2 hour wait and patient has current epipen as he takes allergy injections as well.

## 2018-05-23 ENCOUNTER — Other Ambulatory Visit: Payer: Self-pay | Admitting: Pulmonary Disease

## 2018-05-23 ENCOUNTER — Ambulatory Visit (INDEPENDENT_AMBULATORY_CARE_PROVIDER_SITE_OTHER): Payer: Medicare Other

## 2018-05-23 DIAGNOSIS — J4540 Moderate persistent asthma, uncomplicated: Secondary | ICD-10-CM

## 2018-05-23 MED ORDER — EPINEPHRINE 0.3 MG/0.3ML IJ SOAJ: 0.3000 mg | Freq: Once | INTRAMUSCULAR | 11 refills | Status: AC

## 2018-05-23 MED ORDER — MEPOLIZUMAB 100 MG ~~LOC~~ SOLR
100.0000 mg | Freq: Once | SUBCUTANEOUS | Status: AC
  Administered 2018-05-23: 100 mg via SUBCUTANEOUS

## 2018-05-24 ENCOUNTER — Telehealth: Payer: Self-pay | Admitting: Pulmonary Disease

## 2018-05-24 MED ORDER — EPINEPHRINE 0.3 MG/0.3ML IJ SOAJ: 0.3000 mg | Freq: Once | INTRAMUSCULAR | 11 refills | Status: AC

## 2018-05-24 NOTE — Telephone Encounter (Signed)
Rx sent 

## 2018-05-25 ENCOUNTER — Ambulatory Visit (INDEPENDENT_AMBULATORY_CARE_PROVIDER_SITE_OTHER): Payer: Medicare Other | Admitting: *Deleted

## 2018-05-25 DIAGNOSIS — J309 Allergic rhinitis, unspecified: Secondary | ICD-10-CM | POA: Diagnosis not present

## 2018-05-25 MED ORDER — AUVI-Q 0.3 MG/0.3ML IJ SOAJ: 0.3000 mg | Freq: Once | INTRAMUSCULAR | 1 refills | Status: AC

## 2018-05-25 NOTE — Addendum Note (Signed)
Addended by: Lucrezia Starch I on: 05/25/2018 02:20 PM   Modules accepted: Orders

## 2018-05-25 NOTE — Telephone Encounter (Signed)
Final decision was a denial per patient's insurance. Patient is aware and he only has a 50 dollar deductible as of right now. He will continue to pay the copay and will let me know if he needs samples. He would also like a refill on his Auvi-Q. I am sending that in now.

## 2018-06-01 ENCOUNTER — Ambulatory Visit (INDEPENDENT_AMBULATORY_CARE_PROVIDER_SITE_OTHER): Payer: Medicare Other

## 2018-06-01 DIAGNOSIS — J309 Allergic rhinitis, unspecified: Secondary | ICD-10-CM

## 2018-06-08 ENCOUNTER — Ambulatory Visit (INDEPENDENT_AMBULATORY_CARE_PROVIDER_SITE_OTHER): Payer: Medicare Other | Admitting: *Deleted

## 2018-06-08 DIAGNOSIS — J309 Allergic rhinitis, unspecified: Secondary | ICD-10-CM | POA: Diagnosis not present

## 2018-06-10 ENCOUNTER — Other Ambulatory Visit: Payer: PRIVATE HEALTH INSURANCE | Admitting: Urology

## 2018-06-20 ENCOUNTER — Ambulatory Visit (INDEPENDENT_AMBULATORY_CARE_PROVIDER_SITE_OTHER): Payer: Medicare Other

## 2018-06-20 ENCOUNTER — Telehealth: Payer: Self-pay | Admitting: Pulmonary Disease

## 2018-06-20 DIAGNOSIS — J4540 Moderate persistent asthma, uncomplicated: Secondary | ICD-10-CM | POA: Diagnosis not present

## 2018-06-20 MED ORDER — DUPILUMAB 300 MG/2ML ~~LOC~~ SOSY
300.0000 mg | PREFILLED_SYRINGE | Freq: Once | SUBCUTANEOUS | Status: AC
  Administered 2018-06-20: 300 mg via SUBCUTANEOUS

## 2018-06-20 NOTE — Progress Notes (Addendum)
Documentation of medication administration and charges of Nucala have been completed by Desmond Dike, CMA based on the hand written Nucala documentation sheet completed by Alroy Bailiff, who administered the medication.

## 2018-06-20 NOTE — Telephone Encounter (Signed)
1 vial Arrival Date:06/20/18 Lot #: [id number] Exp Date:11/2021

## 2018-06-21 DIAGNOSIS — J4540 Moderate persistent asthma, uncomplicated: Secondary | ICD-10-CM | POA: Diagnosis not present

## 2018-06-21 MED ORDER — MEPOLIZUMAB 100 MG ~~LOC~~ SOLR
100.0000 mg | SUBCUTANEOUS | Status: DC
  Administered 2018-06-21: 100 mg via SUBCUTANEOUS

## 2018-06-21 NOTE — Addendum Note (Signed)
Addended by: Desmond Dike C on: 06/21/2018 08:10 AM   Modules accepted: Orders

## 2018-06-29 ENCOUNTER — Ambulatory Visit (INDEPENDENT_AMBULATORY_CARE_PROVIDER_SITE_OTHER): Payer: Medicare Other | Admitting: *Deleted

## 2018-06-29 DIAGNOSIS — J309 Allergic rhinitis, unspecified: Secondary | ICD-10-CM | POA: Diagnosis not present

## 2018-06-30 ENCOUNTER — Other Ambulatory Visit: Payer: PRIVATE HEALTH INSURANCE | Admitting: Urology

## 2018-07-13 ENCOUNTER — Ambulatory Visit (INDEPENDENT_AMBULATORY_CARE_PROVIDER_SITE_OTHER): Payer: Medicare Other

## 2018-07-13 DIAGNOSIS — J309 Allergic rhinitis, unspecified: Secondary | ICD-10-CM

## 2018-07-19 ENCOUNTER — Telehealth: Payer: Self-pay | Admitting: Pulmonary Disease

## 2018-07-20 NOTE — Telephone Encounter (Signed)
1 vial Arrival Date:07/20/18 Lot #: [id number] BTVD:04/[id number]

## 2018-07-20 NOTE — Telephone Encounter (Signed)
1 Vial Order Date: 07/19/18 Shipping Date: 07/19/18

## 2018-07-21 ENCOUNTER — Ambulatory Visit (INDEPENDENT_AMBULATORY_CARE_PROVIDER_SITE_OTHER): Payer: Medicare Other

## 2018-07-21 DIAGNOSIS — J4540 Moderate persistent asthma, uncomplicated: Secondary | ICD-10-CM | POA: Diagnosis not present

## 2018-07-21 MED ORDER — MEPOLIZUMAB 100 MG ~~LOC~~ SOLR
100.0000 mg | Freq: Once | SUBCUTANEOUS | Status: AC
  Administered 2018-07-21: 100 mg via SUBCUTANEOUS

## 2018-07-27 ENCOUNTER — Ambulatory Visit (INDEPENDENT_AMBULATORY_CARE_PROVIDER_SITE_OTHER): Payer: Medicare Other | Admitting: *Deleted

## 2018-07-27 DIAGNOSIS — J309 Allergic rhinitis, unspecified: Secondary | ICD-10-CM

## 2018-08-11 ENCOUNTER — Other Ambulatory Visit: Payer: Self-pay | Admitting: Allergy & Immunology

## 2018-08-12 ENCOUNTER — Ambulatory Visit (INDEPENDENT_AMBULATORY_CARE_PROVIDER_SITE_OTHER): Payer: Medicare Other

## 2018-08-12 DIAGNOSIS — J309 Allergic rhinitis, unspecified: Secondary | ICD-10-CM

## 2018-08-17 ENCOUNTER — Telehealth: Payer: Self-pay | Admitting: Pulmonary Disease

## 2018-08-17 NOTE — Telephone Encounter (Signed)
1 Vial Order Date: 08/17/2018 Shipping Date: 08/17/2018

## 2018-08-18 NOTE — Telephone Encounter (Signed)
1 vial Arrival Date:08/18/2018 Lot #: [id number] QVOH:09/917

## 2018-08-23 ENCOUNTER — Ambulatory Visit (INDEPENDENT_AMBULATORY_CARE_PROVIDER_SITE_OTHER): Payer: Medicare Other

## 2018-08-23 DIAGNOSIS — J4540 Moderate persistent asthma, uncomplicated: Secondary | ICD-10-CM

## 2018-08-24 ENCOUNTER — Ambulatory Visit (INDEPENDENT_AMBULATORY_CARE_PROVIDER_SITE_OTHER): Payer: Medicare Other

## 2018-08-24 DIAGNOSIS — J309 Allergic rhinitis, unspecified: Secondary | ICD-10-CM | POA: Diagnosis not present

## 2018-08-25 MED ORDER — MEPOLIZUMAB 100 MG ~~LOC~~ SOLR
100.0000 mg | Freq: Once | SUBCUTANEOUS | Status: AC
  Administered 2018-08-23: 100 mg via SUBCUTANEOUS

## 2018-09-07 DIAGNOSIS — J3089 Other allergic rhinitis: Secondary | ICD-10-CM | POA: Diagnosis not present

## 2018-09-07 NOTE — Progress Notes (Signed)
VIAL EXP 09-08-2019

## 2018-09-09 ENCOUNTER — Ambulatory Visit (INDEPENDENT_AMBULATORY_CARE_PROVIDER_SITE_OTHER): Payer: Medicare Other

## 2018-09-09 DIAGNOSIS — J309 Allergic rhinitis, unspecified: Secondary | ICD-10-CM

## 2018-09-21 ENCOUNTER — Ambulatory Visit (INDEPENDENT_AMBULATORY_CARE_PROVIDER_SITE_OTHER): Payer: Medicare Other

## 2018-09-21 DIAGNOSIS — J309 Allergic rhinitis, unspecified: Secondary | ICD-10-CM | POA: Diagnosis not present

## 2018-09-22 ENCOUNTER — Telehealth: Payer: Self-pay | Admitting: Pulmonary Disease

## 2018-09-22 NOTE — Telephone Encounter (Signed)
1 vial Arrival Date:09/22/2018 Lot #: [id number] KAJG:03/1156

## 2018-09-23 ENCOUNTER — Ambulatory Visit (INDEPENDENT_AMBULATORY_CARE_PROVIDER_SITE_OTHER): Payer: Medicare Other

## 2018-09-23 DIAGNOSIS — J4540 Moderate persistent asthma, uncomplicated: Secondary | ICD-10-CM

## 2018-09-27 MED ORDER — MEPOLIZUMAB 100 MG ~~LOC~~ SOLR
100.0000 mg | SUBCUTANEOUS | Status: DC
  Administered 2018-09-23: 100 mg via SUBCUTANEOUS

## 2018-09-27 NOTE — Progress Notes (Signed)
nucala injection documentation and charges entered by Maury Dus, RMA, based on injection sheet filled out by [facility] during preparation and administration. This documentation process is due to office requirements.

## 2018-09-28 ENCOUNTER — Ambulatory Visit (INDEPENDENT_AMBULATORY_CARE_PROVIDER_SITE_OTHER): Payer: Medicare Other

## 2018-09-28 DIAGNOSIS — J309 Allergic rhinitis, unspecified: Secondary | ICD-10-CM

## 2018-10-05 ENCOUNTER — Ambulatory Visit (INDEPENDENT_AMBULATORY_CARE_PROVIDER_SITE_OTHER): Payer: Medicare Other | Admitting: *Deleted

## 2018-10-05 DIAGNOSIS — J309 Allergic rhinitis, unspecified: Secondary | ICD-10-CM

## 2018-10-12 ENCOUNTER — Ambulatory Visit (INDEPENDENT_AMBULATORY_CARE_PROVIDER_SITE_OTHER): Payer: Medicare Other

## 2018-10-12 DIAGNOSIS — J309 Allergic rhinitis, unspecified: Secondary | ICD-10-CM | POA: Diagnosis not present

## 2018-10-13 ENCOUNTER — Encounter: Payer: Self-pay | Admitting: *Deleted

## 2018-10-19 ENCOUNTER — Ambulatory Visit (INDEPENDENT_AMBULATORY_CARE_PROVIDER_SITE_OTHER): Payer: Medicare Other

## 2018-10-19 DIAGNOSIS — J309 Allergic rhinitis, unspecified: Secondary | ICD-10-CM

## 2018-10-20 ENCOUNTER — Other Ambulatory Visit: Payer: Self-pay

## 2018-10-20 ENCOUNTER — Encounter: Payer: Self-pay | Admitting: Urology

## 2018-10-20 ENCOUNTER — Ambulatory Visit (INDEPENDENT_AMBULATORY_CARE_PROVIDER_SITE_OTHER): Payer: Medicare Other | Admitting: Urology

## 2018-10-20 VITALS — BP 163/101 | HR 72 | Ht 67.2 in | Wt 183.0 lb

## 2018-10-20 DIAGNOSIS — Z126 Encounter for screening for malignant neoplasm of bladder: Secondary | ICD-10-CM

## 2018-10-20 NOTE — Progress Notes (Signed)
Cystoscopy Procedure Note:  Indication:  Hx of LG Ta TURBT w/Dr. Elnoria Howard 08/2014, 1cm LG Ta urothelial cell carcinoma No hx of recurrence Quit smoking this year  After informed consent and discussion of the procedure and its risks, Caleb Evans was positioned and prepped in the standard fashion. Cystoscopy was performed with a flexible cystoscope. The urethra, bladder neck and entire bladder was visualized in a standard fashion. The prostate was moderate in size. The ureteral orifices were visualized in their normal location and orientation. Bladder mucosa grossly normal throughout, no abnormalities on retroflexion.  Findings: Normal cystoscopy  Assessment and Plan: RTC for cysto in one year, consider q2 years or discontinuing at that point (5 years out)  Caleb Madrid, MD 10/20/2018

## 2018-10-24 ENCOUNTER — Ambulatory Visit: Payer: BLUE CROSS/BLUE SHIELD

## 2018-10-25 ENCOUNTER — Other Ambulatory Visit: Payer: Self-pay

## 2018-10-25 ENCOUNTER — Ambulatory Visit (INDEPENDENT_AMBULATORY_CARE_PROVIDER_SITE_OTHER): Payer: Medicare Other

## 2018-10-25 ENCOUNTER — Telehealth: Payer: Self-pay | Admitting: *Deleted

## 2018-10-25 DIAGNOSIS — J4550 Severe persistent asthma, uncomplicated: Secondary | ICD-10-CM | POA: Diagnosis not present

## 2018-10-25 MED ORDER — MEPOLIZUMAB 100 MG ~~LOC~~ SOLR
100.0000 mg | Freq: Once | SUBCUTANEOUS | Status: AC
  Administered 2018-10-25: 100 mg via SUBCUTANEOUS

## 2018-10-25 NOTE — Telephone Encounter (Signed)
1 vial Arrival Date:10/25/2018 Lot #: [id number] Exp Date:04/2022  I ran out of time 10/24/2018, didn't get order in [place] text.

## 2018-10-27 ENCOUNTER — Encounter: Payer: Self-pay | Admitting: *Deleted

## 2018-11-23 ENCOUNTER — Ambulatory Visit (INDEPENDENT_AMBULATORY_CARE_PROVIDER_SITE_OTHER): Payer: Medicare Other | Admitting: *Deleted

## 2018-11-23 DIAGNOSIS — J309 Allergic rhinitis, unspecified: Secondary | ICD-10-CM | POA: Diagnosis not present

## 2018-11-24 ENCOUNTER — Telehealth: Payer: Self-pay | Admitting: Pulmonary Disease

## 2018-11-24 ENCOUNTER — Encounter (INDEPENDENT_AMBULATORY_CARE_PROVIDER_SITE_OTHER): Payer: PRIVATE HEALTH INSURANCE

## 2018-11-24 ENCOUNTER — Ambulatory Visit (INDEPENDENT_AMBULATORY_CARE_PROVIDER_SITE_OTHER): Payer: PRIVATE HEALTH INSURANCE | Admitting: Vascular Surgery

## 2018-11-24 NOTE — Telephone Encounter (Signed)
Nucala Order: 111m #1 Vial Order Date: 11/24/2018 Expected shipping Date: 11/25/2018 Ordered by: LDesmond Dike [place]

## 2018-11-25 ENCOUNTER — Other Ambulatory Visit: Payer: Self-pay

## 2018-11-25 ENCOUNTER — Ambulatory Visit (INDEPENDENT_AMBULATORY_CARE_PROVIDER_SITE_OTHER): Payer: Medicare Other

## 2018-11-25 ENCOUNTER — Telehealth: Payer: Self-pay

## 2018-11-25 DIAGNOSIS — J4550 Severe persistent asthma, uncomplicated: Secondary | ICD-10-CM

## 2018-11-25 MED ORDER — MEPOLIZUMAB 100 MG ~~LOC~~ SOLR
100.0000 mg | Freq: Once | SUBCUTANEOUS | Status: AC
  Administered 2018-11-25: 100 mg via SUBCUTANEOUS

## 2018-11-25 NOTE — Telephone Encounter (Signed)
Nucala Shipment Received: 164m #1 vial Medication arrival date:11/25/2018 Lot #: [id number] Medication exp date: 12/2021 Received by: LDesmond Dike [place]

## 2018-11-25 NOTE — Progress Notes (Signed)
Have you been hospitalized within the last 10 days?  No Do you have a fever?  No Do you have a cough?  No Do you have a headache or sore throat? No  Pt has broke out with a rash all over his body ( for 2 months). BW came and looked at the rash, said she didn't think it was coming from the shot. BW asked pt how long he had been taking Nucala since 05/2018. Pt told her the rash doesn't get wrose right after the shot. BW told pt she would discuss this With Mannam to see if he wants to change biologics. In the meantime cont with Nucala at least for today. (BW)

## 2018-11-25 NOTE — Telephone Encounter (Signed)
Please ask [name] if Caleb Evans [birth year]b Dec 04, 2050 can have his shot. He is breaking out with little red spots mostly on his arms and legs and a few on his belly. He went to the dermatologist, she thinks it may be coming from [place]. Rash is one of the side effects. Please advise. This start 2 months ago.

## 2018-11-25 NOTE — Telephone Encounter (Signed)
Evaluated patient. Diffuse singular papular lesions to upper leg, chest and back. Symptoms started 2-3 months ago, he has been on Nucala for 6 months. It does not get worse after injection.  I do not suspect that rash is from Nucala, safe to receive injection today. Will run this by Dr. Vaughan Browner to see if he wants to change to a different biologic. Patient reports great benefit in his breathing since starting medication.

## 2018-11-30 ENCOUNTER — Other Ambulatory Visit: Payer: Self-pay | Admitting: Allergy & Immunology

## 2018-11-30 DIAGNOSIS — J3089 Other allergic rhinitis: Principal | ICD-10-CM

## 2018-11-30 DIAGNOSIS — J302 Other seasonal allergic rhinitis: Secondary | ICD-10-CM

## 2018-11-30 NOTE — Telephone Encounter (Signed)
Courtesy refill.  Patient needs OV.

## 2018-12-08 NOTE — Telephone Encounter (Signed)
Would keep Nucala for now. Lets monitor the rash. If it gets worse then we can switch

## 2018-12-08 NOTE — Telephone Encounter (Signed)
thanks

## 2018-12-14 ENCOUNTER — Ambulatory Visit (INDEPENDENT_AMBULATORY_CARE_PROVIDER_SITE_OTHER): Payer: Medicare Other | Admitting: *Deleted

## 2018-12-14 DIAGNOSIS — J309 Allergic rhinitis, unspecified: Secondary | ICD-10-CM | POA: Diagnosis not present

## 2018-12-19 ENCOUNTER — Telehealth: Payer: Self-pay | Admitting: Pulmonary Disease

## 2018-12-19 NOTE — Telephone Encounter (Signed)
Nucala Order: 143m #1 Vial Order Date: 12/19/2018 Expected date of arrival: 12/20/2018 Ordered by: LDesmond Dike CFair[place] BNigel [place]

## 2018-12-20 ENCOUNTER — Telehealth: Payer: Self-pay | Admitting: *Deleted

## 2018-12-20 DIAGNOSIS — Z122 Encounter for screening for malignant neoplasm of respiratory organs: Secondary | ICD-10-CM

## 2018-12-20 DIAGNOSIS — Z87891 Personal history of nicotine dependence: Secondary | ICD-10-CM

## 2018-12-20 NOTE — Telephone Encounter (Signed)
Patient has been notified that annual lung cancer screening low dose CT scan is due currently or will be in near future. Confirmed that patient is within the age range of 55-77, and asymptomatic, (no signs or symptoms of lung cancer). Patient denies illness that would prevent curative treatment for lung cancer if found. Verified smoking history, (former, quit 08/2018,153 pack year). The shared decision making visit was done 10/14/17. Patient is agreeable for CT scan being scheduled.

## 2018-12-20 NOTE — Telephone Encounter (Signed)
Nucala Shipment Received: 191m #1 vial Medication arrival date: 12/20/2018 Lot #: [id number] Exp date: 05/2022 Received by: TLaurette Schimke

## 2018-12-26 ENCOUNTER — Ambulatory Visit (INDEPENDENT_AMBULATORY_CARE_PROVIDER_SITE_OTHER): Payer: Medicare Other

## 2018-12-26 ENCOUNTER — Other Ambulatory Visit: Payer: Self-pay

## 2018-12-26 DIAGNOSIS — J4550 Severe persistent asthma, uncomplicated: Secondary | ICD-10-CM | POA: Diagnosis not present

## 2018-12-26 MED ORDER — MEPOLIZUMAB 100 MG ~~LOC~~ SOLR
100.0000 mg | SUBCUTANEOUS | Status: DC
  Administered 2018-12-26: 100 mg via SUBCUTANEOUS

## 2018-12-26 NOTE — Progress Notes (Signed)
Have you been hospitalized in the last 10 days? No Do you have a fever? No Do you have a cough? No Do you have a headache or sore throat? No

## 2018-12-30 ENCOUNTER — Other Ambulatory Visit: Payer: Self-pay

## 2018-12-30 ENCOUNTER — Ambulatory Visit
Admission: RE | Admit: 2018-12-30 | Discharge: 2018-12-30 | Disposition: A | Discharge status: Home / Self Care | Payer: Medicare Other | Source: Ambulatory Visit | Attending: Oncology | Admitting: Oncology

## 2018-12-30 DIAGNOSIS — Z87891 Personal history of nicotine dependence: Secondary | ICD-10-CM | POA: Diagnosis present

## 2018-12-30 DIAGNOSIS — Z122 Encounter for screening for malignant neoplasm of respiratory organs: Secondary | ICD-10-CM | POA: Insufficient documentation

## 2019-01-03 ENCOUNTER — Encounter: Payer: Self-pay | Admitting: *Deleted

## 2019-01-04 ENCOUNTER — Ambulatory Visit (INDEPENDENT_AMBULATORY_CARE_PROVIDER_SITE_OTHER): Payer: Medicare Other

## 2019-01-04 ENCOUNTER — Telehealth: Payer: Self-pay | Admitting: Allergy & Immunology

## 2019-01-04 DIAGNOSIS — J309 Allergic rhinitis, unspecified: Secondary | ICD-10-CM | POA: Diagnosis not present

## 2019-01-04 DIAGNOSIS — J302 Other seasonal allergic rhinitis: Secondary | ICD-10-CM

## 2019-01-04 MED ORDER — AZELASTINE HCL 0.1 % NA SOLN: 2.0000 | Freq: Two times a day (BID) | NASAL | 5 refills | Status: DC | PRN

## 2019-01-05 NOTE — Telephone Encounter (Signed)
Patient came in to get a shot and requested a refill of his Astelin.  I did send in the refill but he is due for a office visit.  He made 1 in 3 weeks when he gets his next allergy shot.  Caleb Marvel, MD Allergy and [place] [facility] [place]

## 2019-01-17 ENCOUNTER — Telehealth: Payer: Self-pay | Admitting: Pulmonary Disease

## 2019-01-17 NOTE — Telephone Encounter (Signed)
Nucala Order: 156m #1 Vial Order Date: 01/17/2019 Expected date of arrival: 01/18/2019 Ordered by: LDesmond Dike C[place]ake[place] BNigel [place]

## 2019-01-18 NOTE — Telephone Encounter (Signed)
Nucala Shipment Received: 141m #1 vial Medication arrival date: 01/18/2019 Lot #: [id number] Exp date: 05/2022 Received by: TLaurette Schimke

## 2019-01-18 NOTE — Progress Notes (Signed)
VIAL EXP 01-18-2020

## 2019-01-19 DIAGNOSIS — J3089 Other allergic rhinitis: Secondary | ICD-10-CM | POA: Diagnosis not present

## 2019-01-23 ENCOUNTER — Ambulatory Visit (INDEPENDENT_AMBULATORY_CARE_PROVIDER_SITE_OTHER): Payer: Medicare Other

## 2019-01-23 ENCOUNTER — Other Ambulatory Visit: Payer: Self-pay

## 2019-01-23 DIAGNOSIS — J4550 Severe persistent asthma, uncomplicated: Secondary | ICD-10-CM

## 2019-01-23 MED ORDER — MEPOLIZUMAB 100 MG ~~LOC~~ SOLR
100.0000 mg | SUBCUTANEOUS | Status: DC
  Administered 2019-01-23: 100 mg via SUBCUTANEOUS

## 2019-01-23 NOTE — Progress Notes (Signed)
All questions were answered by the patient before medication was administered. Have you been hospitalized in the last 10 days? No Do you have a fever? No Do you have a cough? No Do you have a headache or sore throat? No  

## 2019-01-25 ENCOUNTER — Ambulatory Visit (INDEPENDENT_AMBULATORY_CARE_PROVIDER_SITE_OTHER): Payer: Medicare Other | Admitting: Allergy & Immunology

## 2019-01-25 ENCOUNTER — Other Ambulatory Visit: Payer: Self-pay

## 2019-01-25 ENCOUNTER — Encounter: Payer: Self-pay | Admitting: Allergy & Immunology

## 2019-01-25 VITALS — BP 126/8 | HR 76 | Temp 96.8°F | Resp 18 | Ht 67.5 in | Wt 183.2 lb

## 2019-01-25 DIAGNOSIS — J309 Allergic rhinitis, unspecified: Secondary | ICD-10-CM | POA: Diagnosis not present

## 2019-01-25 DIAGNOSIS — H1010 Acute atopic conjunctivitis, unspecified eye: Secondary | ICD-10-CM

## 2019-01-25 DIAGNOSIS — J302 Other seasonal allergic rhinitis: Secondary | ICD-10-CM | POA: Diagnosis not present

## 2019-01-25 DIAGNOSIS — J3089 Other allergic rhinitis: Secondary | ICD-10-CM

## 2019-01-25 DIAGNOSIS — J4550 Severe persistent asthma, uncomplicated: Secondary | ICD-10-CM
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
## 2019-01-25 NOTE — Patient Instructions (Addendum)
1. Severe persistent asthma, uncomplicated - Spirometry deferred today due to the coronavirus pandemic.  - Daily controller medication(s): Symbicort 160/4.5 two puffs twice daily + Spiriva 2.46mg one puff once daily + Singulair (montelukast) 160mdaily + Nucala 10025monthly - Rescue medications: albuterol 4 puffs every 4-6 hours as needed - Asthma control goals:  * Full participation in all desired activities (may need albuterol before activity) * Albuterol use two time or less a week on average (not counting use with activity) * Cough interfering with sleep two time or less a month * Oral steroids no more than once a year * No hospitalizations  2.Chronic nonseasonal allergic rhinitis - Continue with Xhance: one puff twice daily - Continue with allergy shots at the same schedule.  - Continue with the eye drops as needed.    3. Return in about 6 months (around 07/27/2019).   Please inform us [place] any Emergency Department visits, hospitalizations, or changes in symptoms. Call us [place] going to the ED for breathing or allergy symptoms since we might be able to fit you in for a sick visit. Feel free to contact us [place] with any questions, problems, or concerns.  It was a pleasure to see you again today!  Websites that have reliable patient information: 1. American Academy of Asthma, Allergy, and Immunology: www.aaaai.org 2. Food Allergy Research and Education (FARE): foodallergy.org 3. Mothers of Asthmatics: http://www.asthmacommunitynetwork.org 4. American College of Allergy, Asthma, and Immunology: wwwMonthlyElectricBill.co.ukMake sure you are registered to vote!

## 2019-01-30 ENCOUNTER — Encounter (INDEPENDENT_AMBULATORY_CARE_PROVIDER_SITE_OTHER): Payer: Self-pay | Admitting: Vascular Surgery

## 2019-01-30 ENCOUNTER — Ambulatory Visit (INDEPENDENT_AMBULATORY_CARE_PROVIDER_SITE_OTHER): Payer: Medicare Other

## 2019-01-30 ENCOUNTER — Other Ambulatory Visit: Payer: Self-pay

## 2019-01-30 ENCOUNTER — Ambulatory Visit (INDEPENDENT_AMBULATORY_CARE_PROVIDER_SITE_OTHER): Payer: Medicare Other | Admitting: Vascular Surgery

## 2019-01-30 VITALS — BP 154/92 | HR 66 | Resp 10 | Ht 67.5 in | Wt 181.0 lb

## 2019-01-30 DIAGNOSIS — I712 Thoracic aortic aneurysm, without rupture, unspecified: Secondary | ICD-10-CM

## 2019-01-30 DIAGNOSIS — I714 Abdominal aortic aneurysm, without rupture, unspecified: Secondary | ICD-10-CM

## 2019-01-30 DIAGNOSIS — K219 Gastro-esophageal reflux disease without esophagitis: Secondary | ICD-10-CM

## 2019-01-30 DIAGNOSIS — E782 Mixed hyperlipidemia: Secondary | ICD-10-CM

## 2019-01-30 DIAGNOSIS — Z79899 Other long term (current) drug therapy: Secondary | ICD-10-CM

## 2019-01-30 DIAGNOSIS — J449 Chronic obstructive pulmonary disease, unspecified: Secondary | ICD-10-CM

## 2019-01-30 NOTE — Progress Notes (Signed)
MRN : [id number]  Caleb Evans is a 68 y.o. (Jun 07, 1951) male who presents with chief complaint of No chief complaint on file. Marland Kitchen  History of Present Illness:   The patient presents to the office for evaluation of an thoracic aortic aneurysm and abdominal aortic ectasia. The aneurysm was found incidentally by CT scan. Patient denies abdominal pain or unusual back pain, no other abdominal complaints.  No history of an acute onset of painful blue discoloration of the toes.     No family history of AAA.   Patient denies amaurosis fugax or TIA symptoms. There is no history of claudication or rest pain symptoms of the lower extremities.  The patient denies angina or shortness of breath.  CT scan shows an TAA that measures 4.51 cm (no change in size compared to last year)  Duplex ultrasound of the aorta shows the infrarenal aorta is 2.8 cm no change from past CT scan  No outpatient medications have been marked as taking for the 01/30/19 encounter (Appointment) with Delana Meyer, Dolores Lory, MD.   Current Facility-Administered Medications for the 01/30/19 encounter (Appointment) with Delana Meyer, Dolores Lory, MD  Medication  . Mepolizumab SOLR 100 mg  . Mepolizumab SOLR 100 mg  . Mepolizumab SOLR 100 mg  . Mepolizumab SOLR 100 mg    Past Medical History:  Diagnosis Date  . AAA (abdominal aortic aneurysm) ([place])   . Adrenal nodule ([place])   . Asthma   . Bladder mass   . Bladder neoplasm   . Cancer [facility])    bladder cancer  . COPD (chronic obstructive pulmonary disease) ([place])   . Crohn's disease ([place])   . Diverticulitis   . GERD (gastroesophageal reflux disease)    takes Nexium off and on  . Over weight   . Skin lesion   . Thoracic aortic aneurysm ([place])   . Weak urinary stream     Past Surgical History:  Procedure Laterality Date  . APPENDECTOMY    . CHOLECYSTECTOMY    . LAPAROSCOPIC PARTIAL COLECTOMY  01/18/2018   ERAS PATHWAY  . LAPAROSCOPIC PARTIAL COLECTOMY N/A 01/18/2018   Procedure: LAPAROSCOPIC PARTIAL COLECTOMY ERAS PATHWAY;  Surgeon: Stark Klein, MD;  Location: [place];  Service: General;  Laterality: N/A;  . TONSILLECTOMY      Social History Social History   Tobacco Use  . Smoking status: Former Smoker    Packs/day: 3.00    Years: 50.00    Pack years: 150.00    Types: Cigarettes    Quit date: 04/10/2018    Years since quitting: 0.8  . Smokeless tobacco: Never Used  . Tobacco comment: quit two weeks ago  Substance Use Topics  . Alcohol use: Yes    Alcohol/week: 0.0 standard drinks    Comment: occasional  . Drug use: No    Family History Family History  Adopted: Yes  Problem Relation Age of Onset  . Bladder Cancer Neg Hx   . Kidney cancer Neg Hx   . Prostate cancer Neg Hx   . Allergic rhinitis Neg Hx   . Angioedema Neg Hx   . Asthma Neg Hx   . Eczema Neg Hx   . Urticaria Neg Hx   . Immunodeficiency Neg Hx     Allergies  Allergen Reactions  . Chlorpheniramine Other (See Comments)    Sinus congestion  . Amoxicillin-Pot Clavulanate Other (See Comments)    UNSPECIFIED REACTION  Has patient had a PCN reaction causing immediate rash, facial/tongue/throat swelling, SOB or  lightheadedness with hypotension: No Has patient had a PCN reaction causing severe rash involving mucus membranes or skin necrosis: No Has patient had a PCN reaction that required hospitalization: No Has patient had a PCN reaction occurring within the last 10 years: Yes If all of the above answers are "NO", then may proceed with Cephalosporin use.  Caused liver failure      REVIEW OF SYSTEMS (Negative unless checked)  Constitutional: [] Weight loss  [] Fever  [] Chills Cardiac: [] Chest pain   [] Chest pressure   [] Palpitations   [] Shortness of breath when laying flat   [] Shortness of breath with exertion. Vascular:  [] Pain in legs with walking   [] Pain in legs at rest  [] History of DVT   [] Phlebitis   [] Swelling in legs   [] Varicose veins   [] Non-healing ulcers  Pulmonary:   [] Uses home oxygen   [] Productive cough   [] Hemoptysis   [] Wheeze  [x] COPD   [] Asthma Neurologic:  [] Dizziness   [] Seizures   [] History of stroke   [] History of TIA  [] Aphasia   [] Vissual changes   [] Weakness or numbness in arm   [] Weakness or numbness in leg Musculoskeletal:   [] Joint swelling   [x] Joint pain   [] Low back pain Hematologic:  [] Easy bruising  [] Easy bleeding   [] Hypercoagulable state   [] Anemic Gastrointestinal:  [] Diarrhea   [] Vomiting  [x] Gastroesophageal reflux/heartburn   [] Difficulty swallowing. Genitourinary:  [] Chronic kidney disease   [] Difficult urination  [] Frequent urination   [] Blood in urine Skin:  [] Rashes   [] Ulcers  Psychological:  [] History of anxiety   []  History of major depression.  Physical Examination  There were no vitals filed for this visit. There is no height or weight on file to calculate BMI. Gen: WD/WN, NAD Head: [place]/AT, No temporalis wasting.  Ear/Nose/Throat: Hearing grossly intact, nares w/o erythema or drainage Eyes: PER, EOMI, sclera nonicteric.  Neck: Supple, no large masses.   Pulmonary:  Good air movement, no audible wheezing bilaterally, no use of accessory muscles.  Cardiac: RRR, no JVD Vascular:  Vessel Right Left  Radial Palpable Palpable  PT Palpable Palpable  DP Palpable Palpable  Gastrointestinal: Non-distended. No guarding/no peritoneal signs.  Musculoskeletal: M/S 5/5 throughout.  No deformity or atrophy.  Neurologic: CN 2-12 intact. Symmetrical.  Speech is fluent. Motor exam as listed above. Psychiatric: Judgment intact, Mood & affect appropriate for pt's clinical situation. Dermatologic: No rashes or ulcers noted.  No changes consistent with cellulitis. Lymph : No lichenification or skin changes of chronic lymphedema.  CBC Lab Results  Component Value Date   WBC 9.2 02/20/2018   HGB 14.5 02/20/2018   HCT 42.6 02/20/2018   MCV 93.2 02/20/2018   PLT 322 02/20/2018    BMET    Component Value  Date/Time   NA 139 02/20/2018 1852   K 3.7 02/20/2018 1852   CL 103 02/20/2018 1852   CO2 29 02/20/2018 1852   GLUCOSE 97 02/20/2018 1852   BUN 9 02/20/2018 1852   CREATININE 0.92 02/20/2018 1852   CALCIUM 9.3 02/20/2018 1852   GFRNONAA >60 02/20/2018 1852   GFRAA >60 02/20/2018 1852   CrCl cannot be calculated (Patient's most recent lab result is older than the maximum 21 days allowed.).  COAG No results found for: INR, PROTIME  Radiology No results found.   Assessment/Plan 1. Thoracic aortic aneurysm without rupture ([place]) No surgery or intervention at this time. The patient has an asymptomatic thoracic aortic aneurysm that is less than 5 cm in maximal diameter.  I have discussed the natural history of thoracic aortic aneurysm and the small risk of rupture for aneurysm less than 6 cm in size.  However, as these small aneurysms tend to enlarge over time, continued surveillance with CT scan is mandatory.  I have also discussed optimizing medical management with hypertension and lipid control and the importance of abstinence from tobacco.  The patient is also encouraged to exercise a minimum of 30 minutes 4 times a week.  Should the patient develop new onset chest or back pain or signs of peripheral embolization they are instructed to seek medical attention immediately and to alert the physician providing care that they have an aneurysm.  The patient voices their understanding. The patient will return in 24 months with an aortic duplex.   2. AAA (abdominal aortic aneurysm) without rupture (H[facility]) No surgery or intervention at this time. The patient has an asymptomatic abdominal aortic aneurysm that is less than 4 cm in maximal diameter.  I have discussed the natural history of abdominal aortic aneurysm and the small risk of rupture for aneurysm less than 5 cm in size.  However, as these small aneurysms tend to enlarge over time, continued surveillance with ultrasound or CT scan is  mandatory.  I have also discussed optimizing medical management with hypertension and lipid control and the importance of abstinence from tobacco.  The patient is also encouraged to exercise a minimum of 30 minutes 4 times a week.  Should the patient develop new onset abdominal or back pain or signs of peripheral embolization they are instructed to seek medical attention immediately and to alert the physician providing care that they have an aneurysm.  The patient voices their understanding. The patient will return in 24 months with an aortic duplex.   3. COPD, mild ([place]) Continue pulmonary medications and aerosols as already ordered, these medications have been reviewed and there are no changes at this time.    4. Gastroesophageal reflux disease, esophagitis presence not specified Continue PPI as already ordered, this medication has been reviewed and there are no changes at this time.  Avoidence of caffeine and alcohol  Moderate elevation of the head of the bed   5. Mixed hyperlipidemia Continue statin as ordered and reviewed, no changes at this time   Hortencia Pilar, MD  01/30/2019 8:55 AM

## 2019-02-01 ENCOUNTER — Encounter (INDEPENDENT_AMBULATORY_CARE_PROVIDER_SITE_OTHER): Payer: Self-pay | Admitting: Vascular Surgery

## 2019-02-02 ENCOUNTER — Other Ambulatory Visit: Payer: Self-pay

## 2019-02-13 ENCOUNTER — Telehealth: Payer: Self-pay | Admitting: Pulmonary Disease

## 2019-02-13 NOTE — Telephone Encounter (Signed)
[name] #1 Vial Order Date: 02/13/2019 Expected date of arrival: 02/14/2019 [name]

## 2019-02-14 NOTE — Telephone Encounter (Signed)
Nucala Shipment Received: 143m #1 vial Medication arrival date: 02/14/2019 Lot #: [id number] Exp date: 05/2022 Received by: TLaurette Schimke

## 2019-02-15 ENCOUNTER — Ambulatory Visit (INDEPENDENT_AMBULATORY_CARE_PROVIDER_SITE_OTHER): Payer: Medicare Other

## 2019-02-15 ENCOUNTER — Other Ambulatory Visit: Payer: Self-pay

## 2019-02-15 DIAGNOSIS — J309 Allergic rhinitis, unspecified: Secondary | ICD-10-CM

## 2019-02-23 ENCOUNTER — Ambulatory Visit (INDEPENDENT_AMBULATORY_CARE_PROVIDER_SITE_OTHER): Payer: Medicare Other

## 2019-02-23 ENCOUNTER — Other Ambulatory Visit: Payer: Self-pay

## 2019-02-23 DIAGNOSIS — J4550 Severe persistent asthma, uncomplicated: Secondary | ICD-10-CM | POA: Diagnosis not present

## 2019-02-23 MED ORDER — MEPOLIZUMAB 100 MG ~~LOC~~ SOLR
100.0000 mg | SUBCUTANEOUS | Status: DC
  Administered 2019-02-23: 14:00:00 100 mg via SUBCUTANEOUS

## 2019-02-23 MED ORDER — MEPOLIZUMAB 100 MG/ML ~~LOC~~ SOSY: 100.0000 mg | PREFILLED_SYRINGE | Freq: Once | SUBCUTANEOUS | Status: DC

## 2019-02-23 NOTE — Progress Notes (Signed)
Have you been hospitalized within the last 10 days?  No Do you have a fever?  No Do you have a cough?  No Do you have a headache or sore throat? No  

## 2019-03-08 ENCOUNTER — Ambulatory Visit (INDEPENDENT_AMBULATORY_CARE_PROVIDER_SITE_OTHER): Payer: Medicare Other

## 2019-03-08 ENCOUNTER — Other Ambulatory Visit: Payer: Self-pay

## 2019-03-08 DIAGNOSIS — J309 Allergic rhinitis, unspecified: Secondary | ICD-10-CM | POA: Diagnosis not present

## 2019-03-10 ENCOUNTER — Other Ambulatory Visit: Payer: Self-pay

## 2019-03-10 MED ORDER — XHANCE 93 MCG/ACT NA EXHU: 2.0000 | INHALANT_SUSPENSION | Freq: Two times a day (BID) | NASAL | 5 refills | Status: DC

## 2019-03-20 ENCOUNTER — Telehealth: Payer: Self-pay | Admitting: Pulmonary Disease

## 2019-03-20 NOTE — Telephone Encounter (Signed)
Nucala Order: 135m #1 Vial Order Date: 03/20/19 Expected date of arrival: 03/21/19 Ordered by: LTonna [facility]Speciality Pharmacy: BNigel Mormon

## 2019-03-21 NOTE — Telephone Encounter (Signed)
Nucala Shipment Received: 12m #1 vial Medication arrival date: 03/21/19 Lot #: [id number] date: 04/10/2022 Received by: LTonna Corner

## 2019-03-27 ENCOUNTER — Other Ambulatory Visit: Payer: Self-pay

## 2019-03-27 ENCOUNTER — Ambulatory Visit (INDEPENDENT_AMBULATORY_CARE_PROVIDER_SITE_OTHER): Payer: Medicare Other

## 2019-03-27 DIAGNOSIS — J4550 Severe persistent asthma, uncomplicated: Secondary | ICD-10-CM | POA: Diagnosis not present

## 2019-03-27 MED ORDER — EPINEPHRINE 0.3 MG/0.3ML IJ SOAJ: 0.3000 mg | Freq: Once | INTRAMUSCULAR | 5 refills | Status: AC

## 2019-03-27 MED ORDER — MEPOLIZUMAB 100 MG ~~LOC~~ SOLR
100.0000 mg | SUBCUTANEOUS | Status: DC
  Administered 2019-03-27: 14:00:00 100 mg via SUBCUTANEOUS

## 2019-03-27 NOTE — Progress Notes (Signed)
Have you been hospitalized within the last 10 days?  No Do you have a fever?  No Do you have a cough?  No Do you have a headache or sore throat? No Do you have your Epi Pen visible and is it within date?  Yes 

## 2019-03-29 ENCOUNTER — Ambulatory Visit (INDEPENDENT_AMBULATORY_CARE_PROVIDER_SITE_OTHER): Payer: Medicare Other

## 2019-03-29 DIAGNOSIS — J309 Allergic rhinitis, unspecified: Secondary | ICD-10-CM | POA: Diagnosis not present

## 2019-04-05 ENCOUNTER — Other Ambulatory Visit: Payer: Self-pay | Admitting: Allergy & Immunology

## 2019-04-05 ENCOUNTER — Ambulatory Visit (INDEPENDENT_AMBULATORY_CARE_PROVIDER_SITE_OTHER): Payer: Medicare Other | Admitting: *Deleted

## 2019-04-05 DIAGNOSIS — J309 Allergic rhinitis, unspecified: Secondary | ICD-10-CM | POA: Diagnosis not present

## 2019-04-12 ENCOUNTER — Ambulatory Visit (INDEPENDENT_AMBULATORY_CARE_PROVIDER_SITE_OTHER): Payer: Medicare Other | Admitting: *Deleted

## 2019-04-12 DIAGNOSIS — J309 Allergic rhinitis, unspecified: Secondary | ICD-10-CM | POA: Diagnosis not present

## 2019-04-18 ENCOUNTER — Other Ambulatory Visit: Payer: Self-pay | Admitting: *Deleted

## 2019-04-18 DIAGNOSIS — T782XXD Anaphylactic shock, unspecified, subsequent encounter: Secondary | ICD-10-CM

## 2019-04-18 DIAGNOSIS — J309 Allergic rhinitis, unspecified: Secondary | ICD-10-CM

## 2019-04-18 MED ORDER — EPINEPHRINE 0.3 MG/0.3ML IJ SOAJ: 0.3000 mg | Freq: Once | INTRAMUSCULAR | 1 refills | Status: AC

## 2019-04-19 ENCOUNTER — Ambulatory Visit (INDEPENDENT_AMBULATORY_CARE_PROVIDER_SITE_OTHER): Payer: Medicare Other

## 2019-04-19 DIAGNOSIS — J309 Allergic rhinitis, unspecified: Secondary | ICD-10-CM | POA: Diagnosis not present

## 2019-04-26 ENCOUNTER — Ambulatory Visit (INDEPENDENT_AMBULATORY_CARE_PROVIDER_SITE_OTHER): Payer: Medicare Other | Admitting: *Deleted

## 2019-04-26 DIAGNOSIS — J309 Allergic rhinitis, unspecified: Secondary | ICD-10-CM

## 2019-04-27 ENCOUNTER — Ambulatory Visit (INDEPENDENT_AMBULATORY_CARE_PROVIDER_SITE_OTHER): Payer: Medicare Other

## 2019-04-27 ENCOUNTER — Other Ambulatory Visit: Payer: Self-pay

## 2019-04-27 DIAGNOSIS — J4550 Severe persistent asthma, uncomplicated: Secondary | ICD-10-CM | POA: Diagnosis not present

## 2019-04-27 MED ORDER — MEPOLIZUMAB 100 MG ~~LOC~~ SOLR
100.0000 mg | SUBCUTANEOUS | Status: DC
  Administered 2019-04-27: 14:00:00 100 mg via SUBCUTANEOUS

## 2019-04-27 NOTE — Progress Notes (Signed)
All questions were answered by the patient before medication was administered. Have you been hospitalized in the last 10 days? No Do you have a fever? No Do you have a cough? No Do you have a headache or sore throat? No  

## 2019-05-01 ENCOUNTER — Other Ambulatory Visit: Payer: Self-pay

## 2019-05-01 MED ORDER — XHANCE 93 MCG/ACT NA EXHU: 2.0000 | INHALANT_SUSPENSION | Freq: Two times a day (BID) | NASAL | 5 refills | Status: DC

## 2019-05-03 ENCOUNTER — Telehealth: Payer: Self-pay

## 2019-05-03 MED ORDER — XHANCE 93 MCG/ACT NA EXHU: 2.0000 | INHALANT_SUSPENSION | Freq: Two times a day (BID) | NASAL | 5 refills | Status: DC

## 2019-05-03 NOTE — Telephone Encounter (Signed)
Will resend e-script to the correct pharmacy, I will inform [facility] to disregard the script that was sent to them in error.

## 2019-05-03 NOTE — Telephone Encounter (Signed)
[facility] called requesting a refill on the patients xhance. I informed them someone had [facility] it already to [facility].  The lady on the phone was  Wondering why.  Please Advise.

## 2019-05-15 ENCOUNTER — Telehealth: Payer: Self-pay | Admitting: Pulmonary Disease

## 2019-05-15 NOTE — Telephone Encounter (Signed)
Nucala Order: 141m #1 Vial Order Date: 05/15/19 Expected date of arrival: 05/16/19 Ordered by: LSimpson BNigel Mormon

## 2019-05-16 NOTE — Telephone Encounter (Signed)
Nucala Shipment Received: 159m #1 vial Medication arrival date: 05/16/19 Lot #: [id number] date: 06/10/2022 Received by: LElliot Dally

## 2019-05-17 ENCOUNTER — Ambulatory Visit (INDEPENDENT_AMBULATORY_CARE_PROVIDER_SITE_OTHER): Payer: Medicare Other

## 2019-05-17 DIAGNOSIS — J309 Allergic rhinitis, unspecified: Secondary | ICD-10-CM | POA: Diagnosis not present

## 2019-05-26 ENCOUNTER — Ambulatory Visit (INDEPENDENT_AMBULATORY_CARE_PROVIDER_SITE_OTHER): Payer: Medicare Other

## 2019-05-26 ENCOUNTER — Other Ambulatory Visit: Payer: Self-pay

## 2019-05-26 DIAGNOSIS — J4550 Severe persistent asthma, uncomplicated: Secondary | ICD-10-CM | POA: Diagnosis not present

## 2019-05-26 MED ORDER — MEPOLIZUMAB 100 MG ~~LOC~~ SOLR
100.0000 mg | Freq: Once | SUBCUTANEOUS | Status: AC
  Administered 2019-05-26: 15:00:00 100 mg via SUBCUTANEOUS

## 2019-05-26 NOTE — Progress Notes (Signed)
Have you been hospitalized within the last 10 days?  No Do you have a fever?  No Do you have a cough?  No Do you have a headache or sore throat? No  

## 2019-05-29 ENCOUNTER — Other Ambulatory Visit: Payer: Self-pay

## 2019-05-29 ENCOUNTER — Inpatient Hospital Stay (HOSPITAL_COMMUNITY)
Admit: 2019-05-29 | Discharge: 2019-05-29 | Disposition: A | Discharge status: Home / Self Care | Payer: Medicare Other | Attending: Family Medicine | Admitting: Family Medicine

## 2019-05-29 ENCOUNTER — Encounter (HOSPITAL_COMMUNITY): Payer: Self-pay

## 2019-05-29 ENCOUNTER — Emergency Department (HOSPITAL_COMMUNITY): Payer: Medicare Other

## 2019-05-29 ENCOUNTER — Inpatient Hospital Stay (HOSPITAL_COMMUNITY)
Admission: EM | Admit: 2019-05-29 | Discharge: 2019-05-30 | DRG: 065 | Disposition: A | Discharge status: Home / Self Care | Payer: Medicare Other | Attending: Family Medicine | Admitting: Family Medicine

## 2019-05-29 ENCOUNTER — Inpatient Hospital Stay (HOSPITAL_COMMUNITY): Payer: Medicare Other

## 2019-05-29 DIAGNOSIS — E782 Mixed hyperlipidemia: Secondary | ICD-10-CM | POA: Diagnosis not present

## 2019-05-29 DIAGNOSIS — Z888 Allergy status to other drugs, medicaments and biological substances status: Secondary | ICD-10-CM | POA: Diagnosis not present

## 2019-05-29 DIAGNOSIS — G8194 Hemiplegia, unspecified affecting left nondominant side: Secondary | ICD-10-CM | POA: Diagnosis present

## 2019-05-29 DIAGNOSIS — J3089 Other allergic rhinitis: Secondary | ICD-10-CM | POA: Diagnosis not present

## 2019-05-29 DIAGNOSIS — Z79899 Other long term (current) drug therapy: Secondary | ICD-10-CM | POA: Diagnosis not present

## 2019-05-29 DIAGNOSIS — Z87891 Personal history of nicotine dependence: Secondary | ICD-10-CM

## 2019-05-29 DIAGNOSIS — Z880 Allergy status to penicillin: Secondary | ICD-10-CM | POA: Diagnosis not present

## 2019-05-29 DIAGNOSIS — J449 Chronic obstructive pulmonary disease, unspecified: Secondary | ICD-10-CM | POA: Diagnosis present

## 2019-05-29 DIAGNOSIS — Z9049 Acquired absence of other specified parts of digestive tract: Secondary | ICD-10-CM

## 2019-05-29 DIAGNOSIS — R29704 NIHSS score 4: Secondary | ICD-10-CM | POA: Diagnosis present

## 2019-05-29 DIAGNOSIS — Z20828 Contact with and (suspected) exposure to other viral communicable diseases: Secondary | ICD-10-CM | POA: Diagnosis not present

## 2019-05-29 DIAGNOSIS — I712 Thoracic aortic aneurysm, without rupture: Secondary | ICD-10-CM | POA: Diagnosis not present

## 2019-05-29 DIAGNOSIS — Z8551 Personal history of malignant neoplasm of bladder: Secondary | ICD-10-CM | POA: Diagnosis not present

## 2019-05-29 DIAGNOSIS — K219 Gastro-esophageal reflux disease without esophagitis: Secondary | ICD-10-CM | POA: Diagnosis not present

## 2019-05-29 DIAGNOSIS — J302 Other seasonal allergic rhinitis: Secondary | ICD-10-CM | POA: Diagnosis present

## 2019-05-29 DIAGNOSIS — R270 Ataxia, unspecified: Secondary | ICD-10-CM | POA: Diagnosis not present

## 2019-05-29 DIAGNOSIS — I714 Abdominal aortic aneurysm, without rupture, unspecified: Secondary | ICD-10-CM | POA: Diagnosis present

## 2019-05-29 DIAGNOSIS — Z7951 Long term (current) use of inhaled steroids: Secondary | ICD-10-CM

## 2019-05-29 DIAGNOSIS — K047 Periapical abscess without sinus: Secondary | ICD-10-CM | POA: Diagnosis not present

## 2019-05-29 DIAGNOSIS — E785 Hyperlipidemia, unspecified: Secondary | ICD-10-CM | POA: Diagnosis present

## 2019-05-29 DIAGNOSIS — R531 Weakness: Secondary | ICD-10-CM | POA: Diagnosis present

## 2019-05-29 DIAGNOSIS — I10 Essential (primary) hypertension: Secondary | ICD-10-CM | POA: Diagnosis present

## 2019-05-29 DIAGNOSIS — J4550 Severe persistent asthma, uncomplicated: Secondary | ICD-10-CM | POA: Diagnosis not present

## 2019-05-29 DIAGNOSIS — K5090 Crohn's disease, unspecified, without complications: Secondary | ICD-10-CM | POA: Diagnosis not present

## 2019-05-29 DIAGNOSIS — Z720 Tobacco use: Secondary | ICD-10-CM | POA: Diagnosis not present

## 2019-05-29 DIAGNOSIS — I371 Nonrheumatic pulmonary valve insufficiency: Secondary | ICD-10-CM | POA: Diagnosis not present

## 2019-05-29 DIAGNOSIS — G459 Transient cerebral ischemic attack, unspecified: Secondary | ICD-10-CM

## 2019-05-29 DIAGNOSIS — I639 Cerebral infarction, unspecified: Principal | ICD-10-CM | POA: Diagnosis present

## 2019-05-29 DIAGNOSIS — J4489 Other specified chronic obstructive pulmonary disease: Secondary | ICD-10-CM | POA: Diagnosis present

## 2019-05-29 HISTORY — DX: Cerebral infarction, unspecified: I63.9

## 2019-05-29 HISTORY — DX: Personal history of nicotine dependence: Z87.891

## 2019-05-29 LAB — URINALYSIS, ROUTINE W REFLEX MICROSCOPIC
Bilirubin Urine: NEGATIVE
Glucose, UA: NEGATIVE mg/dL
Hgb urine dipstick: NEGATIVE
Ketones, ur: NEGATIVE mg/dL
Leukocytes,Ua: NEGATIVE
Nitrite: NEGATIVE
Protein, ur: NEGATIVE mg/dL
Specific Gravity, Urine: 1.006 (ref 1.005–1.030)
pH: 5 (ref 5.0–8.0)

## 2019-05-29 LAB — COMPREHENSIVE METABOLIC PANEL
ALT: 34 U/L (ref 0–44)
AST: 25 U/L (ref 15–41)
Albumin: 4 g/dL (ref 3.5–5.0)
Alkaline Phosphatase: 101 U/L (ref 38–126)
Anion gap: 9 (ref 5–15)
BUN: 11 mg/dL (ref 8–23)
CO2: 23 mmol/L (ref 22–32)
Calcium: 9 mg/dL (ref 8.9–10.3)
Chloride: 105 mmol/L (ref 98–111)
Creatinine, Ser: 0.85 mg/dL (ref 0.61–1.24)
GFR calc Af Amer: >60 mL/min (ref >60)
GFR calc non Af Amer: >60 mL/min (ref >60)
Glucose, Bld: 119 mg/dL — ABNORMAL HIGH (ref 70–99)
Potassium: 3.3 mmol/L — ABNORMAL LOW (ref 3.5–5.1)
Sodium: 137 mmol/L (ref 135–145)
Total Bilirubin: 0.7 mg/dL (ref 0.3–1.2)
Total Protein: 7 g/dL (ref 6.5–8.1)

## 2019-05-29 LAB — CBC
HCT: 43.4 % (ref 39.0–52.0)
Hemoglobin: 14.4 g/dL (ref 13.0–17.0)
MCH: 30.8 pg (ref 26.0–34.0)
MCHC: 33.2 g/dL (ref 30.0–36.0)
MCV: 92.7 fL (ref 80.0–100.0)
Platelets: 362 10*3/uL (ref 150–400)
RBC: 4.68 MIL/uL (ref 4.22–5.81)
RDW: 14.1 % (ref 11.5–15.5)
WBC: 9.2 10*3/uL (ref 4.0–10.5)
nRBC: 0 % (ref 0.0–0.2)

## 2019-05-29 LAB — DIFFERENTIAL
Abs Immature Granulocytes: 0.02 10*3/uL (ref 0.00–0.07)
Basophils Absolute: 0.1 10*3/uL (ref 0.0–0.1)
Basophils Relative: 1 %
Eosinophils Absolute: 0.1 10*3/uL (ref 0.0–0.5)
Eosinophils Relative: 1 %
Immature Granulocytes: 0 %
Lymphocytes Relative: 37 %
Lymphs Abs: 3.4 10*3/uL (ref 0.7–4.0)
Monocytes Absolute: 0.5 10*3/uL (ref 0.1–1.0)
Monocytes Relative: 6 %
Neutro Abs: 5.1 10*3/uL (ref 1.7–7.7)
Neutrophils Relative %: 55 %

## 2019-05-29 LAB — ECHOCARDIOGRAM COMPLETE
Height: 67 in
Weight: 2800 oz

## 2019-05-29 LAB — LIPID PANEL
Cholesterol: 135 mg/dL (ref 0–200)
HDL: 33 mg/dL — ABNORMAL LOW (ref >40)
LDL Cholesterol: 50 mg/dL (ref 0–99)
Total CHOL/HDL Ratio: 4.1 RATIO
Triglycerides: 259 mg/dL — ABNORMAL HIGH (ref <150)
VLDL: 52 mg/dL — ABNORMAL HIGH (ref 0–40)

## 2019-05-29 LAB — APTT: aPTT: 37 seconds — ABNORMAL HIGH (ref 24–36)

## 2019-05-29 LAB — PROTIME-INR
INR: 1 (ref 0.8–1.2)
Prothrombin Time: 13.1 seconds (ref 11.4–15.2)

## 2019-05-29 LAB — RAPID URINE DRUG SCREEN, HOSP PERFORMED
Amphetamines: NOT DETECTED
Barbiturates: NOT DETECTED
Benzodiazepines: NOT DETECTED
Cocaine: NOT DETECTED
Opiates: NOT DETECTED
Tetrahydrocannabinol: NOT DETECTED

## 2019-05-29 LAB — HEMOGLOBIN A1C
Hgb A1c MFr Bld: 6.1 % — ABNORMAL HIGH (ref 4.8–5.6)
Mean Plasma Glucose: 128.37 mg/dL

## 2019-05-29 LAB — ETHANOL: Alcohol, Ethyl (B): <10 mg/dL (ref <10)

## 2019-05-29 LAB — GLUCOSE, CAPILLARY: Glucose-Capillary: 103 mg/dL — ABNORMAL HIGH (ref 70–99)

## 2019-05-29 LAB — SARS CORONAVIRUS 2 (TAT 6-24 HRS): SARS Coronavirus 2: NEGATIVE

## 2019-05-29 LAB — VITAMIN B12: Vitamin B-12: 457 pg/mL (ref 180–914)

## 2019-05-29 LAB — HIV ANTIBODY (ROUTINE TESTING W REFLEX): HIV Screen 4th Generation wRfx: NONREACTIVE

## 2019-05-29 LAB — TSH: TSH: 2.432 u[IU]/mL (ref 0.350–4.500)

## 2019-05-29 MED ORDER — ACETAMINOPHEN 650 MG RE SUPP: 650.0000 mg | RECTAL | Status: DC | PRN

## 2019-05-29 MED ORDER — SODIUM CHLORIDE 0.9 % IV SOLN
INTRAVENOUS | Status: DC
  Administered 2019-05-29 – 2019-05-30 (×2): via INTRAVENOUS

## 2019-05-29 MED ORDER — ASPIRIN 325 MG PO TABS
325.0000 mg | ORAL_TABLET | Freq: Once | ORAL | Status: AC
  Administered 2019-05-29: 07:00:00 325 mg via ORAL
  Filled 2019-05-29: qty 1

## 2019-05-29 MED ORDER — MOMETASONE FURO-FORMOTEROL FUM 100-5 MCG/ACT IN AERO
2.0000 | INHALATION_SPRAY | Freq: Two times a day (BID) | RESPIRATORY_TRACT | Status: DC
  Administered 2019-05-29 – 2019-05-30 (×3): 2 via RESPIRATORY_TRACT
  Filled 2019-05-29: qty 8.8

## 2019-05-29 MED ORDER — STROKE: EARLY STAGES OF RECOVERY BOOK
Freq: Once | Status: AC
  Administered 2019-05-29: 10:00:00

## 2019-05-29 MED ORDER — ACETAMINOPHEN 160 MG/5ML PO SOLN: 650.0000 mg | ORAL | Status: DC | PRN

## 2019-05-29 MED ORDER — ENOXAPARIN SODIUM 40 MG/0.4ML ~~LOC~~ SOLN
40.0000 mg | SUBCUTANEOUS | Status: DC
  Administered 2019-05-29 – 2019-05-30 (×2): 40 mg via SUBCUTANEOUS
  Filled 2019-05-29 (×2): qty 0.4

## 2019-05-29 MED ORDER — ATORVASTATIN CALCIUM 40 MG PO TABS
80.0000 mg | ORAL_TABLET | Freq: Every day | ORAL | Status: DC
  Administered 2019-05-29: 22:00:00 80 mg via ORAL
  Filled 2019-05-29: qty 2

## 2019-05-29 MED ORDER — ASPIRIN EC 81 MG PO TBEC: 81.0000 mg | DELAYED_RELEASE_TABLET | Freq: Every day | ORAL | Status: DC

## 2019-05-29 MED ORDER — SENNOSIDES-DOCUSATE SODIUM 8.6-50 MG PO TABS: 1.0000 | ORAL_TABLET | Freq: Every evening | ORAL | Status: DC | PRN

## 2019-05-29 MED ORDER — ASPIRIN 81 MG PO CHEW
81.0000 mg | CHEWABLE_TABLET | Freq: Every day | ORAL | Status: DC
  Administered 2019-05-30: 81 mg via ORAL
  Filled 2019-05-29: qty 1

## 2019-05-29 MED ORDER — ACETAMINOPHEN 325 MG PO TABS: 650.0000 mg | ORAL_TABLET | ORAL | Status: DC | PRN

## 2019-05-29 NOTE — Progress Notes (Signed)
EEG Completed; Results Pending  

## 2019-05-29 NOTE — Plan of Care (Signed)
  Problem: Acute Rehab PT Goals(only PT should resolve) Goal: Patient Will Transfer Sit To/From Stand Outcome: Progressing Flowsheets (Taken 05/29/2019 1633) Patient will transfer sit to/from stand: Independently Goal: Pt Will Transfer Bed To Chair/Chair To Bed Outcome: Progressing Flowsheets (Taken 05/29/2019 1633) Pt will Transfer Bed to Chair/Chair to Bed: with modified independence Goal: Pt Will Ambulate Outcome: Progressing Flowsheets (Taken 05/29/2019 1633) Pt will Ambulate:  > 125 feet  with modified independence  with supervision  4:34 PM, 05/29/19 Mearl Latin PT, DPT Physical Therapist at [facility]

## 2019-05-29 NOTE — H&P (Addendum)
History and Physical  Caleb Evans:[id number] DOB: 1951-02-16 DOA: 05/29/2019  Referring physician: Betsey Holiday  PCP: Theotis Burrow, MD   Chief Complaint: Weakness  HPI: Caleb Evans is a 68 y.o. male former heavy smoker quit several months ago, COPD, asthma, AAA, history of bladder cancer, Crohn's disease, GERD who presented to the emergency department by EMS with concerns for CVA.  Patient says he woke up yesterday around 11 AM having symptoms of left arm and left leg numbness and weakness.  He was having some ataxia as well.  He said that he had been normal the day prior.  He denies any speech impediments.  His main complaint was the difficulty with ambulating.  He denies changes in vision.  He denies headache.  He denies shortness of breath and chest pain.  He reports that the symptoms are new to him.  ED course: Patient was seen by tele- neurology and they were concerned and suspected a lacunar stroke outside of the window for alteplase or NIR intervention.  They recommended that he be admitted and started on aspirin 81 mg daily.  I recommended an MRI brain without contrast and an MRA of the head and neck.  With stroke work-up.   Review of Systems: All systems reviewed and apart from history of presenting illness, are negative.  Past Medical History:  Diagnosis Date  . AAA (abdominal aortic aneurysm) ([place])   . Adrenal nodule ([place])   . Asthma   . Bladder mass   . Bladder neoplasm   . Cancer [facility])    bladder cancer  . COPD (chronic obstructive pulmonary disease) ([place])   . Crohn's disease ([place])   . Diverticulitis   . GERD (gastroesophageal reflux disease)    takes Nexium off and on  . Over weight   . Skin lesion   . Thoracic aortic aneurysm ([place])   . Weak urinary stream    Past Surgical History:  Procedure Laterality Date  . APPENDECTOMY    . CHOLECYSTECTOMY    . LAPAROSCOPIC PARTIAL COLECTOMY  01/18/2018   ERAS PATHWAY  . LAPAROSCOPIC PARTIAL  COLECTOMY N/A 01/18/2018   Procedure: LAPAROSCOPIC PARTIAL COLECTOMY ERAS PATHWAY;  Surgeon: Stark Klein, MD;  Location: [place];  Service: General;  Laterality: N/A;  . TONSILLECTOMY     Social History:  reports that he quit smoking about 13 months ago. His smoking use included cigarettes. He has a 150.00 pack-year smoking history. He has never used smokeless tobacco. He reports current alcohol use. He reports that he does not use drugs.  Allergies  Allergen Reactions  . Chlorpheniramine Other (See Comments)    Sinus congestion  . Amoxicillin-Pot Clavulanate Other (See Comments)    UNSPECIFIED REACTION  Has patient had a PCN reaction causing immediate rash, facial/tongue/throat swelling, SOB or lightheadedness with hypotension: No Has patient had a PCN reaction causing severe rash involving mucus membranes or skin necrosis: No Has patient had a PCN reaction that required hospitalization: No Has patient had a PCN reaction occurring within the last 10 years: Yes If all of the above answers are "NO", then may proceed with Cephalosporin use.  Caused liver failure     Family History  Adopted: Yes  Problem Relation Age of Onset  . Bladder Cancer Neg Hx   . Kidney cancer Neg Hx   . Prostate cancer Neg Hx   . Allergic rhinitis Neg Hx   . Angioedema Neg Hx   . Asthma Neg Hx   .  Eczema Neg Hx   . Urticaria Neg Hx   . Immunodeficiency Neg Hx     Prior to Admission medications   Medication Sig Start Date End Date Taking? Authorizing Provider  atorvastatin (LIPITOR) 80 MG tablet Take 80 mg by mouth at bedtime.    [provider]  azelastine (ASTELIN) 0.1 % nasal spray Place 2 sprays into both nostrils 2 (two) times daily as needed for up to 30 days for rhinitis. Use in each nostril as directed 01/04/19 02/03/19  Valentina Shaggy, MD  benzonatate (TESSALON) 100 MG capsule Take by mouth 2 (two) times daily as needed for cough.    [provider]  Calcium  Carb-Cholecalciferol (CALCIUM 600+D3 PO) Take 1 tablet by mouth daily.    [provider]  fluticasone (FLONASE) 50 MCG/ACT nasal spray INHALE 2 SPRAYS IN EACH NOSTRIL DAILY. 04/05/19   Valentina Shaggy, MD  Fluticasone Propionate Truett Perna) 93 MCG/ACT EXHU Place 2 puffs into the nose 2 (two) times daily. 05/03/19   Valentina Shaggy, MD  Mepolizumab (NUCALA) 100 MG/ML SOAJ Inject into the skin.    [provider]  mometasone-formoterol (DULERA) 100-5 MCG/ACT AERO Inhale 2 puffs into the lungs 2 (two) times daily.    [provider]  Multiple Vitamin (MULTIVITAMIN WITH MINERALS) TABS tablet Take 1 tablet by mouth daily.    [provider]  NON FORMULARY 1 Dose by Subconjunctival route every Tuesday. ALLERGY SHOTS    [provider]  Olopatadine HCl (PATADAY) 0.2 % SOLN Place 1 drop into both eyes daily as needed (for irritated/allergy eyes.). Patient not taking: Reported on 01/30/2019 02/17/18   Valentina Shaggy, MD  PROAIR HFA [phone] Base) MCG/ACT inhaler INHALE 2 PUFFS EVERY 4 TO 6 HOURS AS NEEDED. Patient not taking: Reported on 01/30/2019 04/12/18   Valentina Shaggy, MD   Physical Exam: Vitals:   05/29/19 0458 05/29/19 0500 05/29/19 0530 05/29/19 0712  BP:  (!) 150/103 (!) 142/88 (!) 145/95  Pulse: 61 60 62 (!) 57  Resp: 17 16 13 18   Temp: 98.4 F (36.9 C)     TempSrc: Oral     SpO2: 95% 94% 91% 97%  Weight:      Height:         General exam: Moderately built and nourished patient, lying comfortably supine on the gurney in no obvious distress.  Head, eyes and ENT: Nontraumatic and normocephalic. Pupils equally reacting to light and accommodation. Oral mucosa moist.  Neck: Supple. No JVD, carotid bruit or thyromegaly.  Lymphatics: No lymphadenopathy.  Respiratory system: Clear to auscultation. No increased work of breathing.  Cardiovascular system: S1 and S2 heard, RRR. No JVD, murmurs, gallops, clicks or pedal edema.   Gastrointestinal system: Abdomen is nondistended, soft and nontender. Normal bowel sounds heard. No organomegaly or masses appreciated.  Central nervous system: Alert and oriented.  Mild left hemiparesis.  Ataxia.  Extremities: Symmetric 5 x 5 power. Peripheral pulses symmetrically felt.   Skin: No rashes or acute findings.  Musculoskeletal system: Negative exam.  Psychiatry: Pleasant and cooperative.  Labs on Admission:  Basic Metabolic Panel: Recent Labs  Lab 05/29/19 0456  NA 137  K 3.3*  CL 105  CO2 23  GLUCOSE 119*  BUN 11  CREATININE 0.85  CALCIUM 9.0   Liver Function Tests: Recent Labs  Lab 05/29/19 0456  AST 25  ALT 34  ALKPHOS 101  BILITOT 0.7  PROT 7.0  ALBUMIN 4.0   No results for  input(s): LIPASE, AMYLASE in the last 168 hours. No results for input(s): AMMONIA in the last 168 hours. CBC: Recent Labs  Lab 05/29/19 0456  WBC 9.2  NEUTROABS 5.1  HGB 14.4  HCT 43.4  MCV 92.7  PLT 362   Cardiac Enzymes: No results for input(s): CKTOTAL, CKMB, CKMBINDEX, TROPONINI in the last 168 hours.  BNP (last 3 results) No results for input(s): PROBNP in the last 8760 hours. CBG: No results for input(s): GLUCAP in the last 168 hours.  Radiological Exams on Admission: Ct Head Wo Contrast  Result Date: 05/29/2019 CLINICAL DATA:  Left-sided weakness. Awoke yesterday at 1100 hour with left-sided weakness. EXAM: CT HEAD WITHOUT CONTRAST TECHNIQUE: Contiguous axial images were obtained from the base of the skull through the vertex without intravenous contrast. COMPARISON:  None. FINDINGS: Brain: No intracranial hemorrhage, mass effect, or midline shift. Remote appearing lacunar infarct in the right basal ganglia. No evidence of acute ischemia. Age related atrophy. Mild periventricular white matter changes consistent with chronic small vessel ischemia. No hydrocephalus. The basilar cisterns are patent. No extra-axial or intracranial fluid collection. Vascular:  Atherosclerosis of skullbase vasculature without hyperdense vessel or abnormal calcification. Skull: No fracture or focal lesion. Sinuses/Orbits: Paranasal sinuses and mastoid air cells are clear. The visualized orbits are unremarkable. Other: None. IMPRESSION: 1. No acute intracranial abnormality. 2. Age related atrophy and chronic small vessel ischemia. Remote appearing lacunar infarct in the right basal ganglia. Electronically Signed   By: Keith Rake M.D.   On: 05/29/2019 05:30    EKG: Independently reviewed.  NSR  Assessment/Plan Principal Problem:   CVA (cerebral vascular accident) ([place]) Active Problems:   COPD, mild (HCC)   Severe persistent asthma, uncomplicated   GERD (gastroesophageal reflux disease)   Tobacco use   Seasonal and perennial allergic rhinitis   Hyperlipidemia   AAA (abdominal aortic aneurysm) without rupture ([place])   1. Acute strokelike symptoms suspect lacunar infarct-patient unfortunately reports that he has pieces of metal in his eyes and cannot have an MRI.  I canceled the MRI.  He will likely have to have another CT.  Continue full stroke work-up with echocardiogram, carotid Dopplers and continue aspirin 81 mg daily.  Neurology consultation requested.  Monitor on telemetry.  Check hemoglobin A1c and lipid panel. 2. GERD-Protonix for GI protection. 3. Hyperlipidemia-continue atorvastatin as ordered.  Check lipid panel. 4. Essential hypertension-allow permissive hypertension for 24 hours then plan to normalize blood pressures. 5. AAA-this is been stable continue outpatient follow-up and surveillance. 6. Tobacco-patient reports that he quit all tobacco several months ago.  Patient encouraged to continue cessation. 7. Allergic rhinitis and asthma-resume home bronchodilators and treatments.  DVT Prophylaxis: Lovenox Code Status: Full Family Communication: Patient updated at bedside, verbalized understanding Disposition Plan: Continue inpatient treatments   Time spent: 55 minutes  Clanford Wynetta Emery, MD [facility] How to contact the [facility] Attending or Consulting provider [place] or covering provider during after hours [place], for this patient?  1. Check the care team in [facility] and look for a) attending/consulting [facility] provider listed and b) the [facility] team listed 2. Log into [email or web address] and use [place]'s universal password to access. If you do not have the password, please contact the hospital operator. 3. Locate the [facility] provider you are looking for under [facility] and page to a number that you can be directly reached. 4. If you still have difficulty reaching the provider, please page the [facility] (Director on Call) for the Hospitalists listed on  amion for assistance.

## 2019-05-29 NOTE — Consult Note (Signed)
[facility] TeleNeurology Consult Services  Stat Consult  Date of Service:   05/29/2019 05:44:13  Impression:     .  Rule Out Acute Ischemic Stroke     .  Small vessel disease  Comments/Sign-Out: 68 yo M with left ataxia hemiparesis sparing face. Suspect lacunar stroke outside 24h window for alteplase or NIR intervention Recommend routine admission, start aspirin 81 mg daily, permissive HTN another 24h then goal BP normotensive <130/80.   MRI brain wo contrast, MRA head and neck, telemetry, TTE. Check lipid panel goal LDL <70 with atorvastatin titrate if needed. A1c goal euglycemic.    PT/OT/SLP rehab evals may be good acute rehab candidate if not fully ambulatory at discharge. VTE ppx .Stroke education.  Neurology follow up at discharge.  CT HEAD: Showed No Acute Hemorrhage or Acute Core Infarct Reviewed Remote R basal ganglia infarct  Metrics: [facility] Notification Time: 05/29/2019 05:42:44 Stamp Time: 05/29/2019 05:44:13 Callback Response Time: 05/29/2019 05:49:10 Video Start Time: 05/29/2019 05:54:56 Video End Time: 05/29/2019 06:08:17  Our recommendations are outlined below.  Recommendations:     .  Antiplatelet Therapy     .  Initiate Aspirin 81 MG Daily  Imaging Studies:     .  MRI Head     .  MRA Head and Neck Without Contrast When Available - Stroke Protocol     .  Echocardiogram - Transthoracic Echocardiogram  Therapies:     .  Physical Therapy, Occupational Therapy, Speech Therapy Assessment When Applicable  Other WorkUp:     .  Check B12 level     .  Check TSH  Disposition: Neurology Follow Up Recommended as Outpatient  Sign Out:     .  Discussed with Emergency Department Provider  ----------------------------------------------------------------------------------------------------  Chief Complaint: left sided weakness, ataxia  History of Present Illness: Patient is a Caleb Evans.  Caleb yo M hx AAA COPD asthma HLD  and tobacco abuse who had trouble walking this morning at 0400 and called EMS. He went to bed last normal at 0200 on 10/18, then woke up yesterday 10/18 morning at 11a and felt weak on the left leg. He could not get his balance and his left arm was weak.   This has never happened before. He used to smoke until a year ago. Takes a statin for cholesterol every night. Not on aspirin Has a tooth infection not on antibiotics yet but due to get a bridge cleaned out.   Past Medical History:     . Hyperlipidemia     . There is NO history of Stroke   Antiplatelet use: No Examination: BP(166/102), Pulse(61), Blood Glucose(119) 1A: Level of Consciousness - Alert; keenly responsive + 0 1B: Ask Month and Age - Both Questions Right + 0 1C: Blink Eyes & Squeeze Hands - Performs Both Tasks + 0 2: Test Horizontal Extraocular Movements - Normal + 0 3: Test Visual Fields - No Visual Loss + 0 4: Test Facial Palsy (Use Grimace if Obtunded) - Normal symmetry + 0 5A: Test Left Arm Motor Drift - Drift, but doesn't hit bed + 1 5B: Test Right Arm Motor Drift - No Drift for 10 Seconds + 0 6A: Test Left Leg Motor Drift - Drift, but doesn't hit bed + 1 6B: Test Right Leg Motor Drift - No Drift for 5 Seconds + 0 7: Test Limb Ataxia (FNF/Heel-Shin) - Ataxia in 2 Limbs + 2 8: Test Sensation - Normal; No sensory loss + 0 9: Test Language/Aphasia -  Normal; No aphasia + 0 10: Test Dysarthria - Normal + 0 11: Test Extinction/Inattention - No abnormality + 0  NIHSS Score: 4  Patient/Family was informed the Neurology Consult would happen via TeleHealth consult by way of interactive audio and video telecommunications and consented to receiving care in this manner.  Due to the immediate potential for life-threatening deterioration due to underlying acute neurologic illness, I spent 35 minutes providing critical care. This time includes time for face to face visit via telemedicine, review of medical records, imaging studies  and discussion of findings with providers, the patient and/or family.   Dr Leda Quail   [facility] [phone]   Case [id number]

## 2019-05-29 NOTE — Procedures (Signed)
ELECTROENCEPHALOGRAM REPORT   Patient: Caleb Evans       Room #: G715 EEG No. ID: [id number] Age: 68 y.o.        Sex: male Referring Physician: Wynetta Emery Report Date:  05/29/2019        Interpreting Physician: Alexis Goodell  History: GEOVANY TRUDO is an 68 y.o. male with left sided weakness and numbness  Medications:  Dulera, ASA, Lipitor  Conditions of Recording:  This is a 21 channel routine scalp EEG performed with bipolar and monopolar montages arranged in accordance to the international 10/20 system of electrode placement. One channel was dedicated to EKG recording.  The patient is in the awake, drowsy and asleep states.  Description:  The waking background activity consists of a low voltage, symmetrical, fairly well organized, 10 Hz alpha activity, seen from the parieto-occipital and posterior temporal regions.  Low voltage fast activity, poorly organized, is seen anteriorly and is at times superimposed on more posterior regions.  A mixture of theta and alpha rhythms are seen from the central and temporal regions. The patient drowses with slowing to irregular, low voltage theta and beta activity.   The patient goes in to a light sleep with symmetrical sleep spindles, vertex central sharp transients and irregular slow activity.   No epileptiform activity is noted.   Hyperventilation was not performed.  Intermittent photic stimulation was performed and elicits a symmetrical driving response but fails to elicit any abnormalities.  IMPRESSION: Normal electroencephalogram, awake, asleep and with activation procedures. There are no focal lateralizing or epileptiform features.   Alexis Goodell, MD Neurology [phone] 05/29/2019, 3:21 PM

## 2019-05-29 NOTE — ED Triage Notes (Signed)
Pt arrived via [facility] for left sided weakness. Went to bed at 0200 Sunday normal. Woke up at 1100 am Sunday with left sided weakness. Came in how because he had trouble walking due to the weakness.  Patient is alert and oriented x4   18g R ac by ems

## 2019-05-29 NOTE — ED Provider Notes (Signed)
[facility] EMERGENCY DEPARTMENT Provider Note   CSN: [id number] Arrival date & time: 05/29/19  0445     History   Chief Complaint Chief Complaint  Patient presents with  . Weakness    HPI Caleb Evans is a 68 y.o. male.     Patient presents to the emergency department from home by ambulance for suspected stroke.  Patient reports that he woke up yesterday at 11 AM (18 hours ago) and noted that his left arm and left leg were numb, weak and clumsy.  He went to bed at 2 AM the night before (therefore last normal is 27 hours ago).  Patient denies headache and visual disturbance.  He has not had any difficulty with speech.  He is right-handed.  He called an ambulance this morning because he tried to get up and was having severe difficulty walking because of the left leg weakness.     Past Medical History:  Diagnosis Date  . AAA (abdominal aortic aneurysm) ([place])   . Adrenal nodule ([place])   . Asthma   . Bladder mass   . Bladder neoplasm   . Cancer [facility])    bladder cancer  . COPD (chronic obstructive pulmonary disease) ([place])   . Crohn's disease ([place])   . Diverticulitis   . GERD (gastroesophageal reflux disease)    takes Nexium off and on  . Over weight   . Skin lesion   . Thoracic aortic aneurysm ([place])   . Weak urinary stream     Patient Active Problem List   Diagnosis Date Noted  . Sigmoid diverticulitis 01/18/2018  . Aneurysm of thoracic aorta ([place]) 11/22/2017  . Hyperlipidemia 11/22/2017  . AAA (abdominal aortic aneurysm) without rupture ([place]) 11/22/2017  . Seasonal and perennial allergic rhinitis 04/19/2017  . Severe persistent asthma, uncomplicated 47/65/4650  . GERD (gastroesophageal reflux disease) 04/08/2016  . Tobacco use 04/08/2016  . Chronic nonseasonal allergic rhinitis due to fungal spores 01/17/2014  . COPD, mild ([place]) 01/17/2014  . Mild chronic obstructive pulmonary disease ([place]) 01/17/2014    Past Surgical History:  Procedure Laterality Date   . APPENDECTOMY    . CHOLECYSTECTOMY    . LAPAROSCOPIC PARTIAL COLECTOMY  01/18/2018   ERAS PATHWAY  . LAPAROSCOPIC PARTIAL COLECTOMY N/A 01/18/2018   Procedure: LAPAROSCOPIC PARTIAL COLECTOMY ERAS PATHWAY;  Surgeon: Stark Klein, MD;  Location: [place];  Service: General;  Laterality: N/A;  . TONSILLECTOMY          Home Medications    Prior to Admission medications   Medication Sig Start Date End Date Taking? Authorizing Provider  atorvastatin (LIPITOR) 80 MG tablet Take 80 mg by mouth at bedtime.    [provider]  azelastine (ASTELIN) 0.1 % nasal spray Place 2 sprays into both nostrils 2 (two) times daily as needed for up to 30 days for rhinitis. Use in each nostril as directed 01/04/19 02/03/19  Valentina Shaggy, MD  benzonatate (TESSALON) 100 MG capsule Take by mouth 2 (two) times daily as needed for cough.    [provider]  Calcium Carb-Cholecalciferol (CALCIUM 600+D3 PO) Take 1 tablet by mouth daily.    [provider]  fluticasone (FLONASE) 50 MCG/ACT nasal spray INHALE 2 SPRAYS IN EACH NOSTRIL DAILY. 04/05/19   Valentina Shaggy, MD  Fluticasone Propionate Truett Perna) 93 MCG/ACT EXHU Place 2 puffs into the nose 2 (two) times daily. 05/03/19   Valentina Shaggy, MD  Mepolizumab (NUCALA) 100 MG/ML SOAJ Inject into the skin.  [provider]  mometasone-formoterol (DULERA) 100-5 MCG/ACT AERO Inhale 2 puffs into the lungs 2 (two) times daily.    [provider]  Multiple Vitamin (MULTIVITAMIN WITH MINERALS) TABS tablet Take 1 tablet by mouth daily.    [provider]  NON FORMULARY 1 Dose by Subconjunctival route every Tuesday. ALLERGY SHOTS    [provider]  Olopatadine HCl (PATADAY) 0.2 % SOLN Place 1 drop into both eyes daily as needed (for irritated/allergy eyes.). Patient not taking: Reported on 01/30/2019 02/17/18   Valentina Shaggy, MD  PROAIR HFA 108 [phone] Base) MCG/ACT inhaler INHALE 2 PUFFS EVERY 4  TO 6 HOURS AS NEEDED. Patient not taking: Reported on 01/30/2019 04/12/18   Valentina Shaggy, MD    Family History Family History  Adopted: Yes  Problem Relation Age of Onset  . Bladder Cancer Neg Hx   . Kidney cancer Neg Hx   . Prostate cancer Neg Hx   . Allergic rhinitis Neg Hx   . Angioedema Neg Hx   . Asthma Neg Hx   . Eczema Neg Hx   . Urticaria Neg Hx   . Immunodeficiency Neg Hx     Social History Social History   Tobacco Use  . Smoking status: Former Smoker    Packs/day: 3.00    Years: 50.00    Pack years: 150.00    Types: Cigarettes    Quit date: 04/10/2018    Years since quitting: 1.1  . Smokeless tobacco: Never Used  . Tobacco comment: quit two weeks ago  Substance Use Topics  . Alcohol use: Yes    Alcohol/week: 0.0 standard drinks    Comment: occasional  . Drug use: No     Allergies   Chlorpheniramine and Amoxicillin-pot clavulanate   Review of Systems Review of Systems  Neurological: Positive for weakness and numbness.  All other systems reviewed and are negative.    Physical Exam Updated Vital Signs BP (!) 166/102   Pulse 61   Temp 98.4 F (36.9 C) (Oral)   Resp 17   Ht 5' 7"  (1.702 m)   Wt 79.4 kg   SpO2 95%   BMI 27.41 kg/m   Physical Exam Vitals signs and nursing note reviewed.  Constitutional:      General: He is not in acute distress.    Appearance: Normal appearance. He is well-developed.  HENT:     Head: Normocephalic and atraumatic.     Right Ear: Hearing normal.     Left Ear: Hearing normal.     Nose: Nose normal.  Eyes:     Conjunctiva/sclera: Conjunctivae normal.     Pupils: Pupils are equal, round, and reactive to light.  Neck:     Musculoskeletal: Normal range of motion and neck supple.  Cardiovascular:     Rate and Rhythm: Regular rhythm.     Heart sounds: S1 normal and S2 normal. No murmur. No friction rub. No gallop.   Pulmonary:     Effort: Pulmonary effort is normal. No respiratory distress.     Breath  sounds: Normal breath sounds.  Chest:     Chest wall: No tenderness.  Abdominal:     General: Bowel sounds are normal.     Palpations: Abdomen is soft.     Tenderness: There is no abdominal tenderness. There is no guarding or rebound. Negative signs include Murphy's sign and McBurney's sign.     Hernia: No hernia is present.  Musculoskeletal: Normal range of motion.  Skin:  General: Skin is warm and dry.     Findings: No rash.  Neurological:     Mental Status: He is alert and oriented to person, place, and time.     GCS: GCS eye subscore is 4. GCS verbal subscore is 5. GCS motor subscore is 6.     Cranial Nerves: No cranial nerve deficit.     Sensory: Sensory deficit (left hemiparesis) present.     Motor: Weakness (3/5 LUE, LLE) present.     Coordination: Coordination abnormal (LUE, LLE). Finger-Nose-Finger Test abnormal (left).     Gait: Gait abnormal.     Comments: Pronator drift LUE  Psychiatric:        Speech: Speech normal.        Behavior: Behavior normal.        Thought Content: Thought content normal.      ED Treatments / Results  Labs (all labs ordered are listed, but only abnormal results are displayed) Labs Reviewed  APTT - Abnormal; Notable for the following components:      Result Value   aPTT 37 (*)    All other components within normal limits  COMPREHENSIVE METABOLIC PANEL - Abnormal; Notable for the following components:   Potassium 3.3 (*)    Glucose, Bld 119 (*)    All other components within normal limits  SARS CORONAVIRUS 2 (TAT 6-24 HRS)  ETHANOL  PROTIME-INR  CBC  DIFFERENTIAL  RAPID URINE DRUG SCREEN, HOSP PERFORMED  URINALYSIS, ROUTINE W REFLEX MICROSCOPIC    EKG EKG Interpretation  Date/Time:  Monday May 29 2019 04:55:43 EDT Ventricular Rate:  62 PR Interval:    QRS Duration: 118 QT Interval:  432 QTC Calculation: 439 R Axis:   54 Text Interpretation:  Sinus rhythm Nonspecific intraventricular conduction delay Baseline wander  in lead(s) II No significant change since last tracing Confirmed by Orpah Greek [phone]) on 05/29/2019 5:31:02 AM   Radiology Ct Head Wo Contrast  Result Date: 05/29/2019 CLINICAL DATA:  Left-sided weakness. Awoke yesterday at 1100 hour with left-sided weakness. EXAM: CT HEAD WITHOUT CONTRAST TECHNIQUE: Contiguous axial images were obtained from the base of the skull through the vertex without intravenous contrast. COMPARISON:  None. FINDINGS: Brain: No intracranial hemorrhage, mass effect, or midline shift. Remote appearing lacunar infarct in the right basal ganglia. No evidence of acute ischemia. Age related atrophy. Mild periventricular white matter changes consistent with chronic small vessel ischemia. No hydrocephalus. The basilar cisterns are patent. No extra-axial or intracranial fluid collection. Vascular: Atherosclerosis of skullbase vasculature without hyperdense vessel or abnormal calcification. Skull: No fracture or focal lesion. Sinuses/Orbits: Paranasal sinuses and mastoid air cells are clear. The visualized orbits are unremarkable. Other: None. IMPRESSION: 1. No acute intracranial abnormality. 2. Age related atrophy and chronic small vessel ischemia. Remote appearing lacunar infarct in the right basal ganglia. Electronically Signed   By: Keith Rake M.D.   On: 05/29/2019 05:30    Procedures Procedures (including critical care time)  Medications Ordered in ED Medications  aspirin tablet 325 mg (has no administration in time range)     Initial Impression / Assessment and Plan / ED Course  I have reviewed the triage vital signs and the nursing notes.  Pertinent labs & imaging results that were available during my care of the patient were reviewed by me and considered in my medical decision making (see chart for details).        Patient presents to the emergency department with stroke symptoms.  Last normal was  27 hours ago.  Patient is therefore out of the  window for IV as well as directed IR thrombolytics or neuro intervention.  CT scan performed at arrival shows right basal ganglia infarct, unclear age.  No acute bleed.  Teleneurology consultation performed, agreed with admission for stroke work-up including MRI, MRA, echo, carotid ultrasound.  Aspirin 325 now then low-dose aspirin daily.  Final Clinical Impressions(s) / ED Diagnoses   Final diagnoses:  Cerebrovascular accident (CVA), unspecified mechanism [facility])    ED Discharge Orders    None       Poll[place], Gwenyth Allegra, MD 05/29/19 [phone]

## 2019-05-29 NOTE — Evaluation (Signed)
Physical Therapy Evaluation Patient Details Name: Caleb Evans MRN: [id number] DOB: 1951-01-02 Today's Date: 05/29/2019   History of Present Illness  Caleb Evans is a 68 y.o. male former heavy smoker quit several months ago, COPD, asthma, AAA, history of bladder cancer, Crohn's disease, GERD who presented to the emergency department by EMS with concerns for CVA.  Patient says he woke up yesterday around 11 AM having symptoms of left arm and left leg numbness and weakness.  He was having some ataxia as well.  He said that he had been normal the day prior.  He denies any speech impediments.  His main complaint was the difficulty with ambulating.  He denies changes in vision.  He denies headache.  He denies shortness of breath and chest pain.  He reports that the symptoms are new to him.    Clinical Impression  Patient limited for functional mobility as stated below secondary to LLE weakness, fatigue and poor standing balance. Patient requires assist as stated below. When first attempting from sit to stand, patient's LLE gives out secondary to weakness but he is able to complete transfer with RW for UE support. Patient able to ambulate with RW and occasionally catches LLE on floor/RLE when attempting to turn or increase gait velocity. Patient requires minimal verbal cueing to stay inside/ closer to walker to reduce the risk of fall. Patient initially attempts to use LLE when ascending/decending stairs but experiences difficulty/ loss of balance secondary to LLE weakness. He is able to navigate when using RLE. Patient is extremely motivated to regain prior level of function and appears to have good tolerance to activity.  Patient will benefit from continued physical therapy in hospital and recommended venue below to increase strength, balance, endurance for safe ADLs and gait.     Follow Up Recommendations CIR    Equipment Recommendations  Rolling walker with 5" wheels     Recommendations for Other Services       Precautions / Restrictions Precautions Precautions: Fall Restrictions Weight Bearing Restrictions: No      Mobility  Bed Mobility Overal bed mobility: Modified Independent             General bed mobility comments: slow, labored  Transfers Overall transfer level: Needs assistance Equipment used: Rolling walker (2 wheeled);None Transfers: Sit to/from Stand Sit to Stand: Modified independent (Device/Increase time);Min assist         General transfer comment: LLE gives out on initial sit to stand without AD secondary to weakness, improves with use of UW  Ambulation/Gait Ambulation/Gait assistance: Modified independent (Device/Increase time);Min guard Gait Distance (Feet): 100 Feet Assistive device: Rolling walker (2 wheeled) Gait Pattern/deviations: Step-through pattern;Decreased step length - right;Decreased step length - left;Decreased stride length Gait velocity: decreased   General Gait Details: slow, catches LLE several times when completing turns or tries to ambulate with increased velocity using RW, frequent verbal cues to stay inside RW  Stairs Stairs: Yes Stairs assistance: Modified independent (Device/Increase time);Min assist Stair Management: One rail Right Number of Stairs: 4 General stair comments: patient attempts to use LLE to ascent/decend on intial trial and experiences stumbling, able to navigate without loss of balance when relying on RLE  Wheelchair Mobility    Modified Rankin (Stroke Patients Only)       Balance Overall balance assessment: Modified Independent;Needs assistance Sitting-balance support: Feet supported;No upper extremity supported Sitting balance-Leahy Scale: Normal Sitting balance - Comments: at bedside   Standing balance support: No upper extremity supported Standing balance-Leahy  Scale: Fair Standing balance comment: improves using RW                              Pertinent Vitals/Pain Pain Assessment: No/denies pain    Home Living Family/patient expects to be discharged to:: Private residence Living Arrangements: Spouse/significant other Available Help at Discharge: Family Type of Home: House Home Access: Stairs to enter Entrance Stairs-Rails: Left Entrance Stairs-Number of Steps: 3 Home Layout: One level Home Equipment: Cane - single point      Prior Function Level of Independence: Independent   Gait / Transfers Assistance Needed: patient reports community ambulation  ADL's / Homemaking Assistance Needed: reports no assit        Hand Dominance        Extremity/Trunk Assessment   Upper Extremity Assessment Upper Extremity Assessment: Defer to OT evaluation;LUE deficits/detail LUE Deficits / Details: weakness througout LUE, C5-T1 myotomes 3+ to 4    Lower Extremity Assessment Lower Extremity Assessment: LLE deficits/detail LLE Deficits / Details: weakness throughout LLE, L2-L5 myotomes 3+ to 4 LLE Sensation: WNL       Communication   Communication: No difficulties  Cognition Arousal/Alertness: Awake/alert Behavior During Therapy: WFL for tasks assessed/performed Overall Cognitive Status: Within Functional Limits for tasks assessed                                        General Comments      Exercises     Assessment/Plan    PT Assessment Patient needs continued PT services  PT Problem List Decreased strength;Decreased activity tolerance;Decreased balance;Decreased mobility;Decreased safety awareness       PT Treatment Interventions DME instruction;Gait training;Stair training;Functional mobility training;Therapeutic activities;Therapeutic exercise;Balance training;Neuromuscular re-education;Patient/family education    PT Goals (Current goals can be found in the Care Plan section)  Acute Rehab PT Goals Patient Stated Goal: Walk normal and return home PT Goal Formulation: With  patient Time For Goal Achievement: 06/12/19 Potential to Achieve Goals: Fair    Frequency 7X/week   Barriers to discharge        Co-evaluation               AM-PAC PT "6 Clicks" Mobility  Outcome Measure Help needed turning from your back to your side while in a flat bed without using bedrails?: None Help needed moving from lying on your back to sitting on the side of a flat bed without using bedrails?: None Help needed moving to and from a bed to a chair (including a wheelchair)?: A Little Help needed standing up from a chair using your arms (e.g., wheelchair or bedside chair)?: A Little Help needed to walk in hospital room?: A Little Help needed climbing 3-5 steps with a railing? : A Little 6 Click Score: 20    End of Session Equipment Utilized During Treatment: Gait belt Activity Tolerance: Patient tolerated treatment well Patient left: in bed;with call bell/phone within reach;with family/visitor present Nurse Communication: Mobility status PT Visit Diagnosis: Unsteadiness on feet (R26.81);Other abnormalities of gait and mobility (R26.89);Muscle weakness (generalized) (M62.81);Other symptoms and signs involving the nervous system (V89.381)    Time: 0175-1025 PT Time Calculation (min) (ACUTE ONLY): 29 min   Charges:   PT Evaluation $PT Eval Moderate Complexity: 1 Mod PT Treatments $Therapeutic Activity: 23-37 mins        4:30 PM, 05/29/19 Mearl Latin PT,  DPT Physical Therapist at [facility]

## 2019-05-29 NOTE — Progress Notes (Signed)
SLP Cancellation Note  Patient Details Name: Caleb Evans MRN: [id number] DOB: 03/04/1951   Cancelled treatment:       Reason Eval/Treat Not Completed: SLP screened, no needs identified, will sign off; SLP screened Pt in room. Pt denies any changes in swallowing, speech, language, or cognition. Pt reports continued changes in his left upper extremity. CT negative for acute changes. SLE will be deferred at this time. Reconsult if indicated. SLP will sign off.  Thank you,  Genene Churn, [place]   [place] 05/29/2019, 1:41 PM

## 2019-05-29 NOTE — Progress Notes (Signed)
*  PRELIMINARY RESULTS* Echocardiogram 2D Echocardiogram has been performed.  Caleb Evans 05/29/2019, 2:49 PM

## 2019-05-29 NOTE — Consult Note (Signed)
[place] A. Merlene Laughter, MD     [email or web address]          Caleb Evans is an 68 y.o. male.   ASSESSMENT/PLAN: 1.  Acute onset of left hemiparesis and gait impairment: The patient most likely has had an acute ischemic stroke involving the contralateral hemisphere.  Potential localization includes right frontal subcortical convexity, internal capsule, pontine region, basal ganglia and cerebral peduncle of the midbrain region.  Risk factors include his age and dyslipidemia.  Additional work-up includes CTA of the intracranial vessels and lipid panel along with hemoglobin A1c.  The patient should be maintained on aspirin.  Statin is also recommended. 3.  Suggestion of neuropathy on examination.  Additional labs are suggested.    Patient is a 68 year old handed white male who presents with acute onset of gait instability and left-sided weakness.  Patient presented OTTW- outside the thrombolysis thrombectomy window and therefore was not considered for these interventions. The patient apparently awoke on Sunday morning and felt unsteady.  This persisted throughout the day.  He woke up this morning and noticed that his left eye was weak particularly the left upper extremity.  Patient does not report having dysarthria, dysphagia or double vision.  He does not report having symptoms on the right side.  He denies any dyspnea, palpitation or chest pain.  Review of systems otherwise negative.  GENERAL: This is a pleasant thin male in no acute distress.  HEENT: No trauma appreciated.  Neck is supple.  ABDOMEN: soft  EXTREMITIES: No edema   BACK: Normal  SKIN: Normal by inspection.    MENTAL STATUS: Alert and oriented -including his name and the month. Speech, language and cognition are generally intact. Judgment and insight normal.   CRANIAL NERVES: Pupils are equal, round and reactive to light and accomodation; extra ocular movements are full, there is no significant  nystagmus; visual fields are full; upper and lower facial muscles are normal in strength and symmetric, there is no flattening of the nasolabial folds; tongue is midline; uvula is midline; shoulder elevation is normal.  MOTOR: The patient has a left hemiparesis with the left upper extremity being more affected than the left lower extremity.  Left deltoid, triceps and hand grip 4/5.  Wrist extension is 3/5.  The patient does have markedly impaired finger movements and hand dexterity on the left.  Left hip flexion is 5 and dorsiflexion 4+.  There is profound drift of the left upper extremity and the mild drift of the left leg (2,1).  The tone is slightly reduced on the left side.  The right side shows normal tone, bulk and strength.  There is no drift on the right side.  COORDINATION: There is mild dysmetria of the left upper extremity and left leg but this seems appropriate for the associated weakness.  No rest tremor; no intention tremor; no postural tremor; no bradykinesia.  REFLEXES: Deep tendon reflexes are symmetrical and overall reduced throughout.  Responses are even more reduced on the left side especially left lower extremity requiring augmentation.  Plantar responses are downgoing on the right and upgoing on the left.    SENSATION: Normal to light touch, temperature, and pain.  No extinction on double simultaneous stimulation.  NIH stroke scale 2, 1 total 3.   Blood pressure (!) 155/91, pulse 67, temperature 97.9 F (36.6 C), temperature source Oral, resp. rate 15, height 5' 7"  (1.702 m), weight 79.4 kg, SpO2 95 %.  Past Medical History:  Diagnosis Date  .  AAA (abdominal aortic aneurysm) ([place])   . Adrenal nodule ([place])   . Asthma   . Bladder mass   . Bladder neoplasm   . Cancer [facility])    bladder cancer  . COPD (chronic obstructive pulmonary disease) ([place])   . Crohn's disease ([place])   . Diverticulitis   . GERD (gastroesophageal reflux disease)    takes Nexium off and on  . Over  weight   . Skin lesion   . Thoracic aortic aneurysm ([place])   . Weak urinary stream     Past Surgical History:  Procedure Laterality Date  . APPENDECTOMY    . CHOLECYSTECTOMY    . LAPAROSCOPIC PARTIAL COLECTOMY  01/18/2018   ERAS PATHWAY  . LAPAROSCOPIC PARTIAL COLECTOMY N/A 01/18/2018   Procedure: LAPAROSCOPIC PARTIAL COLECTOMY ERAS PATHWAY;  Surgeon: Stark Klein, MD;  Location: [facility];  Service: General;  Laterality: N/A;  . TONSILLECTOMY      Family History  Adopted: Yes  Problem Relation Age of Onset  . Bladder Cancer Neg Hx   . Kidney cancer Neg Hx   . Prostate cancer Neg Hx   . Allergic rhinitis Neg Hx   . Angioedema Neg Hx   . Asthma Neg Hx   . Eczema Neg Hx   . Urticaria Neg Hx   . Immunodeficiency Neg Hx     Social History:  reports that he quit smoking about 13 months ago. His smoking use included cigarettes. He has a 150.00 pack-year smoking history. He has never used smokeless tobacco. He reports current alcohol use. He reports that he does not use drugs.  Allergies:  Allergies  Allergen Reactions  . Chlorpheniramine Other (See Comments)    Sinus congestion  . Amoxicillin-Pot Clavulanate Other (See Comments)    UNSPECIFIED REACTION  Has patient had a PCN reaction causing immediate rash, facial/tongue/throat swelling, SOB or lightheadedness with hypotension: No Has patient had a PCN reaction causing severe rash involving mucus membranes or skin necrosis: No Has patient had a PCN reaction that required hospitalization: No Has patient had a PCN reaction occurring within the last 10 years: Yes If all of the above answers are "NO", then may proceed with Cephalosporin use.  Caused liver failure     Medications: Prior to Admission medications   Medication Sig Start Date End Date Taking? Authorizing Provider  atorvastatin (LIPITOR) 80 MG tablet Take 80 mg by mouth at bedtime.   Yes [provider]  azelastine (ASTELIN) 0.1 % nasal spray Place 2 sprays  into both nostrils 2 (two) times daily as needed for up to 30 days for rhinitis. Use in each nostril as directed 01/04/19 05/29/19 Yes Valentina Shaggy, MD  Calcium Carb-Cholecalciferol (CALCIUM 600+D3 PO) Take 1 tablet by mouth daily.   Yes [provider]  Fluticasone Propionate (XHANCE) 93 MCG/ACT EXHU Place 2 puffs into the nose 2 (two) times daily. 05/03/19  Yes Valentina Shaggy, MD  Mepolizumab (NUCALA) 100 MG/ML SOAJ Inject into the skin.   Yes [provider]  mometasone-formoterol (DULERA) 100-5 MCG/ACT AERO Inhale 2 puffs into the lungs 2 (two) times daily.   Yes [provider]  Multiple Vitamin (MULTIVITAMIN WITH MINERALS) TABS tablet Take 1 tablet by mouth daily.   Yes [provider]  PROAIR HFA 108 (90 Base) MCG/ACT inhaler INHALE 2 PUFFS EVERY 4 TO 6 HOURS AS NEEDED. 04/12/18  Yes Valentina Shaggy, MD  fluticasone (FLONASE) 50 MCG/ACT nasal spray INHALE 2 SPRAYS IN EACH NOSTRIL DAILY. Patient  not taking: Reported on 05/29/2019 04/05/19   Valentina Shaggy, MD  Olopatadine HCl (PATADAY) 0.2 % SOLN Place 1 drop into both eyes daily as needed (for irritated/allergy eyes.). Patient not taking: Reported on 01/30/2019 02/17/18   Valentina Shaggy, MD    Scheduled Meds: . Derrill Memo ON 05/30/2019] aspirin EC  81 mg Oral Daily  . atorvastatin  80 mg Oral QHS  . enoxaparin (LOVENOX) injection  40 mg Subcutaneous Q24H  . mometasone-formoterol  2 puff Inhalation BID   Continuous Infusions: . sodium chloride 60 mL/hr at 05/29/19 1018   PRN Meds:.acetaminophen **OR** acetaminophen (TYLENOL) oral liquid 160 mg/5 mL **OR** acetaminophen, senna-docusate     Results for orders placed or performed during the hospital encounter of 05/29/19 (from the past 48 hour(s))  Ethanol     Status: None   Collection Time: 05/29/19  4:56 AM  Result Value Ref Range   Alcohol, Ethyl (B) <10 <10 mg/dL    Comment: (NOTE) Lowest detectable limit for serum  alcohol is 10 mg/dL. For medical purposes only. Performed at [facility], [address], [place], [place] [address]   Protime-INR     Status: None   Collection Time: 05/29/19  4:56 AM  Result Value Ref Range   Prothrombin Time 13.1 11.4 - 15.2 seconds   INR 1.0 0.8 - 1.2    Comment: (NOTE) INR goal varies based on device and disease states. Performed at [facility], [address], [place], [place] [address]   APTT     Status: Abnormal   Collection Time: 05/29/19  4:56 AM  Result Value Ref Range   aPTT 37 (H) 24 - 36 seconds    Comment:        IF BASELINE aPTT IS ELEVATED, SUGGEST PATIENT RISK ASSESSMENT BE USED TO DETERMINE APPROPRIATE ANTICOAGULANT THERAPY. Performed at [facility], [address], [place], [place] [address]   CBC     Status: None   Collection Time: 05/29/19  4:56 AM  Result Value Ref Range   WBC 9.2 4.0 - 10.5 K/uL   RBC 4.68 4.22 - 5.81 MIL/uL   Hemoglobin 14.4 13.0 - 17.0 g/dL   HCT 43.4 39.0 - 52.0 %   MCV 92.7 80.0 - 100.0 fL   MCH 30.8 26.0 - 34.0 pg   MCHC 33.2 30.0 - 36.0 g/dL   RDW 14.1 11.5 - 15.5 %   Platelets 362 150 - 400 K/uL   nRBC 0.0 0.0 - 0.2 %    Comment: Performed at [facility], [address], [place], [place] [address]  Differential     Status: None   Collection Time: 05/29/19  4:56 AM  Result Value Ref Range   Neutrophils Relative % 55 %   Neutro Abs 5.1 1.7 - 7.7 K/uL   Lymphocytes Relative 37 %   Lymphs Abs 3.4 0.7 - 4.0 K/uL   Monocytes Relative 6 %   Monocytes Absolute 0.5 0.1 - 1.0 K/uL   Eosinophils Relative 1 %   Eosinophils Absolute 0.1 0.0 - 0.5 K/uL   Basophils Relative 1 %   Basophils Absolute 0.1 0.0 - 0.1 K/uL   Immature Granulocytes 0 %   Abs Immature Granulocytes 0.02 0.00 - 0.07 K/uL    Comment: Performed at [facility], [address] [place], [place] [address]  Comprehensive metabolic panel     Status: Abnormal   Collection Time: 05/29/19  4:56 AM  Result Value Ref Range   Sodium 137 135 - 145  mmol/L   Potassium 3.3 (L) 3.5 - 5.1 mmol/L   Chloride 105 98 - 111 mmol/L   CO2 23 22 - 32 mmol/L   Glucose, Bld 119 (H) 70 - 99 mg/dL   BUN 11 8 - 23 mg/dL   Creatinine, Ser 0.85 0.61 - 1.24 mg/dL   Calcium 9.0 8.9 - 10.3 mg/dL   Total Protein 7.0 6.5 - 8.1 g/dL   Albumin 4.0 3.5 - 5.0 g/dL   AST 25 15 - 41 U/L   ALT 34 0 - 44 U/L   Alkaline Phosphatase 101 38 - 126 U/L   Total Bilirubin 0.7 0.3 - 1.2 mg/dL   GFR calc non Af Amer >60 >60 mL/min   GFR calc Af Amer >60 >60 mL/min   Anion gap 9 5 - 15    Comment: Performed at [facility], [address], [place], [place] [address]  Urine rapid drug screen (hosp performed)     Status: None   Collection Time: 05/29/19  4:56 AM  Result Value Ref Range   Opiates NONE DETECTED NONE DETECTED   Cocaine NONE DETECTED NONE DETECTED   Benzodiazepines NONE DETECTED NONE DETECTED   Amphetamines NONE DETECTED NONE DETECTED   Tetrahydrocannabinol NONE DETECTED NONE DETECTED   Barbiturates NONE DETECTED NONE DETECTED    Comment: (NOTE) DRUG SCREEN FOR MEDICAL PURPOSES ONLY.  IF CONFIRMATION IS NEEDED FOR ANY PURPOSE, NOTIFY LAB WITHIN 5 DAYS. LOWEST DETECTABLE LIMITS FOR URINE DRUG SCREEN Drug Class                     Cutoff (ng/mL) Amphetamine and metabolites    1000 Barbiturate and metabolites    200 Benzodiazepine                 660 Tricyclics and metabolites     300 Opiates and metabolites        300 Cocaine and metabolites        300 THC                            50 Performed at [facility], [address], [place], [place] [address]   Urinalysis, Routine w reflex microscopic     Status: Abnormal   Collection Time: 05/29/19  4:56 AM  Result Value Ref Range   Color, Urine STRAW (A) YELLOW   APPearance CLEAR CLEAR   Specific Gravity, Urine 1.006 1.005 - 1.030   pH 5.0 5.0 - 8.0   Glucose, UA NEGATIVE NEGATIVE mg/dL   Hgb urine dipstick NEGATIVE NEGATIVE   Bilirubin Urine NEGATIVE NEGATIVE   Ketones, ur NEGATIVE NEGATIVE  mg/dL   Protein, ur NEGATIVE NEGATIVE mg/dL   Nitrite NEGATIVE NEGATIVE   Leukocytes,Ua NEGATIVE NEGATIVE    Comment: Performed at [facility], [address], [place], [place] [address]  Hemoglobin A1c     Status: Abnormal   Collection Time: 05/29/19  4:56 AM  Result Value Ref Range   Hgb A1c MFr Bld 6.1 (H) 4.8 - 5.6 %    Comment: (NOTE) Pre diabetes:          5.7%-6.4% Diabetes:              >6.4% Glycemic control for   <7.0% adults with diabetes    Mean Plasma Glucose 128.37 mg/dL    Comment: Performed at [facility] [address] [place], [place] [address]  Lipid panel  Status: Abnormal   Collection Time: 05/29/19  4:56 AM  Result Value Ref Range   Cholesterol 135 0 - 200 mg/dL   Triglycerides 259 (H) <150 mg/dL   HDL 33 (L) >40 mg/dL   Total CHOL/HDL Ratio 4.1 RATIO   VLDL 52 (H) 0 - 40 mg/dL   LDL Cholesterol 50 0 - 99 mg/dL    Comment:        Total Cholesterol/HDL:CHD Risk Coronary Heart Disease Risk Table                     Men   Women  1/2 Average Risk   3.4   3.3  Average Risk       5.0   4.4  2 X Average Risk   9.6   7.1  3 X Average Risk  23.4   11.0        Use the calculated Patient Ratio above and the CHD Risk Table to determine the patient's CHD Risk.        ATP III CLASSIFICATION (LDL):  <100     mg/dL   Optimal  100-129  mg/dL   Near or Above                    Optimal  130-159  mg/dL   Borderline  160-189  mg/dL   High  >190     mg/dL   Very High Performed at [facility], [address], [place], [place] [address]   TSH     Status: None   Collection Time: 05/29/19  4:56 AM  Result Value Ref Range   TSH 2.432 0.350 - 4.500 uIU/mL    Comment: Performed by a 3rd Generation assay with a functional sensitivity of <=0.01 uIU/mL. Performed at [facility], [address] [place], [place] [address]   Vitamin B12     Status: None   Collection Time: 05/29/19  4:56 AM  Result Value Ref Range   Vitamin B-12 457 180 - 914 pg/mL     Comment: (NOTE) This assay is not validated for testing neonatal or myeloproliferative syndrome specimens for Vitamin B12 levels. Performed at [facility], [address] [place], [place] [address]   HIV Antibody (routine testing w rflx)     Status: None   Collection Time: 05/29/19 11:02 AM  Result Value Ref Range   HIV Screen 4th Generation wRfx NON REACTIVE NON REACTIVE    Comment: Performed at [facility] [address] [place], [place] [address]  Glucose, capillary     Status: Abnormal   Collection Time: 05/29/19  4:06 PM  Result Value Ref Range   Glucose-Capillary 103 (H) 70 - 99 mg/dL   Comment 1 Notify RN    Comment 2 Document in Chart     Studies/Results:   EEG IMPRESSION: Normal electroencephalogram, awake, asleep and with activation procedures. There are no focal lateralizing or epileptiform features.     CAROTID  IMPRESSION: Minimal amount of bilateral atherosclerotic plaque, not resulting in a hemodynamically significant stenosis within either internal carotid artery.     HEAD CT FINDINGS: Brain: No intracranial hemorrhage, mass effect, or midline shift. Remote appearing lacunar infarct in the right basal ganglia. No evidence of acute ischemia. Age related atrophy. Mild periventricular white matter changes consistent with chronic small vessel ischemia. No hydrocephalus. The basilar cisterns are patent. No extra-axial or intracranial fluid collection.  Vascular: Atherosclerosis of skullbase vasculature without hyperdense vessel or abnormal calcification.  Skull: No fracture or focal lesion.  Sinuses/Orbits: Paranasal sinuses and mastoid air cells are clear. The visualized orbits are unremarkable.  Other: None.  IMPRESSION: 1. No acute intracranial abnormality. 2. Age related atrophy and chronic small vessel ischemia. Remote appearing lacunar infarct in the right basal ganglia.     The brain MRI is reviewed in person.  There  is minimal chronic white matter changes.  There is evidence of a small remote lacunar infarct involving the right putamen.  There is also similar finding involving the left centrum semiovale abutting the lateral ventricle.  No acute findings are noted.   Nuriyah Hanline A. Merlene Laughter, M.D.  Diplomate, Tax adviser of Psychiatry and Neurology ( Neurology). 05/29/2019, 5:45 PM

## 2019-05-30 ENCOUNTER — Inpatient Hospital Stay (HOSPITAL_COMMUNITY): Payer: Medicare Other

## 2019-05-30 DIAGNOSIS — I714 Abdominal aortic aneurysm, without rupture: Secondary | ICD-10-CM | POA: Diagnosis not present

## 2019-05-30 DIAGNOSIS — K219 Gastro-esophageal reflux disease without esophagitis: Secondary | ICD-10-CM | POA: Diagnosis not present

## 2019-05-30 DIAGNOSIS — J3089 Other allergic rhinitis: Secondary | ICD-10-CM

## 2019-05-30 DIAGNOSIS — Z87891 Personal history of nicotine dependence: Secondary | ICD-10-CM | POA: Diagnosis not present

## 2019-05-30 DIAGNOSIS — Z720 Tobacco use: Secondary | ICD-10-CM

## 2019-05-30 DIAGNOSIS — J449 Chronic obstructive pulmonary disease, unspecified: Secondary | ICD-10-CM | POA: Diagnosis not present

## 2019-05-30 DIAGNOSIS — R531 Weakness: Secondary | ICD-10-CM | POA: Diagnosis not present

## 2019-05-30 DIAGNOSIS — J302 Other seasonal allergic rhinitis: Secondary | ICD-10-CM

## 2019-05-30 DIAGNOSIS — I639 Cerebral infarction, unspecified: Secondary | ICD-10-CM | POA: Diagnosis not present

## 2019-05-30 DIAGNOSIS — J4550 Severe persistent asthma, uncomplicated: Secondary | ICD-10-CM

## 2019-05-30 LAB — COMPREHENSIVE METABOLIC PANEL
ALT: 30 U/L (ref 0–44)
AST: 21 U/L (ref 15–41)
Albumin: 3.7 g/dL (ref 3.5–5.0)
Alkaline Phosphatase: 91 U/L (ref 38–126)
Anion gap: 8 (ref 5–15)
BUN: 10 mg/dL (ref 8–23)
CO2: 23 mmol/L (ref 22–32)
Calcium: 8.6 mg/dL — ABNORMAL LOW (ref 8.9–10.3)
Chloride: 108 mmol/L (ref 98–111)
Creatinine, Ser: 0.84 mg/dL (ref 0.61–1.24)
GFR calc Af Amer: >60 mL/min (ref >60)
GFR calc non Af Amer: >60 mL/min (ref >60)
Glucose, Bld: 105 mg/dL — ABNORMAL HIGH (ref 70–99)
Potassium: 3.7 mmol/L (ref 3.5–5.1)
Sodium: 139 mmol/L (ref 135–145)
Total Bilirubin: 0.9 mg/dL (ref 0.3–1.2)
Total Protein: 6.6 g/dL (ref 6.5–8.1)

## 2019-05-30 MED ORDER — LISINOPRIL 20 MG PO TABS: 20.0000 mg | ORAL_TABLET | Freq: Every day | ORAL | 1 refills | Status: DC

## 2019-05-30 MED ORDER — ALBUTEROL SULFATE (2.5 MG/3ML) 0.083% IN NEBU: 3.0000 mL | INHALATION_SOLUTION | RESPIRATORY_TRACT | Status: DC | PRN

## 2019-05-30 MED ORDER — ASPIRIN 81 MG PO CHEW: 81.0000 mg | CHEWABLE_TABLET | Freq: Every day | ORAL | Status: AC

## 2019-05-30 MED ORDER — IOHEXOL 350 MG/ML SOLN
75.0000 mL | Freq: Once | INTRAVENOUS | Status: AC | PRN
  Administered 2019-05-30: 14:00:00 75 mL via INTRAVENOUS

## 2019-05-30 MED ORDER — IPRATROPIUM-ALBUTEROL 0.5-2.5 (3) MG/3ML IN SOLN
3.0000 mL | RESPIRATORY_TRACT | Status: DC
  Administered 2019-05-30: 3 mL via RESPIRATORY_TRACT
  Filled 2019-05-30: qty 3

## 2019-05-30 MED ORDER — FLUTICASONE PROPIONATE 50 MCG/ACT NA SUSP
1.0000 | Freq: Two times a day (BID) | NASAL | Status: DC
  Administered 2019-05-30: 1 via NASAL
  Filled 2019-05-30: qty 16

## 2019-05-30 NOTE — Discharge Summary (Signed)
Physician Discharge Summary  Caleb Evans:[id number] DOB: 1951-05-02 DOA: 05/29/2019  PCP: Theotis Burrow, MD Neurologist: Merlene Laughter  Admit date: 05/29/2019 Discharge date: 05/30/2019  Admitted From:  Home  Disposition: Home with outpatient PT/OT  Recommendations for Outpatient Follow-up:  1. Follow up with PCP in 1 weeks 2. Follow up with neurologist in 4 weeks  Discharge Condition: STABLE   CODE STATUS: FULL    Brief Hospitalization Summary: Please see all hospital notes, images, labs for full details of the hospitalization. HPI: Caleb Evans is a 68 y.o. male former heavy smoker quit several months ago, COPD, asthma, AAA, history of bladder cancer, Crohn's disease, GERD who presented to the emergency department by EMS with concerns for CVA.  Patient says he woke up yesterday around 11 AM having symptoms of left arm and left leg numbness and weakness.  He was having some ataxia as well.  He said that he had been normal the day prior.  He denies any speech impediments.  His main complaint was the difficulty with ambulating.  He denies changes in vision.  He denies headache.  He denies shortness of breath and chest pain.  He reports that the symptoms are new to him.  ED course: Patient was seen by tele- neurology and they were concerned and suspected a lacunar stroke outside of the window for alteplase or NIR intervention.  They recommended that he be admitted and started on aspirin 81 mg daily.  I recommended an MRI brain without contrast and an MRA of the head and neck.  With stroke work-up.  Discharge Diagnoses:  Principal Problem:   CVA (cerebral vascular accident) ([place]) Active Problems:   COPD, mild (HCC)   Severe persistent asthma, uncomplicated   GERD (gastroesophageal reflux disease)   Seasonal and perennial allergic rhinitis   Hyperlipidemia   AAA (abdominal aortic aneurysm) without rupture [facility])   Former smoker - Quit 2020  Discharge  Instructions: Discharge Instructions    Ambulatory referral to Occupational Therapy   Complete by: As directed    Ambulatory referral to Physical Therapy   Complete by: As directed      Allergies as of 05/30/2019      Reactions   Chlorpheniramine Other (See Comments)   Sinus congestion   Amoxicillin-pot Clavulanate Other (See Comments)   UNSPECIFIED REACTION  Has patient had a PCN reaction causing immediate rash, facial/tongue/throat swelling, SOB or lightheadedness with hypotension: No Has patient had a PCN reaction causing severe rash involving mucus membranes or skin necrosis: No Has patient had a PCN reaction that required hospitalization: No Has patient had a PCN reaction occurring within the last 10 years: Yes If all of the above answers are "NO", then may proceed with Cephalosporin use. Caused liver failure       Medication List    TAKE these medications   aspirin 81 MG chewable tablet Chew 1 tablet (81 mg total) by mouth daily. Start taking on: May 31, 2019   atorvastatin 80 MG tablet Commonly known as: LIPITOR Take 80 mg by mouth at bedtime.   azelastine 0.1 % nasal spray Commonly known as: ASTELIN Place 2 sprays into both nostrils 2 (two) times daily as needed for up to 30 days for rhinitis. Use in each nostril as directed   CALCIUM 600+D3 PO Take 1 tablet by mouth daily.   Dulera 100-5 MCG/ACT Aero Generic drug: mometasone-formoterol Inhale 2 puffs into the lungs 2 (two) times daily.   lisinopril 20 MG tablet Commonly known  as: ZESTRIL Take 1 tablet (20 mg total) by mouth daily.   multivitamin with minerals Tabs tablet Take 1 tablet by mouth daily.   Nucala 100 MG/ML Soaj Generic drug: Mepolizumab Inject into the skin.   Olopatadine HCl 0.2 % Soln Commonly known as: Pataday Place 1 drop into both eyes daily as needed (for irritated/allergy eyes.).   ProAir HFA 108 (90 Base) MCG/ACT inhaler Generic drug: albuterol INHALE 2 PUFFS EVERY 4 TO 6  HOURS AS NEEDED.   Xhance 93 MCG/ACT Exhu Generic drug: Fluticasone Propionate Place 2 puffs into the nose 2 (two) times daily. What changed: Another medication with the same name was removed. Continue taking this medication, and follow the directions you see here.            Durable Medical Equipment  (From admission, onward)         Start     Ordered   05/30/19 0908  For home use only DME Walker rolling  Once    Question:  Patient needs a walker to treat with the following condition  Answer:  Gait instability   05/30/19 0907         Follow-up Information    Revelo, Elyse Jarvis, MD. Schedule an appointment as soon as possible for a visit in 1 week(s).   Specialty: Family Medicine Contact information: [address] [place] [address] [phone]        Phillips Odor, MD. Schedule an appointment as soon as possible for a visit in 1 month(s).   Specialty: Neurology Why: stroke follow up  Contact information: [address] [place] [address] [phone]          Allergies  Allergen Reactions  . Chlorpheniramine Other (See Comments)    Sinus congestion  . Amoxicillin-Pot Clavulanate Other (See Comments)    UNSPECIFIED REACTION  Has patient had a PCN reaction causing immediate rash, facial/tongue/throat swelling, SOB or lightheadedness with hypotension: No Has patient had a PCN reaction causing severe rash involving mucus membranes or skin necrosis: No Has patient had a PCN reaction that required hospitalization: No Has patient had a PCN reaction occurring within the last 10 years: Yes If all of the above answers are "NO", then may proceed with Cephalosporin use.  Caused liver failure    Allergies as of 05/30/2019      Reactions   Chlorpheniramine Other (See Comments)   Sinus congestion   Amoxicillin-pot Clavulanate Other (See Comments)   UNSPECIFIED REACTION  Has patient had a PCN reaction causing immediate rash,  facial/tongue/throat swelling, SOB or lightheadedness with hypotension: No Has patient had a PCN reaction causing severe rash involving mucus membranes or skin necrosis: No Has patient had a PCN reaction that required hospitalization: No Has patient had a PCN reaction occurring within the last 10 years: Yes If all of the above answers are "NO", then may proceed with Cephalosporin use. Caused liver failure       Medication List    TAKE these medications   aspirin 81 MG chewable tablet Chew 1 tablet (81 mg total) by mouth daily. Start taking on: May 31, 2019   atorvastatin 80 MG tablet Commonly known as: LIPITOR Take 80 mg by mouth at bedtime.   azelastine 0.1 % nasal spray Commonly known as: ASTELIN Place 2 sprays into both nostrils 2 (two) times daily as needed for up to 30 days for rhinitis. Use in each nostril as directed   CALCIUM 600+D3 PO Take 1 tablet by  mouth daily.   Dulera 100-5 MCG/ACT Aero Generic drug: mometasone-formoterol Inhale 2 puffs into the lungs 2 (two) times daily.   lisinopril 20 MG tablet Commonly known as: ZESTRIL Take 1 tablet (20 mg total) by mouth daily.   multivitamin with minerals Tabs tablet Take 1 tablet by mouth daily.   Nucala 100 MG/ML Soaj Generic drug: Mepolizumab Inject into the skin.   Olopatadine HCl 0.2 % Soln Commonly known as: Pataday Place 1 drop into both eyes daily as needed (for irritated/allergy eyes.).   ProAir HFA 108 (90 Base) MCG/ACT inhaler Generic drug: albuterol INHALE 2 PUFFS EVERY 4 TO 6 HOURS AS NEEDED.   Xhance 93 MCG/ACT Exhu Generic drug: Fluticasone Propionate Place 2 puffs into the nose 2 (two) times daily. What changed: Another medication with the same name was removed. Continue taking this medication, and follow the directions you see here.            Durable Medical Equipment  (From admission, onward)         Start     Ordered   05/30/19 0908  For home use only DME Walker rolling   Once    Question:  Patient needs a walker to treat with the following condition  Answer:  Gait instability   05/30/19 0907          Procedures/Studies: Ct Head Wo Contrast  Result Date: 05/29/2019 CLINICAL DATA:  Left-sided weakness. Awoke yesterday at 1100 hour with left-sided weakness. EXAM: CT HEAD WITHOUT CONTRAST TECHNIQUE: Contiguous axial images were obtained from the base of the skull through the vertex without intravenous contrast. COMPARISON:  None. FINDINGS: Brain: No intracranial hemorrhage, mass effect, or midline shift. Remote appearing lacunar infarct in the right basal ganglia. No evidence of acute ischemia. Age related atrophy. Mild periventricular white matter changes consistent with chronic small vessel ischemia. No hydrocephalus. The basilar cisterns are patent. No extra-axial or intracranial fluid collection. Vascular: Atherosclerosis of skullbase vasculature without hyperdense vessel or abnormal calcification. Skull: No fracture or focal lesion. Sinuses/Orbits: Paranasal sinuses and mastoid air cells are clear. The visualized orbits are unremarkable. Other: None. IMPRESSION: 1. No acute intracranial abnormality. 2. Age related atrophy and chronic small vessel ischemia. Remote appearing lacunar infarct in the right basal ganglia. Electronically Signed   By: Keith Rake M.D.   On: 05/29/2019 05:30   US Carotid Bilateral (at [facility] Only)  Result Date: 05/29/2019 CLINICAL DATA:  TIA. EXAM: BILATERAL CAROTID DUPLEX ULTRASOUND TECHNIQUE: Pearline Cables scale imaging, color Doppler and duplex ultrasound were performed of bilateral carotid and vertebral arteries in the neck. COMPARISON:  None. FINDINGS: Criteria: Quantification of carotid stenosis is based on velocity parameters that correlate the residual internal carotid diameter with NASCET-based stenosis levels, using the diameter of the distal internal carotid lumen as the denominator for stenosis measurement. The following  velocity measurements were obtained: RIGHT ICA: 78/27 cm/sec CCA: 89/21 cm/sec SYSTOLIC ICA/CCA RATIO:  1.0 ECA: 76 cm/sec LEFT ICA: 81/31 cm/sec CCA: 19/41 cm/sec SYSTOLIC ICA/CCA RATIO:  1.0 ECA: 81 cm/sec RIGHT CAROTID ARTERY: There is a minimal amount of eccentric mixed echogenic plaque within the right carotid bulb (images 21 and 23), not resulting in elevated peak systolic velocities within the interrogated course the right internal carotid artery to suggest a hemodynamically significant stenosis. RIGHT VERTEBRAL ARTERY:  Antegrade flow LEFT CAROTID ARTERY: There is a minimal amount of eccentric echogenic plaque within the left carotid bulb (images 66 and 68), not resulting in elevated peak systolic velocities  within the interrogated course the left internal carotid artery to suggest a hemodynamically significant stenosis. LEFT VERTEBRAL ARTERY:  Antegrade flow IMPRESSION: Minimal amount of bilateral atherosclerotic plaque, not resulting in a hemodynamically significant stenosis within either internal carotid artery. Electronically Signed   By: Sandi Mariscal M.D.   On: 05/29/2019 12:29      Subjective: Pt says his weakness is improving.  He is eating and drinking well and tolerating medications.   Discharge Exam: Vitals:   05/30/19 0940 05/30/19 1211  BP: (!) 157/85   Pulse: 62   Resp: 16   Temp: (!) 97.5 F (36.4 C)   SpO2: 96% 97%   Vitals:   05/30/19 0740 05/30/19 0746 05/30/19 0940 05/30/19 1211  BP: (!) 156/93  (!) 157/85   Pulse: 73  62   Resp: 18  16   Temp: 98 F (36.7 C)  (!) 97.5 F (36.4 C)   TempSrc: Oral  Oral   SpO2: 97% 98% 96% 97%  Weight:      Height:       General: Pt is alert, awake, not in acute distress Cardiovascular: RRR, S1/S2 +, no rubs, no gallops Respiratory: CTA bilaterally, no wheezing, no rhonchi Abdominal: Soft, NT, ND, bowel sounds + Extremities: no edema, no cyanosis Neurological: 4/5 strength LUL/LLE, 5/5 RUE, RLE.    The results of  significant diagnostics from this hospitalization (including imaging, microbiology, ancillary and laboratory) are listed below for reference.     Microbiology: Recent Results (from the past 240 hour(s))  SARS CORONAVIRUS 2 (TAT 6-24 HRS) Nasopharyngeal Nasopharyngeal Swab     Status: None   Collection Time: 05/29/19  5:41 AM   Specimen: Nasopharyngeal Swab  Result Value Ref Range Status   SARS Coronavirus 2 NEGATIVE NEGATIVE Final    Comment: (NOTE) SARS-CoV-2 target nucleic acids are NOT DETECTED. The SARS-CoV-2 RNA is generally detectable in upper and lower respiratory specimens during the acute phase of infection. Negative results do not preclude SARS-CoV-2 infection, do not rule out co-infections with other pathogens, and should not be used as the sole basis for treatment or other patient management decisions. Negative results must be combined with clinical observations, patient history, and epidemiological information. The expected result is Negative. Fact Sheet for Patients: SugarRoll.be Fact Sheet for Healthcare Providers: https://www.woods-mathews.com/ This test is not yet approved or cleared by the [place] FDA and  has been authorized for detection and/or diagnosis of SARS-CoV-2 by FDA under an Emergency Use Authorization (EUA). This EUA will remain  in effect (meaning this test can be used) for the duration of the COVID-19 declaration under Section 56 4(b)(1) of the Act, 21 U.S.C. section 360bbb-3(b)(1), unless the authorization is terminated or revoked sooner. Performed at [facility], [place] [address], [place], [place] [address]      Labs: BNP (last 3 results) No results for input(s): BNP in the last 8760 hours. Basic Metabolic Panel: Recent Labs  Lab 05/29/19 0456 05/30/19 0602  NA 137 139  K 3.3* 3.7  CL 105 108  CO2 23 23  GLUCOSE 119* 105*  BUN 11 10  CREATININE 0.85 0.84  CALCIUM 9.0 8.6*   Liver  Function Tests: Recent Labs  Lab 05/29/19 0456 05/30/19 0602  AST 25 21  ALT 34 30  ALKPHOS 101 91  BILITOT 0.7 0.9  PROT 7.0 6.6  ALBUMIN 4.0 3.7   No results for input(s): LIPASE, AMYLASE in the last 168 hours. No results for input(s): AMMONIA in the  last 168 hours. CBC: Recent Labs  Lab 05/29/19 0456  WBC 9.2  NEUTROABS 5.1  HGB 14.4  HCT 43.4  MCV 92.7  PLT 362   Cardiac Enzymes: No results for input(s): CKTOTAL, CKMB, CKMBINDEX, TROPONINI in the last 168 hours. BNP: Invalid input(s): POCBNP CBG: Recent Labs  Lab 05/29/19 1606  GLUCAP 103*   D-Dimer No results for input(s): DDIMER in the last 72 hours. Hgb A1c Recent Labs    05/29/19 0456  HGBA1C 6.1*   Lipid Profile Recent Labs    05/29/19 0456  CHOL 135  HDL 33*  LDLCALC 50  TRIG 259*  CHOLHDL 4.1   Thyroid function studies Recent Labs    05/29/19 0456  TSH 2.432   Anemia work up Recent Labs    05/29/19 0456  VITAMINB12 457   Urinalysis    Component Value Date/Time   COLORURINE STRAW (A) 05/29/2019 0456   APPEARANCEUR CLEAR 05/29/2019 0456   APPEARANCEUR Clear 07/09/2017 1446   LABSPEC 1.006 05/29/2019 0456   PHURINE 5.0 05/29/2019 0456   [place] 05/29/2019 0456   [place] 05/29/2019 0456   [place] 05/29/2019 0456   BILIRUBINUR Negative 07/09/2017 1446   KETONESUR NEGATIVE 05/29/2019 0456   PROTEINUR NEGATIVE 05/29/2019 0456   NITRITE NEGATIVE 05/29/2019 0456   LEUKOCYTESUR NEGATIVE 05/29/2019 0456   Sepsis Labs Invalid input(s): PROCALCITONIN,  WBC,  LACTICIDVEN Microbiology Recent Results (from the past 240 hour(s))  SARS CORONAVIRUS 2 (TAT 6-24 HRS) Nasopharyngeal Nasopharyngeal Swab     Status: None   Collection Time: 05/29/19  5:41 AM   Specimen: Nasopharyngeal Swab  Result Value Ref Range Status   SARS Coronavirus 2 NEGATIVE NEGATIVE Final    Comment: (NOTE) SARS-CoV-2 target nucleic acids are NOT DETECTED. The SARS-CoV-2 RNA is  generally detectable in upper and lower respiratory specimens during the acute phase of infection. Negative results do not preclude SARS-CoV-2 infection, do not rule out co-infections with other pathogens, and should not be used as the sole basis for treatment or other patient management decisions. Negative results must be combined with clinical observations, patient history, and epidemiological information. The expected result is Negative. Fact Sheet for Patients: SugarRoll.be Fact Sheet for Healthcare Providers: https://www.woods-mathews.com/ This test is not yet approved or cleared by the [place] FDA and  has been authorized for detection and/or diagnosis of SARS-CoV-2 by FDA under an Emergency Use Authorization (EUA). This EUA will remain  in effect (meaning this test can be used) for the duration of the COVID-19 declaration under Section 56 4(b)(1) of the Act, 21 U.S.C. section 360bbb-3(b)(1), unless the authorization is terminated or revoked sooner. Performed at [facility], [place] [address], [place], [place] [address]    Time coordinating discharge:   SIGNED:  Irwin Brakeman, MD  [facility] 05/30/2019, 2:02 PM How to contact the [facility] Attending or Consulting provider [place] or covering provider during after hours [place], for this patient?  1. Check the care team in [facility] and look for a) attending/consulting TRH provider listed and b) the [facility] team listed 2. Log into [email or web address] and use [place]'s universal password to access. If you do not have the password, please contact the hospital operator. 3. Locate the [facility] provider you are looking for under [facility] and page to a number that you can be directly reached. 4. If you still have difficulty reaching the provider, please page the [facility] (Director on Call) for the Hospitalists listed on amion for assistance.

## 2019-05-30 NOTE — Plan of Care (Signed)
  Problem: Acute Rehab OT Goals (only OT should resolve) Goal: Pt. Will Perform Grooming Flowsheets (Taken 05/30/2019 0851) Pt Will Perform Grooming:  with modified independence  standing Goal: Pt. Will Perform Lower Body Dressing Flowsheets (Taken 05/30/2019 0851) Pt Will Perform Lower Body Dressing:  with supervision  sit to/from stand Goal: Pt. Will Transfer To Toilet Flowsheets (Taken 05/30/2019 [phone]) Pt Will Transfer to Toilet:  with supervision  ambulating  regular height toilet Goal: Pt. Will Perform Toileting-Clothing Manipulation Flowsheets (Taken 05/30/2019 0851) Pt Will Perform Toileting - Clothing Manipulation and hygiene:  with supervision  sitting/lateral leans  sit to/from stand Goal: Pt/Caregiver Will Perform Home Exercise Program Flowsheets (Taken 05/30/2019 [phone]) Pt/caregiver will Perform Home Exercise Program:  Increased strength  Left upper extremity  With written HEP provided  Independently

## 2019-05-30 NOTE — Progress Notes (Signed)
Physical Therapy Treatment Patient Details Name: Caleb Evans MRN: [id number] DOB: 01-05-1951 Today's Date: 05/30/2019    History of Present Illness Caleb Evans is a 68 y.o. male former heavy smoker quit several months ago, COPD, asthma, AAA, history of bladder cancer, Crohn's disease, GERD who presented to the emergency department by EMS with concerns for CVA.  Patient says he woke up yesterday around 11 AM having symptoms of left arm and left leg numbness and weakness.  He was having some ataxia as well.  He said that he had been normal the day prior.  He denies any speech impediments.  His main complaint was the difficulty with ambulating.  He denies changes in vision.  He denies headache.  He denies shortness of breath and chest pain.  He reports that the symptoms are new to him.    PT Comments    Patient limited for functional mobility as stated below secondary to LLE weakness, fatigue and poor standing balance without use of AD. Patient able to ambulate increased distance using RW without verbal cueing for sequencing with RW today. He has occasional instances of L knee buckling/ hyperextending while weight bearing on LLE during gait secondary to impaired L quad strength/motor control. Patient educated on using RW for safety and to reduce the risk of falls. Patient was denied admission to previously recommended CIR, PT now recommending outpatient PT.  Patient will benefit from continued physical therapy in hospital and recommended venue below to increase strength, balance, endurance for safe ADLs and gait.    Follow Up Recommendations  Outpatient PT(patient was denied CIR)     Equipment Recommendations  Rolling walker with 5" wheels    Recommendations for Other Services       Precautions / Restrictions Precautions Precautions: Fall Restrictions Weight Bearing Restrictions: No    Mobility  Bed Mobility Overal bed mobility: Modified Independent              General bed mobility comments: slow  Transfers Overall transfer level: Needs assistance Equipment used: Rolling walker (2 wheeled) Transfers: Sit to/from Stand Sit to Stand: Min guard         General transfer comment: verbal cueing to not place both hands on RW while transfering to standing  Ambulation/Gait Ambulation/Gait assistance: Modified independent (Device/Increase time);Min guard Gait Distance (Feet): 200 Feet Assistive device: Rolling walker (2 wheeled) Gait Pattern/deviations: Step-through pattern;Decreased step length - right;Decreased step length - left;Decreased stride length Gait velocity: decreased   General Gait Details: patient ambulates with RW, maintains RW close without verbal cueing, does not catch LLE today, occasionally L knee buckling or hyperextension   Stairs             Wheelchair Mobility    Modified Rankin (Stroke Patients Only)       Balance Overall balance assessment: Needs assistance Sitting-balance support: Feet supported;No upper extremity supported Sitting balance-Leahy Scale: Normal Sitting balance - Comments: in chair   Standing balance support: Bilateral upper extremity supported Standing balance-Leahy Scale: Good Standing balance comment: using RW                            Cognition Arousal/Alertness: Awake/alert Behavior During Therapy: WFL for tasks assessed/performed Overall Cognitive Status: Within Functional Limits for tasks assessed  Exercises      General Comments        Pertinent Vitals/Pain Pain Assessment: No/denies pain    Home Living                      Prior Function            PT Goals (current goals can now be found in the care plan section) Acute Rehab PT Goals Patient Stated Goal: go home PT Goal Formulation: With patient Time For Goal Achievement: 06/12/19 Potential to Achieve Goals: Good Progress towards  PT goals: Progressing toward goals    Frequency    7X/week      PT Plan Current plan remains appropriate    Co-evaluation              AM-PAC PT "6 Clicks" Mobility   Outcome Measure  Help needed turning from your back to your side while in a flat bed without using bedrails?: None Help needed moving from lying on your back to sitting on the side of a flat bed without using bedrails?: None Help needed moving to and from a bed to a chair (including a wheelchair)?: A Little Help needed standing up from a chair using your arms (e.g., wheelchair or bedside chair)?: A Little Help needed to walk in hospital room?: A Little Help needed climbing 3-5 steps with a railing? : A Little 6 Click Score: 20    End of Session Equipment Utilized During Treatment: Gait belt Activity Tolerance: Patient tolerated treatment well Patient left: in chair;with call bell/phone within reach;with family/visitor present Nurse Communication: Mobility status PT Visit Diagnosis: Unsteadiness on feet (R26.81);Other abnormalities of gait and mobility (R26.89);Muscle weakness (generalized) (M62.81);Other symptoms and signs involving the nervous system (R29.898)     Time: 5945-8592 PT Time Calculation (min) (ACUTE ONLY): 17 min  Charges:  $Gait Training: 8-22 mins                     2:34 PM, 05/30/19 Mearl Latin PT, DPT Physical Therapist at [facility]

## 2019-05-30 NOTE — Progress Notes (Signed)
IV removed, 2x2 gauze and paper tape applied to site, patient tolerated well.  Reviewed AVS with patient and patient's wife, both verbalized understanding.  Patient to be taken to lobby via wheelchair and transported home by his wife.

## 2019-05-30 NOTE — TOC Transition Note (Addendum)
Transition of Care [facility]) - CM/SW Discharge Note   Patient Details  Name: Caleb Evans MRN: [id number] Date of Birth: 1951/06/28  Transition of Care [facility]) CM/SW Contact:  Imari Reen, Chauncey Reading, RN Phone Number: 05/30/2019, 10:01 AM   Clinical Narrative:    Patient recommended for OP PT and OT and RW. Orders placed for OP rehab. RW to be delivered to room prior to DC.      Expected Discharge Plan and Services     Discharge Planning Services: CM Consult                     DME Arranged: Walker rolling DME Agency: [employer] Date DME Agency Contacted: 05/30/19 Time DME Agency Contacted: 1003 Representative spoke with at DME Agency: Juliann Pulse             Patient Goals and CMS Choice     Choice offered to / list presented to : Patient  Expected Discharge Plan and Services     Discharge Planning Services: CM Consult                     DME Arranged: Walker rolling DME Agency: [employer]                 Activities of Daily Living Home Assistive Devices/Equipment: None ADL Screening (condition at time of admission) Patient's cognitive ability adequate to safely complete daily activities?: Yes Is the patient deaf or have difficulty hearing?: No Does the patient have difficulty seeing, even when wearing glasses/contacts?: No Does the patient have difficulty concentrating, remembering, or making decisions?: No Patient able to express need for assistance with ADLs?: Yes Does the patient have difficulty dressing or bathing?: No Independently performs ADLs?: Yes (appropriate for developmental age) Does the patient have difficulty walking or climbing stairs?: Yes Weakness of Legs: Left Weakness of Arms/Hands: Left         Admission diagnosis:  Cerebrovascular accident (CVA), unspecified mechanism ([place]) [I63.9] Patient Active Problem List   Diagnosis Date Noted  . CVA (cerebral vascular accident) ([place]) 05/29/2019  . Former smoker - Quit 2020  05/29/2019  . Aneurysm of thoracic aorta ([place]) 11/22/2017  . Hyperlipidemia 11/22/2017  . AAA (abdominal aortic aneurysm) without rupture ([place]) 11/22/2017  . Seasonal and perennial allergic rhinitis 04/19/2017  . Severe persistent asthma, uncomplicated 02/77/4128  . GERD (gastroesophageal reflux disease) 04/08/2016  . Chronic nonseasonal allergic rhinitis due to fungal spores 01/17/2014  . 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 Mild chronic obstructive pulmonary disease ([place]) 01/17/2014   PCP:  Theotis Burrow, MD Pharmacy:   [place], [place] [address] [place] [address] Phone: [phone] Fax: [phone]  [place], [place] [address] Phone: [phone] Fax: [place], [place] [address] Phone: [phone] Fax: [phone], [facility] - [place], [address] [place] [address] Phone: [phone] Fax: [phone]  [place], [place] - [address] [place] [address] Phone: [phone] Fax: [phone]   Final next level of care: OP Rehab     Patient Goals and CMS Choice     Choice offered to / list presented to : Patient  Discharge Plan and Services   Discharge Planning Services: CM Consult            DME Arranged: Walker rolling DME Agency: [employer]

## 2019-05-30 NOTE — Progress Notes (Signed)
Rehab Admissions Coordinator Note:  Patient was screened by Cleatrice Burke for appropriateness for an Inpatient Acute Rehab Consult per PT recommendation.  Patient is Modified independent to min guard assist 100 feet with RW. Too high functioning to be considered for an inpt rehab admission at this level. Not in need of intensive Inpt rehab at this level. Recommend outpatient therapy.   Cleatrice Burke RN MSN 05/30/2019, 7:52 AM  I can be reached at [phone].

## 2019-05-30 NOTE — Discharge Instructions (Signed)
Eating Plan After Stroke A stroke causes damage to the brain cells, which can affect your ability to walk, talk, and even eat. The impact of a stroke is different for everyone, and so is recovery. A good nutrition plan is important for your recovery. It can also lower your risk of another stroke. If you have difficulty chewing and swallowing your food, a dietitian or your stroke care team can help so that you can enjoy eating healthy foods. What are tips for following this plan?  Reading food labels  Choose foods that have less than 300 milligrams (mg) of sodium per serving. Limit your sodium intake to less than 1,500 mg per day.  Avoid foods that have saturated fat and trans fat.  Choose foods that are low in cholesterol. Limit the amount of cholesterol you eat each day to less than 200 mg.  Choose foods that are high in fiber. Eat 20-30 grams (g) of fiber each day.  Avoid foods with added sugar. Check the food label for ingredients such as sugar, corn syrup, honey, fructose, molasses, and cane juice. Shopping  At the grocery store, buy most of your food from areas near the walls of the store. This includes: ? Fresh fruits and vegetables. ? Dry grains, beans, nuts, and seeds. ? Fresh seafood, poultry, lean meats, and eggs. ? Low-fat dairy products.  Buy whole ingredients instead of prepackaged foods.  Buy fresh, in-season fruits and vegetables from local farmers markets.  Buy frozen fruits and vegetables in resealable bags. Cooking  Prepare foods with very little salt. Use herbs or salt-free spices instead.  Cook with heart-healthy oils, such as olive, avocado, canola, soybean, or sunflower oil.  Avoid frying foods. Bake, grill, or broil foods instead.  Remove visible fat and skin from meat and poultry before eating.  Modify food textures as told by your health care provider. Meal planning  Eat a wide variety of colorful fruits and vegetables. Make sure one-half of your  plate is filled with fruits and vegetables at each meal.  Eat fruits and vegetables that are high in potassium, such as: ? Apples, bananas, oranges, and melon. ? Sweet potatoes, spinach, zucchini, and tomatoes.  Eat fish that contain heart-healthy fats (omega-3 fats) at least twice a week. These include salmon, tuna, mackerel, and sardines.  Eat plant foods that are high in omega-3 fats, such as flaxseeds and walnuts. Add these to cereals, yogurt, or pasta dishes.  Eat several servings of high-fiber foods each day, such as fruits, vegetables, whole grains, and beans.  Do not put salt at the table for meals.  When eating out at restaurants: ? Ask the server about low-salt or salt-free food options. ? Avoid fried foods. Look for menu items that are grilled, steamed, broiled, or roasted. ? Ask if your food can be prepared without butter. ? Ask for condiments, such as salad dressings, gravy, or sauces to be served on the side.  If you have difficulty swallowing: ? Choose foods that are softer and easier to chew and swallow. ? Cut foods into small pieces and chew well before swallowing. ? Thicken liquids as told by your health care provider or dietitian. ? Let your health care provider know if your condition does not improve over time. You may need to work with a speech therapist to re-train the muscles that are used for eating. General recommendations  Involve your family and friends in your recovery, if possible. It may be helpful to have a slower meal time  and to plan meals that include foods everyone in the family can eat.  Brush your teeth with fluoride toothpaste twice a day, and floss once a day. Keeping a clean mouth can help you swallow and can also help your appetite.  Drink enough water each day to keep your urine pale yellow. If needed, set reminders or ask your family to help you remember to drink water.  Limit alcohol intake to no more than 1 drink a day for nonpregnant  women and 2 drinks a day for men. One drink equals 12 oz of beer, 5 oz of wine, or 1 oz of hard liquor. Summary  Following this eating plan can help in your stroke recovery and can decrease your risk for another stroke.  Let your health care provider know if you have problems with swallowing. You may need to work with a speech therapist. This information is not intended to replace advice given to you by your health care provider. Make sure you discuss any questions you have with your health care provider. Document Released: 10/04/2017 Document Revised: 11/17/2018 Document Reviewed: 10/04/2017 Elsevier Patient Education  2020 [employer].     Sensory Loss After a Stroke A stroke can damage parts of your brain that control your body's normal functions, including your senses. As a result, you may have sensory loss, such as trouble seeing, tasting, swallowing, or feeling touch or pressure sensations. You may have problems feeling temperature changes or moving your body in a coordinated way. You may also perceive smell differently. You may have problems with all of your senses or only some of them. By following a treatment plan, you may recover lost senses and manage the changes to your lifestyle. What are treatment therapies for sensory loss? You may have a combination of therapies for sensory loss.  Physical therapy. This may include: ? Exercises to improve your coordination and balance. ? Exercises that combine touch, balance, and movement (sensorimotor training). ? Movements to relieve pressure while you are sitting or lying (mobility training). ? Splints or braces to protect parts of your body that you cannot feel.  Devices to help with blood flow (circulation) and to help stimulate nerves in affected parts of your body.  Speech therapy to help you swallow safely.  Occupational therapy to help you with everyday tasks. This may include: ? Exercises or devices to improve your  vision. ? Exercises to help you increase your touch perception. These may include feeling objects of different sizes and textures while your eyes are closed. ? Techniques for moving safely in your environment.  Prescription eye glasses for vision loss.  Hearing aids for hearing loss. Follow these instructions at home: Safety   Your risk of falling is higher after a stroke. You may have difficulty feeling your legs and feet or coordinating your movements. To lower your risk of falling: ? Use devices to help you move around (assistive devices), such as a wheelchair or walker, as directed by your health care team. ? Wear prescription eye glasses at all times when moving around. ? Use lights to help you see in the dark. ? Use grab bars in bathrooms and handrails in stairways. ? Keep walkways clear in your home by removing rugs, cords, and clutter from the floor.  Your risk of getting burned is higher after a stroke. To lower your risk of burns: ? Test the water temperature before taking a bath or washing your hands. ? Allow hot foods to cool slightly before eating. ?  Use potholders when handling hot pans.  When using sharp objects, such as scissors or knives, use your healthy hand. Do not handle sharp objects with your hand that was affected by your stroke, if this applies. Activity  Return to your normal activities as told by your health care provider. Ask your health care provider what activities are safe for you.  Avoid spending too much time sitting or lying down. If you must be in a chair or bed, change positions regularly.  You may have problems doing your normal activities. Ask your health care provider about getting extra help at home. Eating and drinking   You may have problems swallowing food and fluids after a stroke. The problems can be due to: ? Changes in your muscles. ? Sensory changes, such as:  Difficulty feeling the consistency or size of a piece of food in your  mouth.  Inability to feel the need to clear your throat.  You may need to: ? Take smaller bites and chew thoroughly. Make sure you have swallowed all the food in your mouth before you take another bite. ? Sit in an upright position when eating or drinking. ? Avoid distractions while eating or drinking. ? Stay upright for 30-45 minutes after eating. ? Change the texture of some things that you eat and drink. This may include:  Changing foods to a smooth, mashed consistency (puree).  Thickening liquids.  Follow instructions from your health care provider about eating and drinking restrictions. General instructions  Wear arm or leg braces as told by your health care team.  Get help at home as needed. You may need help getting dressed, bathing, using the bathroom, eating, or doing other activities.  Keep all follow-up visits as told by your health care providers. This is important. Summary  It is common to have sensory loss after a stroke. Sensory loss means that you have problems with some or all of your senses, such as vision, taste, hearing, smell, and touch.  Sensory loss happens because of damage to your brain and nervous system after a stroke.  Treatment for sensory loss may include physical, occupational, or speech therapy, and the use of assistive devices.  You may need to make changes to your home and lifestyle after a stroke to help you live safely and independently. This information is not intended to replace advice given to you by your health care provider. Make sure you discuss any questions you have with your health care provider. Document Released: 11/13/2016 Document Revised: 11/18/2018 Document Reviewed: 11/13/2016 Elsevier Patient Education  [place].   Physical Therapy After a Stroke After a stroke, some people experience physical changes or problems. Physical therapy may be prescribed to help you recover and overcome problems such as:  Inability to  move (paralysis) or weakness, typically affecting one side of the body.  Trouble with balance.  Pain, a pins and needles sensation, or numbness in certain parts of the body. You may also have difficulty feeling touch, pressure, or changes in temperature.  Involuntary muscle tightening (spasticity).  Stiffness in muscles and joints.  Altered coordination and reflexes. What causes physical disability after a stroke? A stroke can damage parts of your brain that control your body's normal functions, including your ability to move and to keep your balance. The types of physical problems you have will depend on how severe the stroke was and where it was located in the brain. Weakness or paralysis may affect just your fingers and hands, a  whole leg or arm, or an entire side of your body. What is physical therapy? Physical therapy involves using exercises, stretches, and activities to help you regain movement and independence after your stroke. Physical therapy may focus on one or more of the following:  Range of motion. This can help with movement and reduce muscle stiffness.  Balance. This helps to lower your risk of falling.  Position changes or transfers, such as moving from sitting to standing or from a chair to a bed.  Coordination, such as getting an object from a shelf.  Muscle strength. Muscles may be strengthened with weights or by repeating certain motions.  Functional mobility. This may include stair training or learning how to use a wheelchair, walker, or cane.  Walking (gait training).  Activities of daily living, such as getting out of the car or buttoning a shirt. Why is physical therapy important? It is important to do exercises and follow your rehabilitation plan as told by your physical therapist. Physical therapy can:  Help you regain independence.  Prevent injury from falls by building strength and balance.  Lower your risk of blood clots.  Lower your risk of  skin sores (pressure injuries).  Increase physical activity and exercise. This may help lower your risk for another stroke.  Help reduce pain. When will therapy start and where will I have therapy? Your health care provider will decide when it is best for you to start therapy. In some cases, people start rehabilitation, including physical therapy, as soon as they are medically stable, which may be 24-48 hours after a stroke. Rehabilitation can take place in a few different places, based on your needs. It may take place in:  The hospital or an in-patient rehabilitation hospital.  An outpatient rehabilitation facility.  A long-term care facility.  A community rehabilitation clinic.  Your home. What are assistive devices? Assistive devices are tools to help you move, maintain balance, and manage daily tasks while recovering from a stroke. Your physical therapist may recommend and help you learn to use:  Equipment to help you move, such as wheelchairs, canes, or walkers.  Braces or splints to keep your arms, hands, legs, or feet in a comfortable and safe position.  Bathtub benches or grab bars to keep you safe in the bathroom.  Special utensils, bowls, and plates that allow you to eat with one hand. It is important to use these devices as told by your health care provider. Summary  After a stroke, some people may experience physical disabilities, such as weakness or paralysis, pain, or balance problems.  Physical therapy involves exercises, stretches, and activities that help to improve your ability to move and to handle daily tasks.  Physical therapy exercises focus on restoring range of motion, balance, coordination, muscle strength, and the ability to move (mobility).  Physical therapy can help you regain independence, prevent falls, and allow you to live a more active lifestyle after a stroke. This information is not intended to replace advice given to you by your health care  provider. Make sure you discuss any questions you have with your health care provider. Document Released: 11/02/2016 Document Revised: 11/17/2018 Document Reviewed: 11/02/2016 Elsevier Patient Education  [place] After a Stroke Caregivers provide essential physical and emotional support to people who have had a stroke. If you are supporting someone who has had a stroke, you play an important role in coordinating schedules, helping your loved one to communicate, and helping with the  overall rehabilitation plan. What do I need to know about my loved one's recovery? Recovering from a stroke can take weeks, months, or years. Some people may be able to return to a normal lifestyle, and other people may have permanent problems with movement (mobility), thinking, behavior, or communication. Understanding your loved one's condition can help you manage the role of caregiver. Your loved one's health care team may rely on you to provide information such as medical history and current medicines. How can I support my friend or family member? Planning for discharge from the hospital Ask to meet with a social worker or a stroke care coordinator, if one is available. This person can help you to plan for discharge. Before you leave the hospital, make sure you understand:  The recovery process after a stroke.  Physical, emotional, behavioral, and other changes that may affect your loved one after a stroke.  The treatments for stroke, including the medicines that your loved one has to take.  How to lower the risk of another stroke.  Diet and exercise changes for your loved one.  Whether you will need extra help at home.  Whether your loved one will need help using the bathroom, bathing, eating, or doing other activities.  Changes you have to make at home to make it safe for your loved one.  Whether you need to get special devices or equipment for your loved one. General  help  Help your loved one establish a daily routine. This may include setting reminders or having a shared day planner or calendar.  Encourage rest. Your loved one may need frequent breaks during social situations or other activities.  Be patient. Your loved one may take longer to complete tasks and to process information.  When giving instructions, give only one instruction at a time or give step-by-step lists. Multitasking can be difficult after a stroke.  Offer assistance with household chores or other daily tasks. Make "freezer meals" that can be reheated.  Do not set expectations about your loved one's recovery. Medical visits  Gently remind your loved one of tasks and medical visits if he or she is forgetful.  Provide transportation to and from appointments.  Attend rehabilitation appointments with your loved one. By being involved in the rehabilitation plan, you can encourage your loved one and help with exercises and therapy activities at home. Preventing falls   Follow instructions to prevent falls in your loved one's home. These may include: ? Installing grab bars in bathrooms and handrails in stairways. ? Using night-lights in the bedroom, bathroom, or hallways. ? Removing rugs and mats or making them stick to the floor. ? Keeping walkways clear by removing cords and clutter from the floor. Managing finances  Help to manage your loved one's finances. Talk with a Education officer, museum or legal professional if: ? Your loved one is unable to return to work and is in need of financial help. ? You need help paying medical bills. ? You need to establish guardianship over finances. ? You need help with estate planning. How should I care for myself? Helping a loved one recover from a stroke can be rewarding, and it can also be challenging and stressful at times. Make sure to care for your own well-being during this time. Lifestyle  Rest. Try to get 7-9 hours of uninterrupted sleep  each night.  Eat a balanced diet that includes fresh fruits and vegetables, whole grains, lean proteins, and low-fat dairy.  Exercise for 30 or more minutes  on 5 or more days each week.  Find ways to manage stress. These may include: ? Deep breathing, yoga, or meditation. ? Spending time outdoors. ? Journaling. Finding support   Ask for help. Take a break if you are the primary caregiver to your loved one.  Spend time with supportive people.  Join a support group with other caregivers or family members of people who have had a stroke.  If you experience new or worsening depression or anxiety, seek counseling from a mental health professional. Where to find more information You may find more information about supporting someone who has had a stroke from:  National Stroke Association: F[employer]dk  American Stroke Association: www.strokeassociation.org/STROKEORG Summary  Caregivers provide essential physical and emotional support to people who have had a stroke.  Before you leave the hospital, make sure you understand how to care for someone who has had a stroke.  It is normal to have many different emotions while caring for someone who has had a stroke. Make sure to care for your own well-being during this time. This information is not intended to replace advice given to you by your health care provider. Make sure you discuss any questions you have with your health care provider. Document Released: 11/13/2016 Document Revised: 11/17/2018 Document Reviewed: 11/13/2016 Elsevier Patient Education  [place].

## 2019-05-30 NOTE — Evaluation (Signed)
Occupational Therapy Evaluation Patient Details Name: Caleb Evans MRN: [id number] DOB: Mar 26, 1951 Today's Date: 05/30/2019    History of Present Illness Caleb Evans is a 68 y.o. male former heavy smoker quit several months ago, COPD, asthma, AAA, history of bladder cancer, Crohn's disease, GERD who presented to the emergency department by EMS with concerns for CVA.  Patient says he woke up yesterday around 11 AM having symptoms of left arm and left leg numbness and weakness.  He was having some ataxia as well.  He said that he had been normal the day prior.  He denies any speech impediments.  His main complaint was the difficulty with ambulating.  He denies changes in vision.  He denies headache.  He denies shortness of breath and chest pain.  He reports that the symptoms are new to him.   Clinical Impression   Pt agreeable to OT evaluation this am. Pt demonstrating LUE weakness and coordination deficits as listed below. Pt reports improvement in LUE mobility and strength since yesterday. Increased time required to completing tasks while incorporating LUE into task completion. Pt completing functional mobility tasks using RW, standing tasks at sink without LOB. Educated pt on LUE exercises to complete daily and of importance of incorporating LUE into ADLs. Recommend outpatient OT services on discharge to improve strength and coordination in LUE required for ADL completion, as well as improve safety and independence in functional tasks.    Follow Up Recommendations  Outpatient OT    Equipment Recommendations  None recommended by OT       Precautions / Restrictions Precautions Precautions: Fall Restrictions Weight Bearing Restrictions: No      Mobility Bed Mobility Overal bed mobility: Modified Independent             General bed mobility comments: Increased time, able to use LUE to push up onto elbow and into sitting  Transfers Overall transfer level: Needs  assistance Equipment used: Rolling walker (2 wheeled);None Transfers: Sit to/from [employer] to Stand: Min guard Stand pivot transfers: Supervision;Min guard       General transfer comment: Verbal cuing to place left hand on RW and push up from bed with RUE. No LLE buckling this am        ADL either performed or assessed with clinical judgement   ADL Overall ADL's : Needs assistance/impaired     Grooming: Wash/dry hands;Wash/dry face;Min guard;Standing Grooming Details (indicate cue type and reason): Pt using BUE during grooming tasks, leaning against sink for improved stability. Increased time required to incorporate LUE into tasks.          Upper Body Dressing : Supervision/safety;Sitting Upper Body Dressing Details (indicate cue type and reason): Increased time for task completion Lower Body Dressing: Supervision/safety;Sitting/lateral leans Lower Body Dressing Details (indicate cue type and reason): Pt seated on EOB donning slippers. Used RUE for task, did not incorporate LUE Toilet Transfer: Supervision/safety;Min guard;Ambulation;Regular Toilet;RW Toilet Transfer Details (indicate cue type and reason): simulated with transfer to chair. Pt careful during task, using RUE support prior to sitting         Functional mobility during ADLs: Min guard;Rolling walker       Vision Baseline Vision/History: Wears glasses Wears Glasses: At all times Patient Visual Report: No change from baseline Vision Assessment?: No apparent visual deficits            Pertinent Vitals/Pain Pain Assessment: No/denies pain     Hand Dominance Right   Extremity/Trunk Assessment Upper Extremity  Assessment Upper Extremity Assessment: LUE deficits/detail LUE Deficits / Details: shoulder 3-/5 (ROM limited to ~50% due to strength deficits), elbow 3+/5, wrist extensors 4-/5, wrist flexors 3/5, decreased grip strength LUE Sensation: WNL LUE Coordination: decreased fine  motor;decreased gross motor   Lower Extremity Assessment Lower Extremity Assessment: Defer to PT evaluation   Cervical / Trunk Assessment Cervical / Trunk Assessment: Normal   Communication Communication Communication: No difficulties   Cognition Arousal/Alertness: Awake/alert Behavior During Therapy: WFL for tasks assessed/performed Overall Cognitive Status: Within Functional Limits for tasks assessed                                        Exercises Exercises: General Upper Extremity General Exercises - Upper Extremity Shoulder Flexion: AROM;5 reps Shoulder ABduction: AROM;5 reps Shoulder ADduction: AROM;5 reps Shoulder Horizontal ABduction: AROM;5 reps Shoulder Horizontal ADduction: AROM;5 reps Elbow Flexion: AROM;5 reps Elbow Extension: AROM;5 reps Wrist Flexion: AROM;5 reps Wrist Extension: AROM;5 reps   Shoulder Instructions      Home Living   Living Arrangements: Spouse/significant other Available Help at Discharge: Family Type of Home: House Home Access: Stairs to enter Technical brewer of Steps: 3 Entrance Stairs-Rails: Left Home Layout: One level               Home Equipment: Cane - single point          Prior Functioning/Environment Level of Independence: Independent  Gait / Transfers Assistance Needed: patient reports community ambulation ADL's / Homemaking Assistance Needed: Pt is independent in ADLs at baseline, drives, enjoys riding motorcycle             OT Problem List: Decreased strength;Decreased activity tolerance;Impaired balance (sitting and/or standing);Decreased coordination;Decreased safety awareness;Decreased knowledge of use of DME or AE;Impaired UE functional use      OT Treatment/Interventions: Self-care/ADL training;Therapeutic exercise;Neuromuscular education;Therapeutic activities;DME and/or AE instruction;Patient/family education    OT Goals(Current goals can be found in the care plan section)  Acute Rehab OT Goals Patient Stated Goal: To get stronger and be able to ride motorcycle  OT Goal Formulation: With patient Time For Goal Achievement: 06/13/19 Potential to Achieve Goals: Good  OT Frequency: Min 2X/week    End of Session Equipment Utilized During Treatment: Gait belt;Rolling walker  Activity Tolerance: Patient tolerated treatment well Patient left: in chair;with call bell/phone within reach  OT Visit Diagnosis: Muscle weakness (generalized) (M62.81)                Time: 2505-3976 OT Time Calculation (min): 32 min Charges:  OT General Charges $OT Visit: 1 Visit OT Evaluation $OT Eval Low Complexity: 1 Low OT Treatments $Therapeutic Exercise: 8-22 mins   Guadelupe Sabin, OTR/L  [phone] 05/30/2019, 8:48 AM

## 2019-06-01 ENCOUNTER — Encounter: Payer: Self-pay | Admitting: Neurology

## 2019-06-06 ENCOUNTER — Encounter (HOSPITAL_COMMUNITY): Payer: Self-pay

## 2019-06-06 ENCOUNTER — Ambulatory Visit (HOSPITAL_COMMUNITY): Payer: Medicare Other | Attending: Family Medicine

## 2019-06-06 ENCOUNTER — Other Ambulatory Visit: Payer: Self-pay

## 2019-06-06 DIAGNOSIS — M6281 Muscle weakness (generalized): Secondary | ICD-10-CM | POA: Insufficient documentation

## 2019-06-06 DIAGNOSIS — R29818 Other symptoms and signs involving the nervous system: Secondary | ICD-10-CM | POA: Diagnosis present

## 2019-06-06 DIAGNOSIS — R2681 Unsteadiness on feet: Secondary | ICD-10-CM | POA: Diagnosis present

## 2019-06-06 DIAGNOSIS — R262 Difficulty in walking, not elsewhere classified: Secondary | ICD-10-CM | POA: Diagnosis present

## 2019-06-06 NOTE — Addendum Note (Signed)
Addended by: Laneta Simmers B on: 06/06/2019 03:19 PM   Modules accepted: Orders

## 2019-06-06 NOTE — Therapy (Addendum)
[place], [place], [address] Phone: [phone]   Fax:  [phone]  Physical Therapy Evaluation  Patient Details  Name: Caleb Evans MRN: [id number] Date of Birth: Jul 29, 1951 Referring Provider (PT): Irwin Brakeman, MD   Encounter Date: 06/06/2019  PT End of Session - 06/06/19 1501    Visit Number  1    Number of Visits  18    Date for PT Re-Evaluation  07/25/19    Authorization Type  Medicare; Secondary: Generic commercial    Authorization Time Period  06/06/19 to 07/28/19    Authorization - Visit Number  1    Authorization - Number of Visits  10    PT Start Time  9326    PT Stop Time  1433    PT Time Calculation (min)  45 min    Equipment Utilized During Treatment  Gait belt    Activity Tolerance  Patient tolerated treatment well    Behavior During Therapy  [facility] for tasks assessed/performed       Past Medical History:  Diagnosis Date  . AAA (abdominal aortic aneurysm) ([place])   . Adrenal nodule ([place])   . Asthma   . Bladder mass   . Bladder neoplasm   . Cancer [facility])    bladder cancer  . COPD (chronic obstructive pulmonary disease) ([place])   . Crohn's disease ([place])   . Diverticulitis   . GERD (gastroesophageal reflux disease)    takes Nexium off and on  . Over weight   . Skin lesion   . Thoracic aortic aneurysm ([place])   . Weak urinary stream     Past Surgical History:  Procedure Laterality Date  . APPENDECTOMY    . CHOLECYSTECTOMY    . LAPAROSCOPIC PARTIAL COLECTOMY  01/18/2018   ERAS PATHWAY  . LAPAROSCOPIC PARTIAL COLECTOMY N/A 01/18/2018   Procedure: LAPAROSCOPIC PARTIAL COLECTOMY ERAS PATHWAY;  Surgeon: Stark Klein, MD;  Location: [facility];  Service: General;  Laterality: N/A;  . TONSILLECTOMY      There were no vitals filed for this visit.   Subjective Assessment - 06/06/19 1353    Subjective  Pt reports walking a little better since leaving hospital, able to walk down the  block yesterday. Pt reports unable to tie shoes because of L hand. Pt reports completely independent dressing, yard work, driving prior to CVA. Pt reports first attempt at driving was today, but became tired and spouse had to switch to driver's seat to complete 30 minute drive. Pt reports RLE getting tired form depending on it. Pt reports L leg "seizes up then let's go", buckles majority of the time when rising from seated surface. Pt denies numbness/tingling in LLE and denies any falls. Pt reports has a basement, hasn't attempted going into it, but no issues entering home with a few steps using handrail. Pt reports going to see neurologist next week. Pt reports uses RW and SPC, but the [facility] is faster, and previously did not use an AD.    Patient is accompained by:  Family member   Spouse   Pertinent History  AAA    Limitations  Walking    How long can you sit comfortably?  no issues    How long can you stand comfortably?  30 minutes    How long can you walk comfortably?  20-30 minutes    Diagnostic tests  CT10/19/20 - no acute intracranial abnormality; age related atrophy and chronic small vessel ischemia, remote  appearing lacunar infarct in R basal ganglia    Patient Stated Goals  to walk again    Currently in Pain?  No/denies         [facility] PT Assessment - 06/06/19 0001      Assessment   Medical Diagnosis  CVA    Referring Provider (PT)  Irwin Brakeman, MD    Onset Date/Surgical Date  05/29/19    Hand Dominance  Right    Next MD Visit  Neurologist, 06/12/19    Prior Therapy  Acute care, PT      Precautions   Precautions  None      Restrictions   Weight Bearing Restrictions  No      Balance Screen   Has the patient fallen in the past 6 months  No    Has the patient had a decrease in activity level because of a fear of falling?   No    Is the patient reluctant to leave their home because of a fear of falling?   No      Prior Function   Level of Independence  Independent     Leisure  yardwork, ride motorcycle, working on cars, vacations      Cognition   Overall Cognitive Status  Within Functional Limits for tasks assessed      Observation/Other Assessments   Focus on Therapeutic Outcomes (FOTO)   36% limited      Sensation   Light Touch  Appears Intact      Coordination   Finger Nose Finger Test  impaired LUE, ~3in from nose    Heel Shin Test  LLE, mild impairment with fair control      Functional Tests   Functional tests  Sit to Stand      Sit to Stand   Comments  5x STS: 20 sec, from chair, no UE assist, weightshift to R      ROM / Strength   AROM / PROM / Strength  Strength      Strength   Strength Assessment Site  Hip;Knee;Ankle    Right Hip Flexion  4+/5    Right Hip Extension  4/5    Right Hip ABduction  4+/5    Left Hip Flexion  3/5    Left Hip Extension  3-/5    Left Hip ABduction  3/5    Right Knee Flexion  5/5    Right Knee Extension  5/5    Left Knee Flexion  3+/5    Left Knee Extension  3/5    Right Ankle Dorsiflexion  5/5    Right Ankle Plantar Flexion  4/5    Left Ankle Dorsiflexion  4/5    Left Ankle Plantar Flexion  2+/5   unable to perform single leg heel raise     Bed Mobility   Bed Mobility  --   requires increased time, verbal cues for sequencing     Ambulation/Gait   Ambulation/Gait  Yes    Ambulation/Gait Assistance  4: Min guard;6: Modified independent (Device/Increase time)    Ambulation Distance (Feet)  20 Feet    Assistive device  Straight cane    Gait Pattern  Decreased arm swing - left;Decreased step length - right;Decreased stance time - left;Decreased weight shift to left    Gait velocity  0.76ms    Gait Comments  increased L knee instability with initial steps, able to progress to hold SPC during ambulation      Balance   Balance Assessed  Yes      Static Standing Balance   Static Standing - Balance Support  No upper extremity supported    Static Standing Balance -  Activities   Single Leg Stance  - Right Leg;Single Leg Stance - Left Leg    Static Standing - Comment/# of Minutes  R: 12.5 sec, L: 4 sec      Functional Gait  Assessment   Gait assessed   Yes    Gait Level Surface  Walks 20 ft, slow speed, abnormal gait pattern, evidence for imbalance or deviates 10-15 in outside of the 12 in walkway width. Requires more than 7 sec to ambulate 20 ft.    Change in Gait Speed  Makes only minor adjustments to walking speed, or accomplishes a change in speed with significant gait deviations, deviates 10-15 in outside the 12 in walkway width, or changes speed but loses balance but is able to recover and continue walking.    Gait with Horizontal Head Turns  Performs head turns smoothly with slight change in gait velocity (eg, minor disruption to smooth gait path), deviates 6-10 in outside 12 in walkway width, or uses an assistive device.    Gait with Vertical Head Turns  Performs task with slight change in gait velocity (eg, minor disruption to smooth gait path), deviates 6 - 10 in outside 12 in walkway width or uses assistive device    Gait and Pivot Turn  Turns slowly, requires verbal cueing, or requires several small steps to catch balance following turn and stop    Step Over Obstacle  Is able to step over one shoe box (4.5 in total height) but must slow down and adjust steps to clear box safely. May require verbal cueing.    Gait with Narrow Base of Support  Ambulates less than 4 steps heel to toe or cannot perform without assistance.    Gait with Eyes Closed  Walks 20 ft, slow speed, abnormal gait pattern, evidence for imbalance, deviates 10-15 in outside 12 in walkway width. Requires more than 9 sec to ambulate 20 ft.    Ambulating Backwards  Walks 20 ft, slow speed, abnormal gait pattern, evidence for imbalance, deviates 10-15 in outside 12 in walkway width.    Steps  Alternating feet, must use rail.    Total Score  12        Objective measurements completed on examination: See above  findings.          PT Education - 06/06/19 1500    Education Details  Assessment findings, FOTO findings, initiated HEP, reviewed exercise technique, POC    Person(s) Educated  Patient    Methods  Explanation;Handout    Comprehension  Verbalized understanding       PT Short Term Goals - 06/06/19 1506      PT SHORT TERM GOAL #1   Title  Pt will consistently perform HEP at least 3x/week to improve strength, gait, and ability to perform recreational activities.    Time  3    Period  Weeks    Status  New    Target Date  06/27/19      PT SHORT TERM GOAL #2   Title  Pt will improve 5x STS test to 15 sec, no UE assist, no AD, and equal weightbearing throughout BLE to demo improved functional strength when rising from seated surfaces.    Time  3    Period  Weeks    Status  New  PT SHORT TERM GOAL #3   Title  Pt will ambulate 0.49ms with LRAD to reduce risk for falls and improve ability to navigate limited community distances.    Time  3    Period  Weeks    Status  New        PT Long Term Goals - 06/06/19 1507      PT LONG TERM GOAL #1   Title  Pt will demonstrate LLE 4/5 strength to reduce risk for falls, improve gait, improve transfers, and allow pt to return to normal recreational activities.    Time  7    Period  Weeks    Status  New    Target Date  07/25/19      PT LONG TERM GOAL #2   Title  Pt will ambulate 1.238m with LRAD to reduce risk for falls and improve ability to navigate throughout the community.    Time  7    Period  Weeks    Status  New      PT LONG TERM GOAL #3   Title  Pt will improve FGA to 19 to decrease risk for falls with yard work, working on cars, and vaEnvironmental health practitioner   Time  7    Period  Weeks    Status  New      PT LONG TERM GOAL #4   Title  Pt will perform SLS equally, bilaterally to improve gait pattern, stair negotiation, and reduce risk for falls with activity.    Time  7    Period  Weeks    Status  New              Plan - 06/06/19 1502    Clinical Impression Statement  Pt is a pleasant [id number] with CVA on 05/29/19. Objectively, pt with significant LLE weakness and mild coordination problems per heel-shin test. Pt demonstrates abnormal gait pattern, able to clear L toes from ground and no foot drop noted, but with 5-10 deg L knee buckling with initial steps, decreased L knee extension causing occasional L midfoot contact versus heel strike, and shorter R step length due to decreased L SLS time. Pt with decreased gait speed, increased transfer time, and scored 12 on the FGA, all indicating pt is at increased risk for falls. Pt denies any falls, but dose report heavy dependence on SPC/RW or reaching for furniture to catch self when falling. Pt would benefit from skilled PT interventions to improve deficits noted and establish HEP to improve independent with functional activity at home.    Personal Factors and Comorbidities  Comorbidity 2    Comorbidities  AAA, COPD, asthma    Examination-Activity Limitations  Bed Mobility;Locomotion Level;Stairs;Transfers    Examination-Participation Restrictions  Driving;Yard Work    StMerchant navy officerStable/Uncomplicated    ClDesigner, jewelleryLow    Rehab Potential  Good    PT Frequency  3x / week    PT Duration  Other (comment)   7 weeks due to 1 week vacation   PT Treatment/Interventions  ADLs/Self Care Home Management;Aquatic Therapy;Biofeedback;Electrical Stimulation;DME Instruction;Gait training;Stair training;Functional mobility training;Therapeutic activities;Therapeutic exercise;Balance training;Neuromuscular re-education;Patient/family education;Orthotic Fit/Training;Manual techniques;Passive range of motion;Dry needling;Taping;Joint Manipulations    PT Next Visit Plan  Review goals, HEP. LLE strengthening in weight-bearing. Static balance exercises, progressing to dynamic when able. Gait training, progress to no AD.    PT Home  Exercise Plan  Eval: sidelying hip abduction, bridges, LAQ with 5 sec hold  Consulted and Agree with Plan of Care  Patient;Family member/caregiver    Family Member Consulted  Spouse       Patient will benefit from skilled therapeutic intervention in order to improve the following deficits and impairments:  Abnormal gait, Decreased activity tolerance, Decreased balance, Decreased coordination, Decreased endurance, Decreased mobility, Decreased strength, Difficulty walking, Impaired perceived functional ability  Visit Diagnosis: Other symptoms and signs involving the nervous system  Unsteadiness on feet  Muscle weakness (generalized)  Difficulty in walking, not elsewhere classified     Problem List Patient Active Problem List   Diagnosis Date Noted  . CVA (cerebral vascular accident) ([place]) 05/29/2019  . Former smoker - Quit 2020 05/29/2019  . Aneurysm of thoracic aorta ([place]) 11/22/2017  . Hyperlipidemia 11/22/2017  . AAA (abdominal aortic aneurysm) without rupture ([place]) 11/22/2017  . Seasonal and perennial allergic rhinitis 04/19/2017  . Severe persistent asthma, uncomplicated 62/82/4175  . GERD (gastroesophageal reflux disease) 04/08/2016  . Chronic nonseasonal allergic rhinitis due to fungal spores 01/17/2014  . COPD, mild ([place]) 01/17/2014  . Mild chronic obstructive pulmonary disease ([place]) 01/17/2014     Talbot Grumbling PT, DPT 06/06/19, 3:14 PM [place] [address] [place], [place], [address] Phone: [phone]   Fax:  [phone]  Name: Caleb Evans MRN: [id number] Date of Birth: 1951-03-16

## 2019-06-07 ENCOUNTER — Ambulatory Visit (HOSPITAL_COMMUNITY): Payer: Medicare Other

## 2019-06-07 ENCOUNTER — Encounter (HOSPITAL_COMMUNITY): Payer: Self-pay

## 2019-06-07 ENCOUNTER — Ambulatory Visit (INDEPENDENT_AMBULATORY_CARE_PROVIDER_SITE_OTHER): Payer: Medicare Other | Admitting: *Deleted

## 2019-06-07 DIAGNOSIS — R262 Difficulty in walking, not elsewhere classified: Secondary | ICD-10-CM

## 2019-06-07 DIAGNOSIS — R2681 Unsteadiness on feet: Secondary | ICD-10-CM

## 2019-06-07 DIAGNOSIS — M6281 Muscle weakness (generalized): Secondary | ICD-10-CM

## 2019-06-07 DIAGNOSIS — R29818 Other symptoms and signs involving the nervous system: Secondary | ICD-10-CM

## 2019-06-07 DIAGNOSIS — J309 Allergic rhinitis, unspecified: Secondary | ICD-10-CM | POA: Diagnosis not present

## 2019-06-07 NOTE — Therapy (Signed)
[place], [place], [address] Phone: [phone]   Fax:  [phone]  Physical Therapy Treatment  Patient Details  Name: Caleb Evans MRN: [id number] Date of Birth: June 26, 1951 Referring Provider (PT): Irwin Brakeman, MD   Encounter Date: 06/07/2019  PT End of Session - 06/07/19 1626    Visit Number  2    Number of Visits  18    Date for PT Re-Evaluation  07/25/19    Authorization Type  Medicare; Secondary: Generic commercial    Authorization Time Period  06/06/19 to 07/28/19    Authorization - Visit Number  2    Authorization - Number of Visits  10    PT Start Time  6945    PT Stop Time  1700    PT Time Calculation (min)  39 min    Equipment Utilized During Treatment  Gait belt   CGA   Activity Tolerance  Patient tolerated treatment well    Behavior During Therapy  [facility] for tasks assessed/performed       Past Medical History:  Diagnosis Date  . AAA (abdominal aortic aneurysm) ([place])   . Adrenal nodule ([place])   . Asthma   . Bladder mass   . Bladder neoplasm   . Cancer [facility])    bladder cancer  . COPD (chronic obstructive pulmonary disease) ([place])   . Crohn's disease ([place])   . Diverticulitis   . GERD (gastroesophageal reflux disease)    takes Nexium off and on  . Over weight   . Skin lesion   . Thoracic aortic aneurysm ([place])   . Weak urinary stream     Past Surgical History:  Procedure Laterality Date  . APPENDECTOMY    . CHOLECYSTECTOMY    . LAPAROSCOPIC PARTIAL COLECTOMY  01/18/2018   ERAS PATHWAY  . LAPAROSCOPIC PARTIAL COLECTOMY N/A 01/18/2018   Procedure: LAPAROSCOPIC PARTIAL COLECTOMY ERAS PATHWAY;  Surgeon: Stark Klein, MD;  Location: [facility];  Service: General;  Laterality: N/A;  . TONSILLECTOMY      There were no vitals filed for this visit.  Subjective Assessment - 06/07/19 1624    Subjective  Pt stated he has been trying to walk with SPC around the house and has began the  exercises at home.  Main issue Lt UE strength and coordination, has OT eval next week.    Patient Stated Goals  to walk again    Currently in Pain?  No/denies                       [facility] Adult PT Treatment/Exercise - 06/07/19 0001      Exercises   Exercises  Knee/Hip      Knee/Hip Exercises: Standing   Heel Raises  10 reps    Heel Raises Limitations  Toe Raises 10x    Hip Abduction  Both;10 reps;Knee straight    Abduction Limitations  GTB around thigh    Rocker Board  2 minutes    Rocker Board Limitations  lateral and Df/Pf    SLS  3x      Knee/Hip Exercises: Seated   Sit to Sand  10 reps;without UE support   eccentric control         Balance Exercises - 06/07/19 1647      Balance Exercises: Standing   Tandem Stance  Eyes open;Eyes closed;Foam/compliant surface;3 reps;Intermittent upper extremity support;30 secs   initially solid floor then progressed to foam  SLS  Eyes open;3 reps   Rt 11", Rt 2-4" max    Sidestepping  2 reps;Vito Backers       PT Education - 06/07/19 1629    Education Details  Reviewed goals, reports compliance with HEP and able to verbalize exercises and form/mechanics.    Person(s) Educated  Patient    Methods  Explanation    Comprehension  Verbalized understanding;Returned demonstration       PT Short Term Goals - 06/06/19 1506      PT SHORT TERM GOAL #1   Title  Pt will consistently perform HEP at least 3x/week to improve strength, gait, and ability to perform recreational activities.    Time  3    Period  Weeks    Status  New    Target Date  06/27/19      PT SHORT TERM GOAL #2   Title  Pt will improve 5x STS test to 15 sec, no UE assist, no AD, and equal weightbearing throughout BLE to demo improved functional strength when rising from seated surfaces.    Time  3    Period  Weeks    Status  New      PT SHORT TERM GOAL #3   Title  Pt will ambulate 0.88ms with LRAD to reduce risk for falls and improve ability  to navigate limited community distances.    Time  3    Period  Weeks    Status  New        PT Long Term Goals - 06/06/19 1507      PT LONG TERM GOAL #1   Title  Pt will demonstrate LLE 4/5 strength to reduce risk for falls, improve gait, improve transfers, and allow pt to return to normal recreational activities.    Time  7    Period  Weeks    Status  New    Target Date  07/25/19      PT LONG TERM GOAL #2   Title  Pt will ambulate 1.213m with LRAD to reduce risk for falls and improve ability to navigate throughout the community.    Time  7    Period  Weeks    Status  New      PT LONG TERM GOAL #3   Title  Pt will improve FGA to 19 to decrease risk for falls with yard work, working on cars, and vaEnvironmental health practitioner   Time  7    Period  Weeks    Status  New      PT LONG TERM GOAL #4   Title  Pt will perform SLS equally, bilaterally to improve gait pattern, stair negotiation, and reduce risk for falls with activity.    Time  7    Period  Weeks    Status  New            Plan - 06/07/19 1709    Clinical Impression Statement  Began session reviewing goals and assuring compliance with HEP.  Pt able to recall and describe exercises completeing at home.  Session focus on LE strengthening and static balance activities.  Pt able to complete all exercises with minimal cueing required.  Good balance wiht solid floor tandem stance, increased difficulty with dynamic surfaces.  Lt knee gave out 2x on foam, pt able to recover wiht UE A.  [facility].  No reports of pain through session, was limited with fatigue at EOS.  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, jewellery  Low     Rehab Potential  Good    PT Frequency  3x / week    PT Duration  Other (comment)   7 weeks due to 1 week vacation   PT Treatment/Interventions  ADLs/Self Care Home Management;Aquatic Therapy;Biofeedback;Electrical Stimulation;DME Instruction;Gait training;Stair training;Functional mobility training;Therapeutic activities;Therapeutic exercise;Balance training;Neuromuscular re-education;Patient/family education;Orthotic Fit/Training;Manual techniques;Passive range of motion;Dry needling;Taping;Joint Manipulations    PT Next Visit Plan  Next session begin squats and step up training for LLE strengthening.  Continue closed chain exercises.  Static balance exercises, progressing to dynamic when able. Gait training, progress to no AD.    PT Home Exercise Plan  Eval: sidelying hip abduction, bridges, LAQ with 5 sec hold       Patient will benefit from skilled therapeutic intervention in order to improve the following deficits and impairments:  Abnormal gait, Decreased activity tolerance, Decreased balance, Decreased coordination, Decreased endurance, Decreased mobility, Decreased strength, Difficulty walking, Impaired perceived functional ability  Visit Diagnosis: Other symptoms and signs involving the nervous system  Unsteadiness on feet  Muscle weakness (generalized)  Difficulty in walking, not elsewhere classified     Problem List Patient Active Problem List   Diagnosis Date Noted  . CVA (cerebral vascular accident) ([place]) 05/29/2019  . Former smoker - Quit 2020 05/29/2019  . Aneurysm of thoracic aorta ([place]) 11/22/2017  . Hyperlipidemia 11/22/2017  . AAA (abdominal aortic aneurysm) without rupture ([place]) 11/22/2017  . Seasonal and perennial allergic rhinitis 04/19/2017  . Severe persistent asthma, uncomplicated 96/75/9163  . GERD (gastroesophageal reflux disease) 04/08/2016  . Chronic nonseasonal allergic rhinitis due to fungal spores 01/17/2014  . COPD, mild ([place]) 01/17/2014  . Mild  chronic obstructive pulmonary disease ([place]) 01/17/2014   Ihor Austin, LPTA; CBIS [phone]  Aldona Lento 06/07/2019, 5:15 PM  [place] [address] [place], [place], [address] Phone: [phone]   Fax:  [phone]  Name: Caleb Evans MRN: [id number] Date of Birth: 21-Oct-1950

## 2019-06-08 ENCOUNTER — Telehealth (HOSPITAL_COMMUNITY): Payer: Self-pay | Admitting: Physical Therapy

## 2019-06-08 ENCOUNTER — Ambulatory Visit (HOSPITAL_COMMUNITY): Payer: Medicare Other | Admitting: Physical Therapy

## 2019-06-08 NOTE — Telephone Encounter (Signed)
pt called to cancel due to the weather and a tree is blocking his driveway.

## 2019-06-12 ENCOUNTER — Telehealth: Payer: Self-pay | Admitting: Pulmonary Disease

## 2019-06-12 ENCOUNTER — Other Ambulatory Visit: Payer: Self-pay

## 2019-06-12 ENCOUNTER — Ambulatory Visit (INDEPENDENT_AMBULATORY_CARE_PROVIDER_SITE_OTHER): Payer: Medicare Other | Admitting: Cardiovascular Disease

## 2019-06-12 ENCOUNTER — Encounter: Payer: Self-pay | Admitting: Cardiovascular Disease

## 2019-06-12 ENCOUNTER — Ambulatory Visit (HOSPITAL_COMMUNITY): Payer: Medicare Other | Attending: Family Medicine | Admitting: Specialist

## 2019-06-12 VITALS — BP 112/69 | HR 72 | Temp 96.6°F | Ht 67.0 in | Wt 183.0 lb

## 2019-06-12 DIAGNOSIS — R262 Difficulty in walking, not elsewhere classified: Secondary | ICD-10-CM | POA: Insufficient documentation

## 2019-06-12 DIAGNOSIS — R2681 Unsteadiness on feet: Secondary | ICD-10-CM | POA: Insufficient documentation

## 2019-06-12 DIAGNOSIS — R29818 Other symptoms and signs involving the nervous system: Secondary | ICD-10-CM | POA: Insufficient documentation

## 2019-06-12 DIAGNOSIS — M6281 Muscle weakness (generalized): Secondary | ICD-10-CM | POA: Insufficient documentation

## 2019-06-12 DIAGNOSIS — R278 Other lack of coordination: Secondary | ICD-10-CM | POA: Insufficient documentation

## 2019-06-12 DIAGNOSIS — I639 Cerebral infarction, unspecified: Secondary | ICD-10-CM | POA: Diagnosis not present

## 2019-06-12 NOTE — Progress Notes (Signed)
_0  ID: Caleb Evans, male    DOB: 1951/05/02, 68 y.o.   MRN: [id number]  Chief Complaint  Patient presents with  . Follow-up    follow up after CVA ED admission, non productive cough, tickle in back of throat, started after hospital admission, questions if Nucala injection is causing bumps and dry skin patches    Referring provider: Theotis Burrow*  HPI:  68 year old male former smoker followed in our office for asthma COPD overlap syndrome.  Patient also is managed by allergy asthma Dr. Ernst Bowler. 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  PMH: GERD, history of CVA, history of AAA, hyperlipidemia, allergic rhinitis Smoker/ Smoking History: Former smoker.  Quit 2019.  150-pack-year smoking history. Maintenance: Dulera 100, Nucala Pt of: Dr. Vaughan Browner, interested in establishing with Dr. Valeta Harms   06/13/2019  - Visit   68 year old male former smoker followed in our office for asthma COPD overlap syndrome.  Patient is currently maintained on Nucala, allergy injections managed by Dr. Ernst Bowler, Symbicort 160.  Patient recently was discharged from the hospital after having a CVA.  Patient was started on a new blood pressure medication lisinopril.  Patient feels that he has had a worsened dry cough since then.  Patient also with increased postnasal drip with clear nasal discharge.  Patient occasionally has a productive cough in the morning with discolored sputum.  He reports that this is typically his baseline.  Patient with 150-pack-year smoking history.  Patient and spouse were interested in patient switching providers to Dr. Valeta Harms.  They have never met Dr. Valeta Harms they looked him up online and were interested in having a second opinion regarding his pulmonary care.  We can discuss this today as well as coordinate as the patient request.  Questionaires / Pulmonary Flowsheets:   ACT:   Asthma Control Test ACT Total Score  01/25/2019 13  01/13/2018 15    Tests:   Imaging CXR 03/25/2016 Abnormal markings at the lung bases with slight volume loss. This could represent chronic scarring/ fibrosis or mild bibasilar pneumonia.  CT scan 04/22/16 No evidence of interstitial lung disease, mild bronchiectasis bases, bronchial wall thickening, mild emphysema. Images reviewed.  Spirometry 04/08/16 FVC 2.68 [6%) FEV1 2.03 [72%), post bronchodilator 2.30 [82%], + 13% F/F 75 Minimal obstruction, positive bronchial dilator response  Allergy testing: Positive for trees, weeds, mold, dust mite.  FENO 04/09/16- 80 FENO 04/21/2018-43  Labs 04/29/16 Aspergillus ab - negative IgE- 81 CBC with diff- WBC count 12.3, absolute eosinophil count 1242/microL   FENO:  Lab Results  Component Value Date   NITRICOXIDE 43 04/21/2018    PFT: PFT Results Latest Ref Rng & Units 04/27/2018  FVC-Pre L 3.32  FVC-Predicted Pre % 81  FVC-Post L 3.47  FVC-Predicted Post % 84  Pre FEV1/FVC % % 70  Post FEV1/FCV % % 72  FEV1-Pre L 2.32  FEV1-Predicted Pre % 75  FEV1-Post L 2.50  DLCO UNC% % 70  DLCO COR %Predicted % 84  TLC L 5.80  TLC % Predicted % 90  RV % Predicted % 107    WALK:  SIX MIN WALK 06/13/2019  Supplimental Oxygen during Test? (L/min) No  Tech Comments: ambulated at steady pace    Imaging: Ct Angio Head W Or Wo Contrast  Result Date: 05/30/2019 CLINICAL DATA:  Follow-up stroke. Left-sided weakness beginning 05/28/2019. EXAM: CT ANGIOGRAPHY HEAD AND NECK TECHNIQUE: Multidetector CT imaging  of the head and neck was performed using the standard protocol during bolus administration of intravenous contrast. Multiplanar CT image reconstructions and MIPs were obtained to evaluate the vascular anatomy. Carotid stenosis measurements (when applicable) are obtained utilizing NASCET criteria, using the distal internal carotid diameter as the denominator. CONTRAST:  30m OMNIPAQUE  IOHEXOL 350 MG/ML SOLN COMPARISON:  Head CT yesterday. FINDINGS: CTA NECK FINDINGS Aortic arch: Atherosclerotic plaque of the thoracic aorta without aneurysm or dissection. Branching pattern is normal without brachiocephalic vessel origin stenosis. Right carotid system: Common carotid artery is widely patent to the bifurcation. Mild soft and calcified plaque at the carotid bifurcation and ICA bulb but no stenosis. Left carotid system: Common carotid artery widely patent to the bifurcation. Mild soft and calcified plaque at the carotid bifurcation and ICA bulb but no stenosis. Vertebral arteries: Both vertebral artery origins are widely patent. Right vertebral artery is slightly larger than the left. No stenosis in the cervical region. Skeleton: Ordinary degenerative spondylosis. Other neck: No mass or adenopathy. Upper chest: Emphysema. Scarring. No active process evident. Review of the MIP images confirms the above findings CTA HEAD FINDINGS Anterior circulation: Both internal carotid arteries are patent through the skull base and siphon regions. There is ordinary siphon atherosclerotic calcification but no stenosis. The anterior and middle cerebral vessels are patent without proximal stenosis, aneurysm or vascular malformation. No missing large or medium size vessels. Posterior circulation: Both vertebral arteries are patent through the foramen magnum to the basilar. No basilar stenosis. Posterior circulation branch vessels are normal. Primary anterior circulation supply to the posterior cerebral arteries. Venous sinuses: Patent and normal. Anatomic variants: None significant. Review of the MIP images confirms the above findings IMPRESSION: 1. Mild atherosclerotic change at both carotid bifurcations but without stenosis or irregularity. 2. No intracranial large or medium size vessel occlusion or correctable proximal stenosis. 3. Aortic atherosclerosis. 4. Emphysema. Aortic Atherosclerosis (ICD10-I70.0) and  Emphysema (ICD10-J43.9). Electronically Signed   By: MNelson ChimesM.D.   On: 05/30/2019 14:31   Ct Head Wo Contrast  Result Date: 05/29/2019 CLINICAL DATA:  Left-sided weakness. Awoke yesterday at 1100 hour with left-sided weakness. EXAM: CT HEAD WITHOUT CONTRAST TECHNIQUE: Contiguous axial images were obtained from the base of the skull through the vertex without intravenous contrast. COMPARISON:  None. FINDINGS: Brain: No intracranial hemorrhage, mass effect, or midline shift. Remote appearing lacunar infarct in the right basal ganglia. No evidence of acute ischemia. Age related atrophy. Mild periventricular white matter changes consistent with chronic small vessel ischemia. No hydrocephalus. The basilar cisterns are patent. No extra-axial or intracranial fluid collection. Vascular: Atherosclerosis of skullbase vasculature without hyperdense vessel or abnormal calcification. Skull: No fracture or focal lesion. Sinuses/Orbits: Paranasal sinuses and mastoid air cells are clear. The visualized orbits are unremarkable. Other: None. IMPRESSION: 1. No acute intracranial abnormality. 2. Age related atrophy and chronic small vessel ischemia. Remote appearing lacunar infarct in the right basal ganglia. Electronically Signed   By: MKeith RakeM.D.   On: 05/29/2019 05:30   Ct Angio Neck W Or Wo Contrast  Result Date: 05/30/2019 CLINICAL DATA:  Follow-up stroke. Left-sided weakness beginning 05/28/2019. EXAM: CT ANGIOGRAPHY HEAD AND NECK TECHNIQUE: Multidetector CT imaging of the head and neck was performed using the standard protocol during bolus administration of intravenous contrast. Multiplanar CT image reconstructions and MIPs were obtained to evaluate the vascular anatomy. Carotid stenosis measurements (when applicable) are obtained utilizing NASCET criteria, using the distal internal carotid diameter as the denominator. CONTRAST:  746mOMNIPAQUE  IOHEXOL 350 MG/ML SOLN COMPARISON:  Head CT yesterday.  FINDINGS: CTA NECK FINDINGS Aortic arch: Atherosclerotic plaque of the thoracic aorta without aneurysm or dissection. Branching pattern is normal without brachiocephalic vessel origin stenosis. Right carotid system: Common carotid artery is widely patent to the bifurcation. Mild soft and calcified plaque at the carotid bifurcation and ICA bulb but no stenosis. Left carotid system: Common carotid artery widely patent to the bifurcation. Mild soft and calcified plaque at the carotid bifurcation and ICA bulb but no stenosis. Vertebral arteries: Both vertebral artery origins are widely patent. Right vertebral artery is slightly larger than the left. No stenosis in the cervical region. Skeleton: Ordinary degenerative spondylosis. Other neck: No mass or adenopathy. Upper chest: Emphysema. Scarring. No active process evident. Review of the MIP images confirms the above findings CTA HEAD FINDINGS Anterior circulation: Both internal carotid arteries are patent through the skull base and siphon regions. There is ordinary siphon atherosclerotic calcification but no stenosis. The anterior and middle cerebral vessels are patent without proximal stenosis, aneurysm or vascular malformation. No missing large or medium size vessels. Posterior circulation: Both vertebral arteries are patent through the foramen magnum to the basilar. No basilar stenosis. Posterior circulation branch vessels are normal. Primary anterior circulation supply to the posterior cerebral arteries. Venous sinuses: Patent and normal. Anatomic variants: None significant. Review of the MIP images confirms the above findings IMPRESSION: 1. Mild atherosclerotic change at both carotid bifurcations but without stenosis or irregularity. 2. No intracranial large or medium size vessel occlusion or correctable proximal stenosis. 3. Aortic atherosclerosis. 4. Emphysema. Aortic Atherosclerosis (ICD10-I70.0) and Emphysema (ICD10-J43.9). Electronically Signed   By: Nelson Chimes M.D.   On: 05/30/2019 14:31   US Carotid Bilateral (at [facility] Only)  Result Date: 05/29/2019 CLINICAL DATA:  TIA. EXAM: BILATERAL CAROTID DUPLEX ULTRASOUND TECHNIQUE: Pearline Cables scale imaging, color Doppler and duplex ultrasound were performed of bilateral carotid and vertebral arteries in the neck. COMPARISON:  None. FINDINGS: Criteria: Quantification of carotid stenosis is based on velocity parameters that correlate the residual internal carotid diameter with NASCET-based stenosis levels, using the diameter of the distal internal carotid lumen as the denominator for stenosis measurement. The following velocity measurements were obtained: RIGHT ICA: 78/27 cm/sec CCA: 93/23 cm/sec SYSTOLIC ICA/CCA RATIO:  1.0 ECA: 76 cm/sec LEFT ICA: 81/31 cm/sec CCA: 55/73 cm/sec SYSTOLIC ICA/CCA RATIO:  1.0 ECA: 81 cm/sec RIGHT CAROTID ARTERY: There is a minimal amount of eccentric mixed echogenic plaque within the right carotid bulb (images 21 and 23), not resulting in elevated peak systolic velocities within the interrogated course the right internal carotid artery to suggest a hemodynamically significant stenosis. RIGHT VERTEBRAL ARTERY:  Antegrade flow LEFT CAROTID ARTERY: There is a minimal amount of eccentric echogenic plaque within the left carotid bulb (images 66 and 68), not resulting in elevated peak systolic velocities within the interrogated course the left internal carotid artery to suggest a hemodynamically significant stenosis. LEFT VERTEBRAL ARTERY:  Antegrade flow IMPRESSION: Minimal amount of bilateral atherosclerotic plaque, not resulting in a hemodynamically significant stenosis within either internal carotid artery. Electronically Signed   By: Sandi Mariscal M.D.   On: 05/29/2019 12:29    Lab Results:  CBC    Component Value Date/Time   WBC 9.2 05/29/2019 0456   RBC 4.68 05/29/2019 0456   HGB 14.4 05/29/2019 0456   HCT 43.4 05/29/2019 0456   PLT 362 05/29/2019 0456   MCV 92.7 05/29/2019  0456   MCH 30.8 05/29/2019 0456  MCHC 33.2 05/29/2019 0456   RDW 14.1 05/29/2019 0456   LYMPHSABS 3.4 05/29/2019 0456   MONOABS 0.5 05/29/2019 0456   EOSABS 0.1 05/29/2019 0456   BASOSABS 0.1 05/29/2019 0456    BMET    Component Value Date/Time   NA 139 05/30/2019 0602   K 3.7 05/30/2019 0602   CL 108 05/30/2019 0602   CO2 23 05/30/2019 0602   GLUCOSE 105 (H) 05/30/2019 0602   BUN 10 05/30/2019 0602   CREATININE 0.84 05/30/2019 0602   CALCIUM 8.6 (L) 05/30/2019 0602   GFRNONAA >60 05/30/2019 0602   GFRAA >60 05/30/2019 0602    BNP No results found for: BNP  ProBNP No results found for: PROBNP  Specialty Problems      Pulmonary Problems   Asthma-COPD overlap syndrome (HCC)   Chronic nonseasonal allergic rhinitis due to fungal spores   Mild chronic obstructive pulmonary disease (HCC)   Severe persistent asthma, uncomplicated   Seasonal and perennial allergic rhinitis      Allergies  Allergen Reactions  . Chlorpheniramine Other (See Comments)    Sinus congestion  . Amoxicillin-Pot Clavulanate Other (See Comments)    UNSPECIFIED REACTION  Has patient had a PCN reaction causing immediate rash, facial/tongue/throat swelling, SOB or lightheadedness with hypotension: No Has patient had a PCN reaction causing severe rash involving mucus membranes or skin necrosis: No Has patient had a PCN reaction that required hospitalization: No Has patient had a PCN reaction occurring within the last 10 years: Yes If all of the above answers are "NO", then may proceed with Cephalosporin use.  Caused liver failure     Immunization History  Administered Date(s) Administered  . Influenza Split 05/11/2015  . Influenza, High Dose Seasonal PF 05/12/2019    Past Medical History:  Diagnosis Date  . AAA (abdominal aortic aneurysm) ([place])   . Adrenal nodule ([place])   . Asthma   . Bladder mass   . Bladder neoplasm   . Cancer [facility])    bladder cancer  . COPD (chronic obstructive  pulmonary disease) ([place])   . Crohn's disease ([place])   . Diverticulitis   . GERD (gastroesophageal reflux disease)    takes Nexium off and on  . Over weight   . Skin lesion   . Thoracic aortic aneurysm ([place])   . Weak urinary stream     Tobacco History: Social History   Tobacco Use  Smoking Status Former Smoker  . Packs/day: 3.00  . Years: 50.00  . Pack years: 150.00  . Types: Cigarettes  . Quit date: 04/10/2018  . Years since quitting: 1.1  Smokeless Tobacco Never Used   Counseling given: Not Answered   Continue to not smoke  Outpatient Encounter Medications as of 06/13/2019  Medication Sig  . aspirin 81 MG chewable tablet Chew 1 tablet (81 mg total) by mouth daily.  Marland Kitchen atorvastatin (LIPITOR) 80 MG tablet Take 80 mg by mouth at bedtime.  . Calcium Carb-Cholecalciferol (CALCIUM 600+D3 PO) Take 1 tablet by mouth daily.  . Fluticasone Propionate (XHANCE) 93 MCG/ACT EXHU Place 2 puffs into the nose 2 (two) times daily.  Marland Kitchen ipratropium-albuterol (DUONEB) 0.5-2.5 (3) MG/3ML SOLN Take 3 mLs by nebulization every 4 (four) hours as needed.  Marland Kitchen lisinopril (ZESTRIL) 20 MG tablet Take 1 tablet (20 mg total) by mouth daily.  . Mepolizumab (NUCALA) 100 MG/ML SOAJ Inject into the skin.  . mometasone-formoterol (DULERA) 100-5 MCG/ACT AERO Inhale 2 puffs into the lungs 2 (two) times daily.  . Multiple  Vitamin (MULTIVITAMIN WITH MINERALS) TABS tablet Take 1 tablet by mouth daily.  Marland Kitchen PROAIR HFA 108 (90 Base) MCG/ACT inhaler INHALE 2 PUFFS EVERY 4 TO 6 HOURS AS NEEDED.  . [DISCONTINUED] azelastine (ASTELIN) 0.1 % nasal spray Place 2 sprays into both nostrils 2 (two) times daily as needed for up to 30 days for rhinitis. Use in each nostril as directed  . [DISCONTINUED] Olopatadine HCl (PATADAY) 0.2 % SOLN Place 1 drop into both eyes daily as needed (for irritated/allergy eyes.). (Patient not taking: Reported on 01/30/2019)   Facility-Administered Encounter Medications as of 06/13/2019  Medication  .  clindamycin (CLEOCIN) 900 mg in dextrose 5 % 50 mL IVPB   And  . gentamicin (GARAMYCIN) 390 mg in dextrose 5 % 50 mL IVPB  . Mepolizumab SOLR 100 mg  . Mepolizumab SOLR 100 mg  . Mepolizumab SOLR 100 mg  . Mepolizumab SOLR 100 mg  . Mepolizumab SOLR 100 mg  . Mepolizumab SOLR 100 mg  . Mepolizumab SOLR 100 mg     Review of Systems  Review of Systems  Constitutional: Positive for fatigue. Negative for activity change, diaphoresis and fever.  HENT: Positive for congestion, postnasal drip and sinus pressure. Negative for sneezing, sore throat and trouble swallowing.   Eyes: Negative.   Respiratory: Positive for cough (morning cough - productive green 1-2 weeks, clear during the day), shortness of breath and wheezing.   Cardiovascular: Negative for chest pain and palpitations.  Gastrointestinal: Negative for diarrhea, nausea and vomiting.  Endocrine: Negative.   Genitourinary: Negative.   Musculoskeletal: Negative.  Negative for arthralgias.  Skin: Negative.   Allergic/Immunologic: Positive for environmental allergies and food allergies.       Followed by Dr. Ernst Bowler  Neurological: Negative.   Hematological: Negative.   Psychiatric/Behavioral: Negative.  Negative for dysphoric mood. The patient is not nervous/anxious.       Physical Exam  BP 118/70 (BP Location: Left Arm, Cuff Size: Normal)   Pulse 65   Ht _0  (1.702 m)   Wt 181 lb (82.1 kg)   SpO2 97%   BMI 28.35 kg/m   Wt Readings from Last 5 Encounters:  06/13/19 181 lb (82.1 kg)  06/12/19 183 lb (83 kg)  05/29/19 175 lb (79.4 kg)  01/30/19 181 lb (82.1 kg)  01/25/19 183 lb 3.2 oz (83.1 kg)    BMI Readings from Last 5 Encounters:  06/13/19 28.35 kg/m  06/12/19 28.66 kg/m  05/29/19 27.41 kg/m  01/30/19 27.93 kg/m  01/25/19 28.27 kg/m     Physical Exam Vitals signs and nursing note reviewed.  Constitutional:      General: He is not in acute distress.    Appearance: Normal appearance. He is obese.   HENT:     Head: Normocephalic and atraumatic.     Right Ear: Hearing, tympanic membrane, ear canal and external ear normal.     Left Ear: Hearing, tympanic membrane, ear canal and external ear normal.     Nose: Congestion and rhinorrhea present. No mucosal edema.     Right Turbinates: Not enlarged.     Left Turbinates: Not enlarged.     Mouth/Throat:     Pharynx: Oropharynx is clear. No oropharyngeal exudate or posterior oropharyngeal erythema.     Comments: Postnasal drip Eyes:     Pupils: Pupils are equal, round, and reactive to light.  Neck:     Musculoskeletal: Normal range of motion.  Cardiovascular:     Rate and Rhythm: Normal rate and regular  rhythm.     Pulses: Normal pulses.     Heart sounds: Normal heart sounds. No murmur.  Pulmonary:     Effort: Pulmonary effort is normal.     Breath sounds: Normal breath sounds. No decreased breath sounds, wheezing or rales.  Abdominal:     General: Bowel sounds are normal. There is no distension.     Palpations: Abdomen is soft.     Tenderness: There is no abdominal tenderness.  Musculoskeletal:     Right lower leg: No edema.     Left lower leg: No edema.  Lymphadenopathy:     Cervical: No cervical adenopathy.  Skin:    General: Skin is warm and dry.     Capillary Refill: Capillary refill takes less than 2 seconds.     Findings: No erythema.     Comments: Dry skin, eczema on lower extremities  Neurological:     General: No focal deficit present.     Mental Status: He is alert and oriented to person, place, and time.     Motor: No weakness.     Coordination: Coordination normal.     Gait: Gait is intact. Gait normal.  Psychiatric:        Mood and Affect: Mood normal.        Behavior: Behavior normal. Behavior is cooperative.        Thought Content: Thought content normal.        Judgment: Judgment normal.       Assessment & Plan:   CVA (cerebral vascular accident) ([place]) Plan: Continue to follow-up with primary care  Follow-up with primary care regarding your recent lisinopril prescription as I believe this likely should be switched to an ARB  Asthma-COPD overlap syndrome ([place]) Likely asthma COPD overlap syndrome 150-pack-year smoking history Significantly elevated eosinophils on 2019 blood work Managed on [place] Also receiving allergy injections from Dr. Gillermina Hu office Persistent rhinorrhea Postnasal drip  Plan: Lab work today >>> We may need to consider different biologic therapies such as Dupixent which may be more appropriate for the patient as he has atopic features such as eczema as well as persistent chronic rhinitis Reestablish with Dr. Ernst Bowler with allergy asthma for an in person evaluation Continue Symbicort 160 We will work to get you established with Dr. Valeta Harms in our office in a 30-minute slot Walk today in office, patient tolerated without any oxygen desaturations  Seasonal and perennial allergic rhinitis Plan: Reestablish with Dr. Ernst Bowler Continue immunotherapy as outlined by allergy asthma Continue nasal medications as managed by Dr. Ernst Bowler Lab work today   Former smoker Plan: Continue lung cancer screening program, repeat CT in May/2021 Continue to not smoke  Medication management I believe patient's increased cough is likely multifactorial due to persistent chronic rhinitis symptoms as well as new ACE inhibitor the patient was placed on after hospitalization.  Plan: Follow-up with primary care regarding stopping her ACE inhibitor and switching to a different blood pressure medication such as losartan  Lab work today to see if patient is having persistent eosinophilia despite Nucala  May need to consider other biologic therapy such as Dupixent  Patient also needs to reestablish for an office visit with Dr. Gillermina Hu office    Return in about 4 weeks (around 07/11/2019), or if symptoms worsen or fail to improve, for Follow up with Dr. Valeta Harms - 21mn former  PM pt.   BLauraine Rinne NP 06/13/2019   This appointment was 32 minutes long with over 50% of the time  in direct face-to-face patient care, assessment, plan of care, and follow-up.

## 2019-06-12 NOTE — Telephone Encounter (Signed)
Duplicate message. Will close encounter.

## 2019-06-12 NOTE — Telephone Encounter (Signed)
Ok with me 

## 2019-06-12 NOTE — Patient Instructions (Addendum)
Medication Instructions:  Your physician recommends that you continue on your current medications as directed. Please refer to the Current Medication list given to you today.   Labwork: none  Testing/Procedures: none  Follow-Up: Your physician recommends that you schedule a follow-up appointment in: asap with Dr. Lovena Le    Any Other Special Instructions Will Be Listed Below (If Applicable).  You have been referred to DR. Lovena Le (CARDIAC  ELECTROPHYSIOLOGY)    If you need a refill on your cardiac medications before your next appointment, please call your pharmacy.

## 2019-06-12 NOTE — Progress Notes (Signed)
CARDIOLOGY CONSULT NOTE       Patient ID: Caleb Evans MRN: [id number] DOB/AGE: Jan 06, 1951 67 y.o.  Admit date: (Not on file) Referring Physician: Lonna Cobb [place] Primary Physician: Theotis Burrow, MD Primary Cardiologist: New Reason for Consultation: CVA    HPI: 68 y.[facility]. [facility] for CVA. History of heavy smoking, COPD, Asthma, AAA bladder cancer Crohn's and GERD. Had left arm and leg numbness and ? Ataxia Echo reviewed from 05/29/19 was normal EF 55-60% Carotids with plaque no stenosis June 2020 US showed no real AAA maximum measurement 2.1 cm   CT head no acute finding CTA mild atherosclerosis no obstructive disease  EEG was normal MRI  Per Dr Merlene Laughter remote Lacuar infarct right putamen   He has no cardiac history Quit smoking over a year ago and has emphysema ill be seeing [facility] in [facility]. No history of arrhythmias or PAF. No palpitations chest pain. Mild chronic dyspnea due to COPD  ROS All other systems reviewed and negative except as noted above  Past Medical History:  Diagnosis Date   AAA (abdominal aortic aneurysm) (HCC)    Adrenal nodule (HCC)    Asthma    Bladder mass    Bladder neoplasm    Cancer (HCC)    bladder cancer   COPD (chronic obstructive pulmonary disease) (HCC)    Crohn's disease (HCC)    Diverticulitis    GERD (gastroesophageal reflux disease)    takes Nexium off and on   Over weight    Skin lesion    Thoracic aortic aneurysm (HCC)    Weak urinary stream     Family History  Adopted: Yes  Problem Relation Age of Onset   Bladder Cancer Neg Hx    Kidney cancer Neg Hx    Prostate cancer Neg Hx    Allergic rhinitis Neg Hx    Angioedema Neg Hx    Asthma Neg Hx    Eczema Neg Hx    Urticaria Neg Hx    Immunodeficiency Neg Hx     Social History   Socioeconomic History   Marital status: Married    Spouse name: Not on file   Number of children: Not on file   Years  of education: Not on file   Highest education level: Not on file  Occupational History   Not on file  Social Needs   Financial resource strain: Not on file   Food insecurity    Worry: Not on file    Inability: Not on file   Transportation needs    Medical: Not on file    Non-medical: Not on file  Tobacco Use   Smoking status: Former Smoker    Packs/day: 3.00    Years: 50.00    Pack years: 150.00    Types: Cigarettes    Quit date: 04/10/2018    Years since quitting: 1.1   Smokeless tobacco: Never Used  Substance and Sexual Activity   Alcohol use: Yes    Alcohol/week: 0.0 standard drinks    Comment: occasional   Drug use: No   Sexual activity: Not on file  Lifestyle   Physical activity    Days per week: Not on file    Minutes per session: Not on file   Stress: Not on file  Relationships   Social connections    Talks on phone: Not on file    Gets together: Not on file    Attends religious service: Not on file  Active member of club or organization: Not on file    Attends meetings of clubs or organizations: Not on file    Relationship status: Not on file   Intimate partner violence    Fear of current or ex partner: Not on file    Emotionally abused: Not on file    Physically abused: Not on file    Forced sexual activity: Not on file  Other Topics Concern   Not on file  Social History Narrative   Patient is adopted   Married, lives with spouse   2 children (boy and girl)   OCCUPATION: Dealer x66yr.  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   Past Surgical History:  Procedure Laterality Date   APPENDECTOMY     CHOLECYSTECTOMY     LAPAROSCOPIC PARTIAL COLECTOMY  01/18/2018   [place]   [place]  Surgeon: BStark Klein MD;  [place] [facility]  Service: General;  Laterality: N/A;   TONSILLECTOMY         Mepolizumab  100 mg Subcutaneous Q28 days   Mepolizumab  100 mg Subcutaneous Q28 days   Mepolizumab  100 mg Subcutaneous Q28 days   Mepolizumab  100 mg Subcutaneous Q28 days   Mepolizumab  100 mg Subcutaneous Q28 days   Mepolizumab  100 mg Subcutaneous Q28 days   Mepolizumab  100 mg Subcutaneous Q28 days     Physical Exam: Blood pressure 112/69, pulse 72, temperature (!) 96.6 F (35.9 C), height 5' 7"  (1.702 m), weight 183 lb (83 kg), SpO2 95 %.    Affect appropriate Healthy:  appears stated age H22 normal Neck supple with no adenopathy JVP normal no bruits no thyromegaly Lungs clear with no wheezing and good diaphragmatic motion Heart:  S1/S2 no murmur, no rub, gallop or click PMI normal Abdomen: benighn, BS positve, no tenderness, no AAA no bruit.  No HSM or HJR Distal pulses intact with no bruits No edema Neuro non-focal Skin warm and dry No muscular weakness   Labs:   Lab Results  Component Value Date   WBC 9.2 05/29/2019   HGB 14.4 05/29/2019   HCT 43.4 05/29/2019   MCV 92.7 05/29/2019   PLT 362 05/29/2019   No results for input(s): NA, K, CL, CO2, BUN, CREATININE, CALCIUM, PROT, BILITOT, ALKPHOS, ALT, AST, GLUCOSE in the last 168 hours.  Invalid input(s): LABALBU No results found for: CKTOTAL, CKMB, CKMBINDEX, TROPONINI  Lab Results  Component Value Date   CHOL 135 05/29/2019   Lab Results  Component Value Date   HDL 33 (L) 05/29/2019   Lab Results  Component Value Date   LDLCALC 50 05/29/2019   Lab Results  Component Value Date   TRIG 259 (H) 05/29/2019   Lab Results  Component Value Date   CHOLHDL 4.1 05/29/2019   No results found for: LDLDIRECT    Radiology: Ct Angio Head W Or Wo Contrast  Result Date: 05/30/2019 CLINICAL DATA:  Follow-up stroke. Left-sided weakness beginning 05/28/2019. EXAM: CT ANGIOGRAPHY HEAD AND NECK TECHNIQUE: Multidetector CT imaging of the head and neck was performed using the standard protocol during  bolus administration of intravenous contrast. Multiplanar CT image reconstructions and MIPs were obtained to evaluate the vascular anatomy. Carotid stenosis measurements (when applicable) are obtained utilizing NASCET criteria, using the distal internal carotid diameter as the denominator. CONTRAST:  792mOMNIPAQUE IOHEXOL 350 MG/ML  SOLN COMPARISON:  Head CT yesterday. FINDINGS: CTA NECK FINDINGS Aortic arch: Atherosclerotic plaque of the thoracic aorta without aneurysm or dissection. Branching pattern is normal without brachiocephalic vessel origin stenosis. Right carotid system: Common carotid artery is widely patent to the bifurcation. Mild soft and calcified plaque at the carotid bifurcation and ICA bulb but no stenosis. Left carotid system: Common carotid artery widely patent to the bifurcation. Mild soft and calcified plaque at the carotid bifurcation and ICA bulb but no stenosis. Vertebral arteries: Both vertebral artery origins are widely patent. Right vertebral artery is slightly larger than the left. No stenosis in the cervical region. Skeleton: Ordinary degenerative spondylosis. Other neck: No mass or adenopathy. Upper chest: Emphysema. Scarring. No active process evident. Review of the MIP images confirms the above findings CTA HEAD FINDINGS Anterior circulation: Both internal carotid arteries are patent through the skull base and siphon regions. There is ordinary siphon atherosclerotic calcification but no stenosis. The anterior and middle cerebral vessels are patent without proximal stenosis, aneurysm or vascular malformation. No missing large or medium size vessels. Posterior circulation: Both vertebral arteries are patent through the foramen magnum to the basilar. No basilar stenosis. Posterior circulation branch vessels are normal. Primary anterior circulation supply to the posterior cerebral arteries. Venous sinuses: Patent and normal. Anatomic variants: None significant. Review of the MIP images  confirms the above findings IMPRESSION: 1. Mild atherosclerotic change at both carotid bifurcations but without stenosis or irregularity. 2. No intracranial large or medium size vessel occlusion or correctable proximal stenosis. 3. Aortic atherosclerosis. 4. Emphysema. Aortic Atherosclerosis (ICD10-I70.0) and Emphysema (ICD10-J43.9). Electronically Signed   By: Nelson Chimes M.D.   On: 05/30/2019 14:31   Ct Head Wo Contrast  Result Date: 05/29/2019 CLINICAL DATA:  Left-sided weakness. Awoke yesterday at 1100 hour with left-sided weakness. EXAM: CT HEAD WITHOUT CONTRAST TECHNIQUE: Contiguous axial images were obtained from the base of the skull through the vertex without intravenous contrast. COMPARISON:  None. FINDINGS: Brain: No intracranial hemorrhage, mass effect, or midline shift. Remote appearing lacunar infarct in the right basal ganglia. No evidence of acute ischemia. Age related atrophy. Mild periventricular white matter changes consistent with chronic small vessel ischemia. No hydrocephalus. The basilar cisterns are patent. No extra-axial or intracranial fluid collection. Vascular: Atherosclerosis of skullbase vasculature without hyperdense vessel or abnormal calcification. Skull: No fracture or focal lesion. Sinuses/Orbits: Paranasal sinuses and mastoid air cells are clear. The visualized orbits are unremarkable. Other: None. IMPRESSION: 1. No acute intracranial abnormality. 2. Age related atrophy and chronic small vessel ischemia. Remote appearing lacunar infarct in the right basal ganglia. Electronically Signed   By: Keith Rake M.D.   On: 05/29/2019 05:30   Ct Angio Neck W Or Wo Contrast  Result Date: 05/30/2019 CLINICAL DATA:  Follow-up stroke. Left-sided weakness beginning 05/28/2019. EXAM: CT ANGIOGRAPHY HEAD AND NECK TECHNIQUE: Multidetector CT imaging of the head and neck was performed using the standard protocol during bolus administration of intravenous contrast. Multiplanar CT  image reconstructions and MIPs were obtained to evaluate the vascular anatomy. Carotid stenosis measurements (when applicable) are obtained utilizing NASCET criteria, using the distal internal carotid diameter as the denominator. CONTRAST:  70m OMNIPAQUE IOHEXOL 350 MG/ML SOLN COMPARISON:  Head CT yesterday. FINDINGS: CTA NECK FINDINGS Aortic arch: Atherosclerotic plaque of the thoracic aorta without aneurysm or dissection. Branching pattern is normal without brachiocephalic vessel origin stenosis. Right carotid system: Common carotid artery is widely patent to the bifurcation. Mild soft and calcified plaque at the carotid bifurcation and  ICA bulb but no stenosis. Left carotid system: Common carotid artery widely patent to the bifurcation. Mild soft and calcified plaque at the carotid bifurcation and ICA bulb but no stenosis. Vertebral arteries: Both vertebral artery origins are widely patent. Right vertebral artery is slightly larger than the left. No stenosis in the cervical region. Skeleton: Ordinary degenerative spondylosis. Other neck: No mass or adenopathy. Upper chest: Emphysema. Scarring. No active process evident. Review of the MIP images confirms the above findings CTA HEAD FINDINGS Anterior circulation: Both internal carotid arteries are patent through the skull base and siphon regions. There is ordinary siphon atherosclerotic calcification but no stenosis. The anterior and middle cerebral vessels are patent without proximal stenosis, aneurysm or vascular malformation. No missing large or medium size vessels. Posterior circulation: Both vertebral arteries are patent through the foramen magnum to the basilar. No basilar stenosis. Posterior circulation branch vessels are normal. Primary anterior circulation supply to the posterior cerebral arteries. Venous sinuses: Patent and normal. Anatomic variants: None significant. Review of the MIP images confirms the above findings IMPRESSION: 1. Mild  atherosclerotic change at both carotid bifurcations but without stenosis or irregularity. 2. No intracranial large or medium size vessel occlusion or correctable proximal stenosis. 3. Aortic atherosclerosis. 4. Emphysema. Aortic Atherosclerosis (ICD10-I70.0) and Emphysema (ICD10-J43.9). Electronically Signed   By: Nelson Chimes M.D.   On: 05/30/2019 14:31   US Carotid Bilateral (at [facility] Only)  Result Date: 05/29/2019 CLINICAL DATA:  TIA. EXAM: BILATERAL CAROTID DUPLEX ULTRASOUND TECHNIQUE: Pearline Cables scale imaging, color Doppler and duplex ultrasound were performed of bilateral carotid and vertebral arteries in the neck. COMPARISON:  None. FINDINGS: Criteria: Quantification of carotid stenosis is based on velocity parameters that correlate the residual internal carotid diameter with NASCET-based stenosis levels, using the diameter of the distal internal carotid lumen as the denominator for stenosis measurement. The following velocity measurements were obtained: RIGHT ICA: 78/27 cm/sec CCA: 93/71 cm/sec SYSTOLIC ICA/CCA RATIO:  1.0 ECA: 76 cm/sec LEFT ICA: 81/31 cm/sec CCA: 69/67 cm/sec SYSTOLIC ICA/CCA RATIO:  1.0 ECA: 81 cm/sec RIGHT CAROTID ARTERY: There is a minimal amount of eccentric mixed echogenic plaque within the right carotid bulb (images 21 and 23), not resulting in elevated peak systolic velocities within the interrogated course the right internal carotid artery to suggest a hemodynamically significant stenosis. RIGHT VERTEBRAL ARTERY:  Antegrade flow LEFT CAROTID ARTERY: There is a minimal amount of eccentric echogenic plaque within the left carotid bulb (images 66 and 68), not resulting in elevated peak systolic velocities within the interrogated course the left internal carotid artery to suggest a hemodynamically significant stenosis. LEFT VERTEBRAL ARTERY:  Antegrade flow IMPRESSION: Minimal amount of bilateral atherosclerotic plaque, not resulting in a hemodynamically significant stenosis  within either internal carotid artery. Electronically Signed   By: Sandi Mariscal M.D.   On: 05/29/2019 12:29    EKG: SR rate 62 nonspecific inferior ST segment changes    ASSESSMENT AND PLAN:   1. CVA:  In regard to his heart recommend f/u with EP to consider ILR r/o occult PAF  2. COPD:  Continue inhalers f/u [facility] Negative lung cancer screening CT Dec 31 2018  3. HLD:  Continue statin labs with primary   Signed: Jenkins Rouge 06/12/2019, 2:30 PM

## 2019-06-12 NOTE — Telephone Encounter (Signed)
Dr. Valeta Harms please advise if you're ok with taking on this patient.  Thanks!

## 2019-06-12 NOTE — Telephone Encounter (Signed)
Call returned to patient wife, she states her husband recently had a stroke about 2 weeks ago. They are requesting to switch providers. Would like to switch to Dr. Valeta Harms. In the mean time she would like to have him come in and be seen. She states he had a stroke about 2 weeks ago and she just wants him to be checked out. Covid screen negative. Appt made with NP. Aware of current address.   Aware of office policy regarding provider switch. Voiced understanding.   Dr. Vaughan Browner pt requesting to switch to [facility].  Okay to switch.

## 2019-06-13 ENCOUNTER — Ambulatory Visit (HOSPITAL_COMMUNITY): Payer: Medicare Other

## 2019-06-13 ENCOUNTER — Ambulatory Visit (INDEPENDENT_AMBULATORY_CARE_PROVIDER_SITE_OTHER): Payer: Medicare Other | Admitting: Pulmonary Disease

## 2019-06-13 ENCOUNTER — Encounter (HOSPITAL_COMMUNITY): Payer: Self-pay

## 2019-06-13 ENCOUNTER — Ambulatory Visit (HOSPITAL_COMMUNITY): Payer: Medicare Other | Admitting: Specialist

## 2019-06-13 ENCOUNTER — Encounter: Payer: Self-pay | Admitting: Pulmonary Disease

## 2019-06-13 ENCOUNTER — Encounter (HOSPITAL_COMMUNITY): Payer: Self-pay | Admitting: Specialist

## 2019-06-13 VITALS — BP 118/70 | HR 65 | Ht 67.0 in | Wt 181.0 lb

## 2019-06-13 DIAGNOSIS — J3089 Other allergic rhinitis: Secondary | ICD-10-CM | POA: Diagnosis not present

## 2019-06-13 DIAGNOSIS — R278 Other lack of coordination: Secondary | ICD-10-CM | POA: Diagnosis present

## 2019-06-13 DIAGNOSIS — R29818 Other symptoms and signs involving the nervous system: Secondary | ICD-10-CM | POA: Diagnosis not present

## 2019-06-13 DIAGNOSIS — J449 Chronic obstructive pulmonary disease, unspecified: Secondary | ICD-10-CM

## 2019-06-13 DIAGNOSIS — J302 Other seasonal allergic rhinitis: Secondary | ICD-10-CM | POA: Diagnosis not present

## 2019-06-13 DIAGNOSIS — R2681 Unsteadiness on feet: Secondary | ICD-10-CM

## 2019-06-13 DIAGNOSIS — R262 Difficulty in walking, not elsewhere classified: Secondary | ICD-10-CM | POA: Diagnosis present

## 2019-06-13 DIAGNOSIS — I639 Cerebral infarction, unspecified: Secondary | ICD-10-CM

## 2019-06-13 DIAGNOSIS — M6281 Muscle weakness (generalized): Secondary | ICD-10-CM | POA: Diagnosis present

## 2019-06-13 DIAGNOSIS — Z87891 Personal history of nicotine dependence: Secondary | ICD-10-CM

## 2019-06-13 DIAGNOSIS — Z79899 Other long term (current) drug therapy: Secondary | ICD-10-CM | POA: Insufficient documentation

## 2019-06-13 NOTE — Assessment & Plan Note (Addendum)
Plan: Reestablish with Dr. Ernst Bowler Continue immunotherapy as outlined by allergy asthma Continue nasal medications as managed by Dr. Ernst Bowler Lab work today

## 2019-06-13 NOTE — Therapy (Signed)
[place], [place], [address] Phone: [phone]   Fax:  [phone]  Occupational Therapy Evaluation  Patient Details  Name: KYMARI Evans MRN: [id number] Date of Birth: [birth year] Referring Provider (OT): Dr. Irwin Brakeman   Encounter Date: 06/13/2019  OT End of Session - 06/13/19 2057    Visit Number  1    Number of Visits  12    Date for OT Re-Evaluation  07/25/19   mini reassess on 07/04/19   Authorization Type  Medicare - progress note due on 10th visit    Authorization - Visit Number  1    Authorization - Number of Visits  10    OT Start Time  1115    OT Stop Time  1200    OT Time Calculation (min)  45 min    Activity Tolerance  Patient tolerated treatment well    Behavior During Therapy  [facility] for tasks assessed/performed       Past Medical History:  Diagnosis Date  . AAA (abdominal aortic aneurysm) ([place])   . Adrenal nodule ([place])   . Asthma   . Bladder mass   . Bladder neoplasm   . Cancer [facility])    bladder cancer  . COPD (chronic obstructive pulmonary disease) ([place])   . Crohn's disease ([place])   . Diverticulitis   . GERD (gastroesophageal reflux disease)    takes Nexium off and on  . Over weight   . Skin lesion   . Thoracic aortic aneurysm ([place])   . Weak urinary stream     Past Surgical History:  Procedure Laterality Date  . APPENDECTOMY    . CHOLECYSTECTOMY    . LAPAROSCOPIC PARTIAL COLECTOMY  01/18/2018   ERAS PATHWAY  . LAPAROSCOPIC PARTIAL COLECTOMY N/A 01/18/2018   Procedure: LAPAROSCOPIC PARTIAL COLECTOMY ERAS PATHWAY;  Surgeon: Stark Klein, MD;  Location: [facility];  Service: General;  Laterality: N/A;  . TONSILLECTOMY      There were no vitals filed for this visit.  Subjective Assessment - 06/13/19 2126    Subjective   S:  I want to get back to riding my motorcycle.  I know its getting better already.    Pertinent History  Mr. 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  Patient was hospitalized fro 2-3 days and then dc home with OP PT and OT referred.  Patient presents this date with referral for OT to evaluate and treat.    Patient Stated Goals  I want to be able to use my hand to pick up things and ride my motorcycle again.    Currently in Pain?  No/denies        [facility] OT Assessment - 06/13/19 0001      Assessment   Medical Diagnosis  CVA with left upper extremity weakness and lack of coordination    Referring Provider (OT)  Dr. Irwin Brakeman    Onset Date/Surgical Date  05/29/19    Hand Dominance  Right    Prior Therapy  Acute Care PT, OP PT current       Precautions   Precautions  None      Restrictions   Weight Bearing Restrictions  No      Balance Screen   Has the patient  fallen in the past 6 months  No    Has the patient had a decrease in activity level because of a fear of falling?   No    Is the patient reluctant to leave their home because of a fear of falling?   No      Home  Environment   Family/patient expects to be discharged to:  Private residence    Living Arrangements  Spouse/significant other    Available Help at Discharge  Family      Prior Function   Level of Independence  Independent    Vocation  Retired    Biomedical scientist  former Engineer, building services    Leisure  enjoys yardwork, cooking, riding his motorcycle, and working on his spitfire and Astron[employer]      ADL   ADL comments  decreased coordination in his left hand causing decreased independence with B/IADLs and leisure tasks.       Vision - History   Baseline Vision  Wears glasses all the time      Cognition   Overall Cognitive Status  Within Functional Limits for tasks assessed      Sensation   Light Touch  Appears Intact       Coordination   Gross Motor Movements are Fluid and Coordinated  No    Fine Motor Movements are Fluid and Coordinated  No    Finger Nose Finger Test  able to touch nose and digit with accuracy, however movements are slower and with decreased coordination.      9 Hole Peg Test  Right;Left    Right 9 Hole Peg Test  23.9"    Left 9 Hole Peg Test  45"      Tone   Assessment Location  Left Upper Extremity      ROM / Strength   AROM / PROM / Strength  AROM;PROM;Strength      AROM   Overall AROM Comments  LUE A/ROM is WNL      PROM   Overall PROM Comments  LUE P/ROM is WNL      Strength   Overall Strength Comments  LUE shoulder strength 4/5, elbow 4+/5, wrist 5/5      Hand Function   Right Hand Grip (lbs)  68    Right Hand Lateral Pinch  20 lbs    Right Hand 3 Point Pinch  18 lbs    Left Hand Grip (lbs)  55    Left Hand Lateral Pinch  18 lbs    Left 3 point pinch  14 lbs      LUE Tone   LUE Tone  Brunnstrom Scale    Brunnstrom Scale (LUE)  No increase in muscle tone                      OT Education - 06/13/19 2055    Education Details  educated patient on HEP for bilateral integration and proximal shoulder strengthening, grip and pinch strengthening, and fine motor coordination.    Person(s) Educated  Patient    Methods  Explanation;Demonstration;Handout    Comprehension  Verbalized understanding;Returned demonstration       OT Short Term Goals - 06/13/19 2107      OT SHORT TERM GOAL #1   Title  Patient will be educated on a HEP for improved functional use of his left upper extremity with functional tasks.    Time  3    Period  Weeks    Status  New    Target Date  07/04/19      OT SHORT TERM GOAL #2   Title  Paitent will improve LUE strength to 4-/5 or better for improved ability to lift pots and pans when cooking.    Time  3    Period  Weeks    Status  New      OT SHORT TERM GOAL #3   Title  Patient will increase LUE GMC from fair + to good - as  evidence by increaesd accuracy with finger to nose test.    Time  3    Period  Weeks    Status  New      OT SHORT TERM GOAL #4   Title  Patient will increase left grip strength by 5# and pinch strength by 2# for improved ability to maintain grasp on household items.    Time  3    Period  Weeks    Status  New      OT SHORT TERM GOAL #5   Title  Patient will increase fine motor coordination as evidence by improve completion time on nine hole peg test by 10".    Time  3    Period  Weeks    Status  New        OT Long Term Goals - 06/13/19 2111      OT LONG TERM GOAL #1   Title  Patient will return to prior level of function with all B/IADLs and leisure activities using his left arm as dominant.    Time  6    Period  Weeks    Status  New    Target Date  07/25/19      OT LONG TERM GOAL #2   Title  Patient will improve GMC to WNL in his left arm for improved safety and accuracy when completing daily tasks and riding his motorcycle.    Time  6    Period  Weeks    Status  New      OT LONG TERM GOAL #3   Title  Patient will improve left upper extremity strength to 5/5 for increased ability to lift items overhead when working on his motorcycle and completing house hold tasks.    Time  6    Period  Weeks    Status  New      OT LONG TERM GOAL #4   Title  Patient will improve left hand grip strength by 10# and pinch strength by 3# or more in his dominant left hand in order to open containers and maintain grasp on house hold items.    Time  6    Period  Weeks    Status  New      OT LONG TERM GOAL #5   Title  Patient will improve left hand fine motor coordination to WNL as evidence by completing the nine hole peg test in 24" or less for improve ability to fasten buttons and manipulate change.    Time  6    Period  Weeks    Status  New            Plan - 06/13/19 2126    Clinical Impression Statement  A:  Patient is a 68 year old male with past medical history significant  for  AAA, COPD, bladder cancer and Crohn's disease.  Patient had a R CVA with left upper extremity weakness and decreased coordination.  Patient is unable to use his  left arm with functional tasks such as picking up items, maintaining his grip on household items, fastening buttons, and other fine motor tasks.  Patient will benefit from skilled OT intervention to improve GMC, [place], LUE strength, grip and pinch strength in order to return to prior level of independence with all B/IADLs and leisure tasks.    OT Occupational Profile and History  Detailed Assessment- Review of Records and additional review of physical, cognitive, psychosocial history related to current functional performance    Occupational performance deficits (Please refer to evaluation for details):  ADL's;IADL's;Leisure    Body Structure / Function / Physical Skills  ADL;Coordination;GMC;UE functional use;IADL;Dexterity;FMC;Proprioception;Strength    Rehab Potential  Excellent    Clinical Decision Making  Several treatment options, min-mod task modification necessary    Comorbidities Affecting Occupational Performance:  May have comorbidities impacting occupational performance    Modification or Assistance to Complete Evaluation   Min-Moderate modification of tasks or assist with assess necessary to complete eval    OT Frequency  2x / week    OT Duration  6 weeks    OT Treatment/Interventions  Self-care/ADL training;Therapeutic exercise;Manual Therapy;Neuromuscular education;Ultrasound;Electrical Stimulation;Moist Heat;Passive range of motion;Therapeutic activities;Patient/family education    Plan  P:  Skilled OT intervention to improve dominant left upper extremity strength, grip and pinch strength, GMC, and [place] in order to return to prior level of function with B/IADLs and leisure activiites.  Next session:  review HEP, treatment focusing on improving bilateral integration of gross motor tasks, fine motor coordination, grip and pinch  strength.    OT Home Exercise Plan  06/13/19:  gmc, fmc, girp and pinch strength.    Consulted and Agree with Plan of Care  Patient       Patient will benefit from skilled therapeutic intervention in order to improve the following deficits and impairments:   Body Structure / Function / Physical Skills: ADL, Coordination, GMC, UE functional use, IADL, Dexterity, FMC, Proprioception, Strength       Visit Diagnosis: Other symptoms and signs involving the nervous system - Plan: Ot plan of care cert/re-cert  Other lack of coordination - Plan: Ot plan of care cert/re-cert    Problem List Patient Active Problem List   Diagnosis Date Noted  . Medication management 06/13/2019  . CVA (cerebral vascular accident) ([place]) 05/29/2019  . Former smoker 05/29/2019  . Aneurysm of thoracic aorta ([place]) 11/22/2017  . Hyperlipidemia 11/22/2017  . AAA (abdominal aortic aneurysm) without rupture ([place]) 11/22/2017  . Seasonal and perennial allergic rhinitis 04/19/2017  . Severe persistent asthma, uncomplicated 78/29/5621  . GERD (gastroesophageal reflux disease) 04/08/2016  . Chronic nonseasonal allergic rhinitis due to fungal spores 01/17/2014  . Asthma-COPD overlap syndrome ([place]) 01/17/2014  . Mild chronic obstructive pulmonary disease ([place]) 01/17/2014    Vangie Bicker, [place], OTR/L [phone]  06/13/2019, 9:29 PM  [place] [address] [place], [place], [address] Phone: [phone]   Fax:  [phone]  Name: REAGAN KLEMZ MRN: [id number] Date of Birth: Jun 08, 1951

## 2019-06-13 NOTE — Addendum Note (Signed)
Addended by: Elton Sin on: 06/13/2019 05:32 PM   Modules accepted: Orders

## 2019-06-13 NOTE — Patient Instructions (Signed)
  Coordination Activities  Perform the following activities for 1 minutes 2-3 times per day with left hand(s).   Rotate ball in fingertips (clockwise and counter-clockwise).  Toss ball between hands.  Toss ball in air and catch with the same hand.  Flip cards 1 at a time as fast as you can.  Deal cards with your thumb (Hold deck in hand and push card off top with thumb).  Rotate card in hand (clockwise and counter-clockwise).  Shuffle cards.  Pick up coins, buttons, marbles, dried beans/pasta of different sizes and place in container.  Pick up coins and place in container or coin bank.  Pick up coins and stack.  Pick up coins one at a time until you get 5-10 in your hand, then move coins from palm to fingertips to stack one at a time.  Twirl pen between fingers.  Screw together nuts and bolts, then unfasten. Home Exercises Program Theraputty Exercises  Do the following exercises 2 times a day using your affected hand.  1. Roll putty into a ball.  2. Make into a pancake.  3. Roll putty into a roll.  4. Pinch along log with first finger and thumb.   5. Make into a ball.  6. Roll it back into a log.   7. Pinch using thumb and side of first finger.  8. Roll into a ball, then flatten into a pancake.  9. Using your fingers, make putty into a mountain. Occupational Therapy Exercises  Both arms straight out in front of you, keep your elbows straight do not let them bend, the criss/cross your arms in front of you. Both arms straight out in front of you, keep elbows straight.  Bend your wrist back and make small circles in the air.  Go in one direction the go in the other (Like you are waxing something) Both arms straight out in front of you, keep elbows straight.  Bend your wrists back and move your arm up while the left arm goes down as quick as you can. With your right/left arm straight out in front of you, keep your elbow straight.  Write your name in the air 3-4  times quickly. Put both of your hands on your lap with your palms down, turn your palms up and down as fast as you can. Put both of your hands on your lap keeping your wrists on your lap at all times.  No pat on your lap quickly alternating right/left. Put your hands on your lap and drum your fingers.  Make sure your fingers are moving individually. Play with a deck of cards. (Shuffle, deal cards, turn cards over pick up off table one by one) Do the above exercises at least 2 times a day

## 2019-06-13 NOTE — Assessment & Plan Note (Signed)
I believe patient's increased cough is likely multifactorial due to persistent chronic rhinitis symptoms as well as new ACE inhibitor the patient was placed on after hospitalization.  Plan: Follow-up with primary care regarding stopping her ACE inhibitor and switching to a different blood pressure medication such as losartan  Lab work today to see if patient is having persistent eosinophilia despite Nucala  May need to consider other biologic therapy such as Dupixent  Patient also needs to reestablish for an office visit with Dr. Gillermina Hu office

## 2019-06-13 NOTE — Assessment & Plan Note (Signed)
Likely asthma COPD overlap syndrome 150-pack-year smoking history Significantly elevated eosinophils on 2019 blood work Managed on [place] Also receiving allergy injections from Dr. Gillermina Hu office Persistent rhinorrhea Postnasal drip  Plan: Lab work today >>> We may need to consider different biologic therapies such as Dupixent which may be more appropriate for the patient as he has atopic features such as eczema as well as persistent chronic rhinitis Reestablish with Dr. Ernst Bowler with allergy asthma for an in person evaluation Continue Symbicort 160 We will work to get you established with Dr. Valeta Harms in our office in a 30-minute slot Walk today in office, patient tolerated without any oxygen desaturations

## 2019-06-13 NOTE — Assessment & Plan Note (Signed)
Plan: Continue lung cancer screening program, repeat CT in May/2021 Continue to not smoke

## 2019-06-13 NOTE — Therapy (Signed)
[place], [place], [address] Phone: [phone]   Fax:  [phone]  Physical Therapy Treatment  Patient Details  Name: Caleb Evans MRN: [id number] Date of Birth: Feb 03, 1951 Referring Provider (PT): Irwin Brakeman, MD   Encounter Date: 06/13/2019  PT End of Session - 06/13/19 1355    Visit Number  3    Number of Visits  18    Date for PT Re-Evaluation  07/25/19    Authorization Type  Medicare; Secondary: Generic commercial    Authorization Time Period  06/06/19 to 07/28/19    Authorization - Visit Number  3    Authorization - Number of Visits  10    PT Start Time  0109    PT Stop Time  1429    PT Time Calculation (min)  42 min    Activity Tolerance  Patient tolerated treatment well    Behavior During Therapy  [facility] for tasks assessed/performed       Past Medical History:  Diagnosis Date  . AAA (abdominal aortic aneurysm) ([place])   . Adrenal nodule ([place])   . Asthma   . Bladder mass   . Bladder neoplasm   . Cancer [facility])    bladder cancer  . COPD (chronic obstructive pulmonary disease) ([place])   . Crohn's disease ([place])   . Diverticulitis   . GERD (gastroesophageal reflux disease)    takes Nexium off and on  . Over weight   . Skin lesion   . Thoracic aortic aneurysm ([place])   . Weak urinary stream     Past Surgical History:  Procedure Laterality Date  . APPENDECTOMY    . CHOLECYSTECTOMY    . LAPAROSCOPIC PARTIAL COLECTOMY  01/18/2018   ERAS PATHWAY  . LAPAROSCOPIC PARTIAL COLECTOMY N/A 01/18/2018   Procedure: LAPAROSCOPIC PARTIAL COLECTOMY ERAS PATHWAY;  Surgeon: [facility], MD;  Location: [facility];  Service: General;  Laterality: N/A;  . TONSILLECTOMY      There were no vitals filed for this visit.  Subjective Assessment - 06/13/19 1353    Subjective  Pt reports not walking with SPC anymore. Pt reports able to ambulate around his property with only limitations being fatigue.    Patient is  accompained by:  Family member    Pertinent History  AAA    Limitations  Walking    How long can you sit comfortably?  no issues    How long can you stand comfortably?  30 minutes    How long can you walk comfortably?  20-30 minutes    Diagnostic tests  CT10/19/20 - no acute intracranial abnormality; age related atrophy and chronic small vessel ischemia, remote appearing lacunar infarct in R basal ganglia    Patient Stated Goals  to walk again    Currently in Pain?  No/denies            [facility] Adult PT Treatment/Exercise - 06/13/19 0001      Knee/Hip Exercises: Machines for Strengthening   Other Machine  bodycraft, legpress, 40#, x15 reps      Knee/Hip Exercises: Standing   Forward Step Up  Left;10 reps;Step Height: 8"    Forward Step Up Limitations  --    Step Down  Left    Step Down Limitations  heel taps, attempted but too weak to perform    Functional Squat Limitations  chair taps, x5 reps    Wall Squat  5 seconds    Wall Squat Limitations  3 reps          Balance Exercises - 06/13/19 1420      Balance Exercises: Standing   Tandem Stance  Eyes open;4 reps;30 secs    SLS  Eyes open;Solid surface;5 reps;10 secs    Tandem Gait  Forward;2 reps   blue line, x2RT   Cone Rotation Limitations  flat foot cone taps, 6" cones at midline, x10 reps each foot    Marching Limitations  gait on foam balance beam, x3RT, stepping off due to balance challenge 20% of the time    Heel Raises Limitations  forward step ups with opposite knee drive for balance, 4" step, x10 reps each leg        PT Education - 06/13/19 1354    Education Details  Exercise technique, updated HEP    Person(s) Educated  Patient    Methods  Explanation;Handout    Comprehension  Verbalized understanding;Returned demonstration       PT Short Term Goals - 06/13/19 1356      PT SHORT TERM GOAL #1   Title  Pt will consistently perform HEP at least 3x/week to improve strength, gait, and ability to perform  recreational activities.    Time  3    Period  Weeks    Status  On-going    Target Date  06/27/19      PT SHORT TERM GOAL #2   Title  Pt will improve 5x STS test to 15 sec, no UE assist, no AD, and equal weightbearing throughout BLE to demo improved functional strength when rising from seated surfaces.    Time  3    Period  Weeks    Status  On-going      PT SHORT TERM GOAL #3   Title  Pt will ambulate 0.2ms with LRAD to reduce risk for falls and improve ability to navigate limited community distances.    Time  3    Period  Weeks    Status  On-going        PT Long Term Goals - 06/13/19 1356      PT LONG TERM GOAL #1   Title  Pt will demonstrate LLE 4/5 strength to reduce risk for falls, improve gait, improve transfers, and allow pt to return to normal recreational activities.    Time  7    Period  Weeks    Status  On-going      PT LONG TERM GOAL #2   Title  Pt will ambulate 1.261m with LRAD to reduce risk for falls and improve ability to navigate throughout the community.    Time  7    Period  Weeks    Status  On-going      PT LONG TERM GOAL #3   Title  Pt will improve FGA to 19 to decrease risk for falls with yard work, working on cars, and vaEnvironmental health practitioner   Time  7    Period  Weeks    Status  On-going      PT LONG TERM GOAL #4   Title  Pt will perform SLS equally, bilaterally to improve gait pattern, stair negotiation, and reduce risk for falls with activity.    Time  7    Period  Weeks    Status  On-going            Plan - 06/13/19 1355    Clinical Impression Statement  Pt with increased difficulty with static, SLS on solid surface. Educated  pt on dynamic stepping strategies and demonstrate appropriate technique prior to dynamic balance training with good carryover during exercises. Pt performs tandem gait on blue line, required to step off 3 times due to balance deficits, but able to maintain upright balance. Pt performs flat foot cone taps with loss of  balance 2x with L foot tap, demonstrates good ankle strategy stepping to prevent loss of balance and doesn't require physical assistance from therapist. Pt able to perform legpress machine, verbal cues for decreased speed to improve motor control and to avoid L knee hyperextension when pressing out, both improving with cues. Pt attempted heal taps from 4" step, but due to LLE weakness, pt unable to perform and instead stepped down to floor with full weight. Pt able to perform chair taps with verbal cues to avoid genu valgum. Pt tolerates wall sits, but fatigues with isometric hold, only able to maintain for 5 seconds. Continue to progress as able.    Personal Factors and Comorbidities  Comorbidity 2    Comorbidities  AAA, COPD, asthma    Examination-Activity Limitations  Bed Mobility;Locomotion Level;Stairs;Transfers    Examination-Participation Restrictions  Driving;Yard Work    Stability/Clinical Decision Making  Stable/Uncomplicated    Rehab Potential  Good    PT Frequency  3x / week    PT Duration  Other (comment)   7 weeks due to 1 week vacation   PT Treatment/Interventions  ADLs/Self Care Home Management;Aquatic Therapy;Biofeedback;Electrical Stimulation;DME Instruction;Gait training;Stair training;Functional mobility training;Therapeutic activities;Therapeutic exercise;Balance training;Neuromuscular re-education;Patient/family education;Orthotic Fit/Training;Manual techniques;Passive range of motion;Dry needling;Taping;Joint Manipulations    PT Next Visit Plan  Strengthening exercises for LLE. Static balance exercises, progressing to dynamic when able. Gait training, progress to no AD.    PT Home Exercise Plan  Eval: sidelying hip abduction, bridges, LAQ with 5 sec hold; 11/3: tandem stance, chair taps    Consulted and Agree with Plan of Care  Patient       Patient will benefit from skilled therapeutic intervention in order to improve the following deficits and impairments:  Abnormal gait,  Decreased activity tolerance, Decreased balance, Decreased coordination, Decreased endurance, Decreased mobility, Decreased strength, Difficulty walking, Impaired perceived functional ability  Visit Diagnosis: Other symptoms and signs involving the nervous system  Unsteadiness on feet  Muscle weakness (generalized)  Difficulty in walking, not elsewhere classified     Problem List Patient Active Problem List   Diagnosis Date Noted  . CVA (cerebral vascular accident) ([place]) 05/29/2019  . Former smoker - Quit 2020 05/29/2019  . Aneurysm of thoracic aorta ([place]) 11/22/2017  . Hyperlipidemia 11/22/2017  . AAA (abdominal aortic aneurysm) without rupture ([place]) 11/22/2017  . Seasonal and perennial allergic rhinitis 04/19/2017  . Severe persistent asthma, uncomplicated 83/66/2947  . GERD (gastroesophageal reflux disease) 04/08/2016  . Chronic nonseasonal allergic rhinitis due to fungal spores 01/17/2014  . COPD, mild ([place]) 01/17/2014  . Mild chronic obstructive pulmonary disease ([place]) 01/17/2014    Caleb Evans PT, DPT 06/13/19, 2:33 PM [place] [address] [place], [place], [address] Phone: [phone]   Fax:  [phone]  Name: Caleb Evans MRN: [id number] Date of Birth: 04-12-1951

## 2019-06-13 NOTE — Patient Instructions (Addendum)
You were seen today by Lauraine Rinne, NP  for:   1. Asthma-COPD overlap syndrome ([place])  Labwork today   Walk today in office  Continue Nucala injections  Schedule follow-up with allergy asthma Dr. Ernst Bowler  Continue immunotherapy with allergy asthma  Continue Symbicort 160 >>> 2 puffs in the morning right when you wake up, rinse out your mouth after use, 12 hours later 2 puffs, rinse after use >>> Take this daily, no matter what >>> This is not a rescue inhaler   Only use your albuterol as a rescue medication to be used if you can't catch your breath by resting or doing a relaxed purse lip breathing pattern.  - The less you use it, the better it will work when you need it. - Ok to use up to 2 puffs  every 4 hours if you must but call for immediate appointment if use goes up over your usual need - Don't leave home without it !!  (think of it like the spare tire for your car)   We will work to get you established with Dr. Valeta Harms in our office per your request   2. Seasonal and perennial allergic rhinitis  Continue medications as outlined by Dr. Ernst Bowler with allergy asthma  Continue immunotherapy as outlined by Dr. Ernst Bowler  Please schedule a follow-up visit with Dr. Ernst Bowler regarding your medication questions as well as persistent postnasal drip  3. Former smoker - Quit [employer] job on stopping smoking  4. Cerebrovascular accident (CVA), unspecified mechanism ([place])  Continue to follow-up with primary care  5. Medication Management   Please contact primary care regarding your cough as well as your recent lisinopril prescription We would recommend that primary care stop lisinopril and start you on a ARB such as losartan    Follow Up:    Return in about 4 weeks (around 07/11/2019), or if symptoms worsen or fail to improve, for Follow up with Dr. Valeta Harms - 80mn former PM pt.   Please do your part to reduce the spread of COVID-19:      Reduce your risk of any  infection  and COVID19 by using the similar precautions used for avoiding the common cold or flu:  .Marland KitchenWash your hands often with soap and warm water for at least 20 seconds.  If soap and water are not readily available, use an alcohol-based hand sanitizer with at least 60% alcohol.  . If coughing or sneezing, cover your mouth and nose by coughing or sneezing into the elbow areas of your shirt or coat, into a tissue or into your sleeve (not your hands). .Langley GaussA MASK when in public  . Avoid shaking hands with others and consider head nods or verbal greetings only. . Avoid touching your eyes, nose, or mouth with unwashed hands.  . Avoid close contact with people who are sick. . Avoid places or events with large numbers of people in one location, like concerts or sporting events. . If you have some symptoms but not all symptoms, continue to monitor at home and seek medical attention if your symptoms worsen. . If you are having a medical emergency, call 911.   [place]/ e-Visit: [email or web address]        [facility]: [facility]: [phone]                  [facility]:  [phone]     It is flu season:   >>> Best ways to protect herself from the flu: Receive the yearly flu vaccine, practice good hand hygiene washing with soap and also using hand sanitizer when available, eat a nutritious meals, get adequate rest, hydrate appropriately   Please contact the office if your symptoms worsen or you have concerns that you are not improving.   Thank you for choosing [facility] for your healthcare, and for allowing [place] to partner with you on your healthcare journey. I am thankful to be able to provide care to you today.   Wyn Quaker FNP-C

## 2019-06-13 NOTE — Assessment & Plan Note (Signed)
Plan: Continue to follow-up with primary care Follow-up with primary care regarding your recent lisinopril prescription as I believe this likely should be switched to an ARB

## 2019-06-14 ENCOUNTER — Other Ambulatory Visit: Payer: Self-pay

## 2019-06-14 ENCOUNTER — Ambulatory Visit (HOSPITAL_COMMUNITY): Payer: Medicare Other

## 2019-06-14 ENCOUNTER — Encounter (HOSPITAL_COMMUNITY): Payer: Self-pay

## 2019-06-14 DIAGNOSIS — R2681 Unsteadiness on feet: Secondary | ICD-10-CM

## 2019-06-14 DIAGNOSIS — R262 Difficulty in walking, not elsewhere classified: Secondary | ICD-10-CM

## 2019-06-14 DIAGNOSIS — M6281 Muscle weakness (generalized): Secondary | ICD-10-CM

## 2019-06-14 DIAGNOSIS — R29818 Other symptoms and signs involving the nervous system: Secondary | ICD-10-CM | POA: Diagnosis not present

## 2019-06-14 LAB — CBC WITH DIFFERENTIAL/PLATELET
Basophils Absolute: 0.1 10*3/uL (ref 0.0–0.1)
Basophils Relative: 1 % (ref 0.0–3.0)
Eosinophils Absolute: 0.1 10*3/uL (ref 0.0–0.7)
Eosinophils Relative: 0.9 % (ref 0.0–5.0)
HCT: 44 % (ref 39.0–52.0)
Hemoglobin: 14.6 g/dL (ref 13.0–17.0)
Lymphocytes Relative: 29.9 % (ref 12.0–46.0)
Lymphs Abs: 2.9 10*3/uL (ref 0.7–4.0)
MCHC: 33.2 g/dL (ref 30.0–36.0)
MCV: 93.2 fl (ref 78.0–100.0)
Monocytes Absolute: 0.6 10*3/uL (ref 0.1–1.0)
Monocytes Relative: 5.9 % (ref 3.0–12.0)
Neutro Abs: 5.9 10*3/uL (ref 1.4–7.7)
Neutrophils Relative %: 62.3 % (ref 43.0–77.0)
Platelets: 388 10*3/uL (ref 150.0–400.0)
RBC: 4.72 Mil/uL (ref 4.22–5.81)
RDW: 14.4 % (ref 11.5–15.5)
WBC: 9.5 10*3/uL (ref 4.0–10.5)

## 2019-06-14 IMAGING — CT CT CHEST LUNG CANCER SCREENING LOW DOSE
2 of 5 series · 14 of 40 positions shown, 17 images · non-contrast
Comparison: Low-dose lung cancer screening chest CT 10/14/2017.

CLINICAL DATA: 67-year-old male former smoker (quit less than 1
year ago) with 153 pack-year history of smoking. Lung cancer
screening examination.

EXAM:
CT CHEST WITHOUT CONTRAST LOW-DOSE FOR LUNG CANCER SCREENING
TECHNIQUE: Multidetector CT imaging of the chest was performed following the
standard protocol without IV contrast.

[Series 3: lung · axial · 0.68mm/px · z∈[-1249,-948]mm · 11 of 337 slices shown, 14 images (1 of 2)]
[im 18/337  mediastinal]
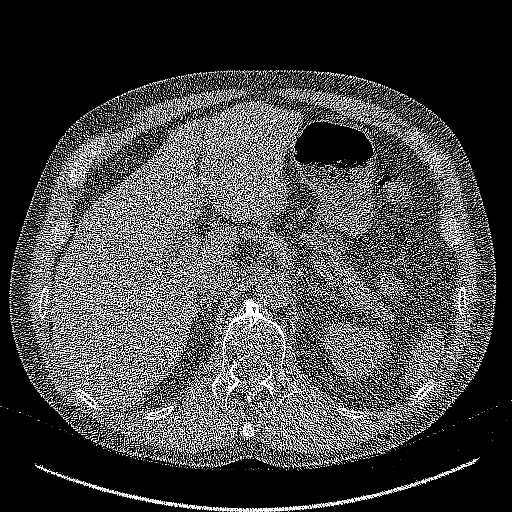
[im 18/337  lung]
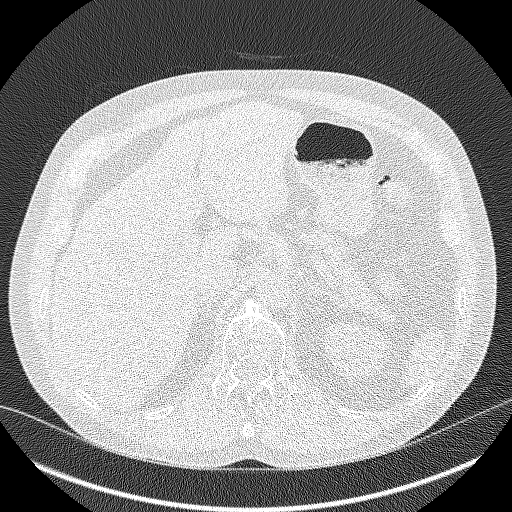
[im 54/337  lung]
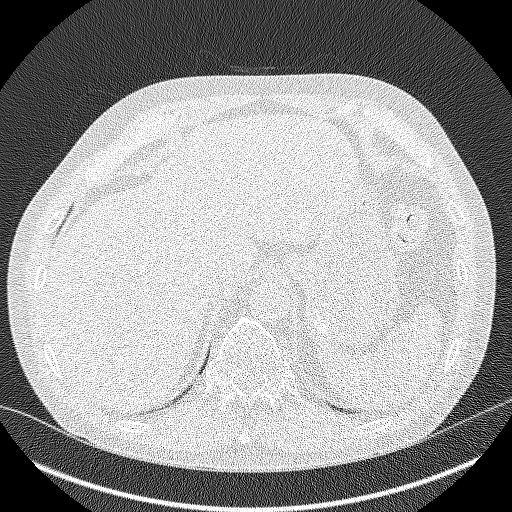
[im 89/337  lung]
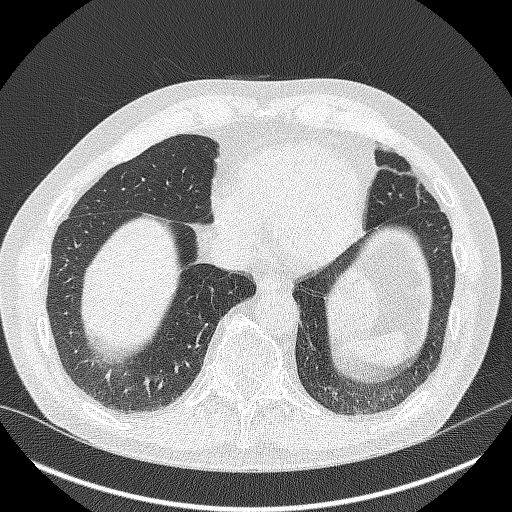
[im 107/337  lung]
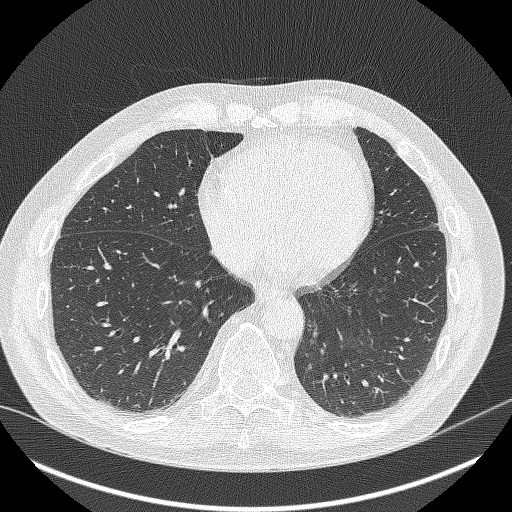
[im 142/337  mediastinal]
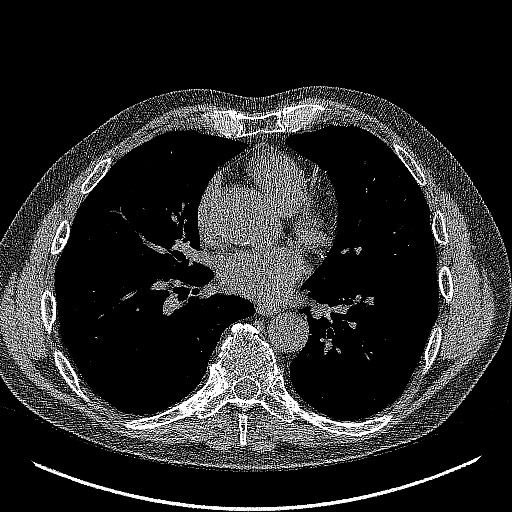
[im 142/337  lung]
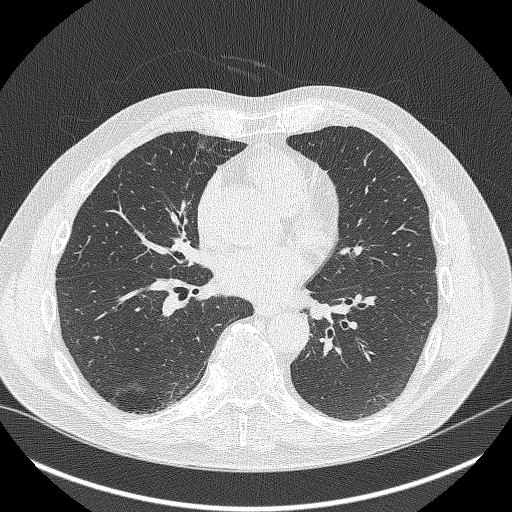
[im 177/337  lung]
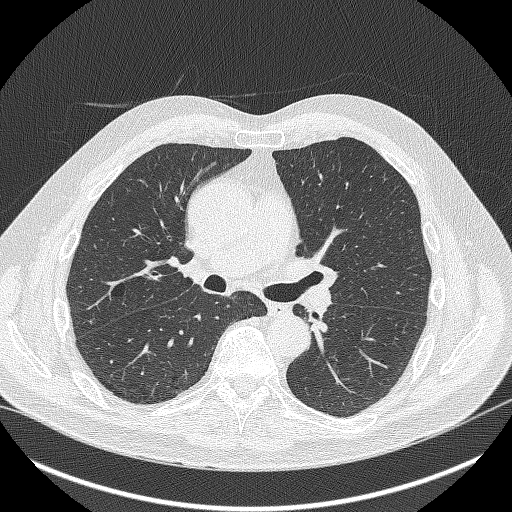
[im 195/337  lung]
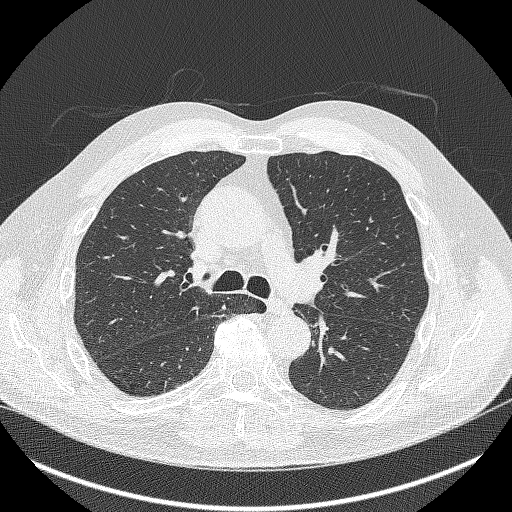
[im 230/337  lung]
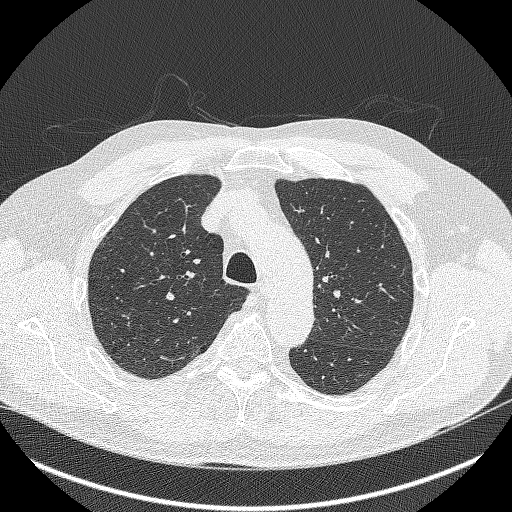
[im 248/337  mediastinal]
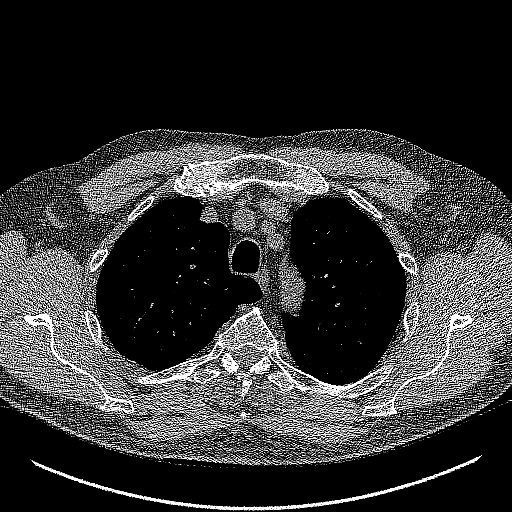
[im 248/337  lung]
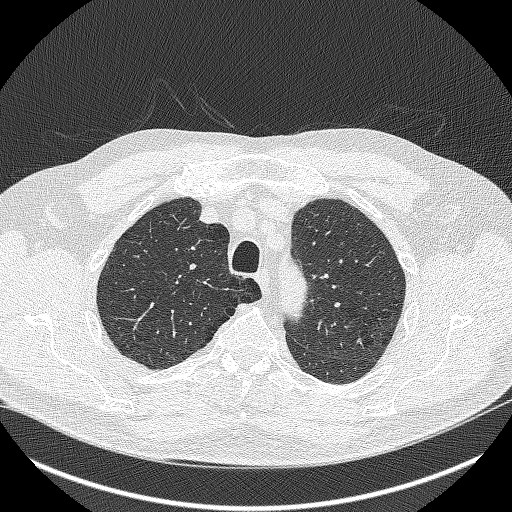
[im 283/337  lung]
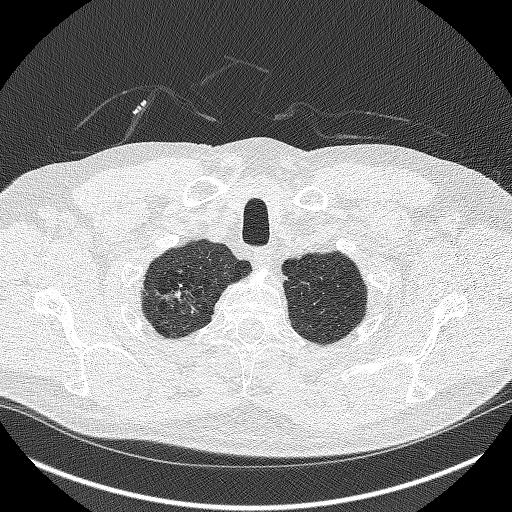
[im 319/337  lung]
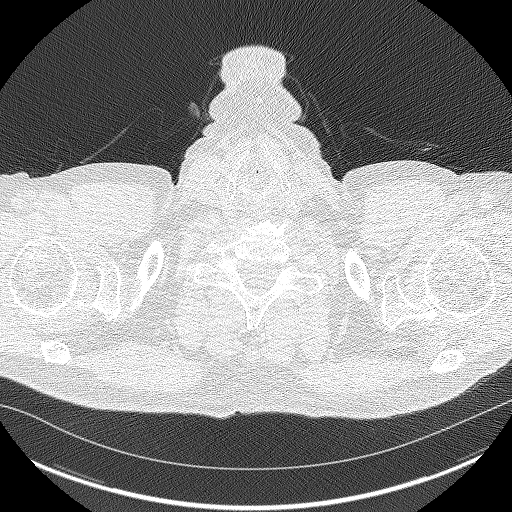

[Series 4: lung · coronal · 0.66mm/px · 3 of 322 slices shown (2 of 2)]
[im 65/322  lung]
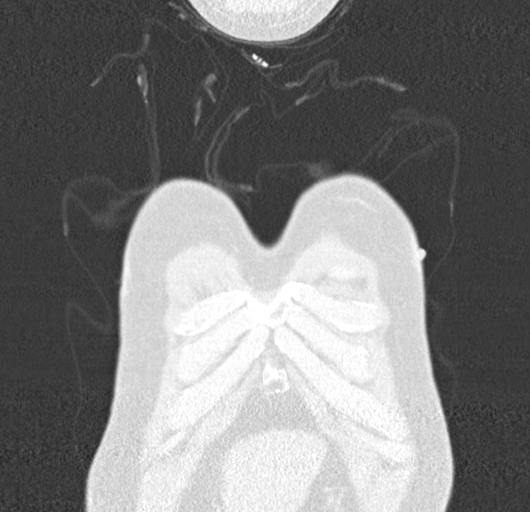
[im 129/322  lung]
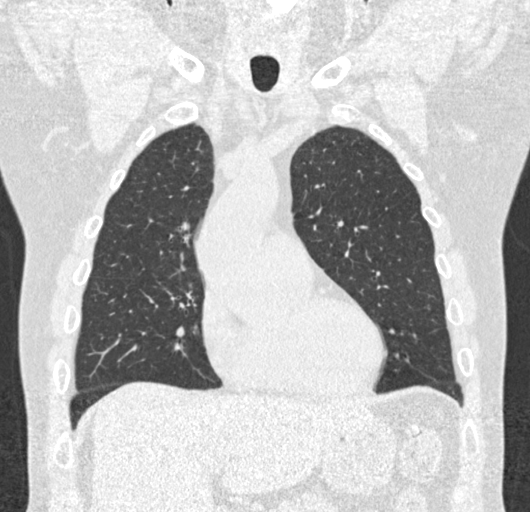
[im 193/322  lung]
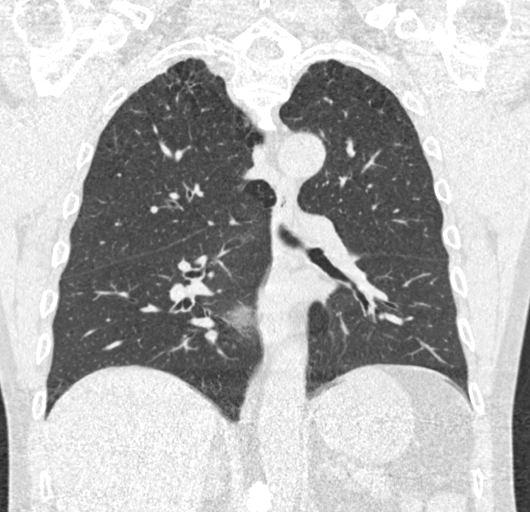

[14 of 40 positions shown; findings below may reference images not displayed]

FINDINGS: Cardiovascular: Heart size is normal. There is no significant
pericardial fluid, thickening or pericardial calcification. There is
aortic atherosclerosis, as well as atherosclerosis of the great
vessels of the mediastinum and the coronary arteries, including
calcified atherosclerotic plaque in the left main, left anterior
descending, left circumflex and right coronary arteries. Mild
aneurysmal dilatation of the ascending thoracic aorta (4.5 cm in
diameter).

Mediastinum/Nodes: No pathologically enlarged mediastinal or hilar
lymph nodes. Please note that accurate exclusion of hilar adenopathy
is limited on noncontrast CT scans. Esophagus is unremarkable in
appearance. No axillary lymphadenopathy.

Lungs/Pleura: Tiny subpleural pulmonary nodule in the periphery of
the right upper lobe (axial image 65 of series 3), with a volume
derived mean diameter of 2.4 mm. No larger more suspicious appearing
pulmonary nodules or masses are noted. No acute consolidative
airspace disease. No pleural effusions. Mild diffuse bronchial wall
thickening with mild centrilobular and paraseptal emphysema.
Bilateral apically nodular pleuroparenchymal thickening and
architectural distortion (right greater than left), similar to the
prior study, most compatible with mild post infectious or
inflammatory scarring.

Upper Abdomen: Aortic atherosclerosis.  Status post cholecystectomy.

Musculoskeletal: There are no aggressive appearing lytic or blastic
lesions noted in the visualized portions of the skeleton.
IMPRESSION: 1. Lung-RADS 2S, benign appearance or behavior. Continue annual
screening with low-dose chest CT without contrast in 12 months.
2. The "S" modifier above refers to potentially clinically
significant non lung cancer related findings. Specifically, there is
aortic atherosclerosis, in addition to left main and 3 vessel
coronary artery disease. Please note that although the presence of
coronary artery calcium documents the presence of coronary artery
disease, the severity of this disease and any potential stenosis
cannot be assessed on this non-gated CT examination. Assessment for
potential risk factor modification, dietary therapy or pharmacologic
therapy may be warranted, if clinically indicated.
3. Mild diffuse bronchial wall thickening with mild centrilobular
and paraseptal emphysema; imaging findings suggestive of underlying
COPD.
4. Mild aneurysmal dilatation of the ascending thoracic aorta (4.5
cm in diameter). Ascending thoracic aortic aneurysm. Recommend
semi-annual imaging followup by CTA or MRA and referral to
cardiothoracic surgery if not already obtained. This recommendation
follows 9090 ACCF/AHA/AATS/ACR/ASA/SCA/TUMIRI/TARI/KATHISITA/PARKOO Guidelines
for the Diagnosis and Management of Patients With Thoracic Aortic
Disease. Circulation. 9090; 121: E266-e369. Aortic aneurysm NOS
(0KE7F-V11.L).

Aortic Atherosclerosis (0KE7F-079.9) and Emphysema (0KE7F-N0I.L).

## 2019-06-14 NOTE — Telephone Encounter (Signed)
PCCM: Yes ok by me.  Garner Nash, DO [facility] 06/14/2019 8:02 AM

## 2019-06-14 NOTE — Therapy (Signed)
[place], [place], [address] Phone: [phone]   Fax:  [phone]  Physical Therapy Treatment  Patient Details  Name: Caleb Evans MRN: [id number] Date of Birth: [birth year] Referring Provider (PT): Irwin Brakeman, MD   Encounter Date: 06/14/2019  PT End of Session - 06/14/19 1709    Visit Number  4    Number of Visits  18    Date for PT Re-Evaluation  07/25/19    Authorization Type  Medicare; Secondary: Generic commercial    Authorization Time Period  06/06/19 to 07/28/19    Authorization - Visit Number  4    Authorization - Number of Visits  10    PT Start Time  9924    PT Stop Time  1658    PT Time Calculation (min)  41 min    Equipment Utilized During Treatment  Gait belt    Activity Tolerance  Patient tolerated treatment well    Behavior During Therapy  [facility] for tasks assessed/performed       Past Medical History:  Diagnosis Date  . AAA (abdominal aortic aneurysm) ([place])   . Adrenal nodule ([place])   . Asthma   . Bladder mass   . Bladder neoplasm   . Cancer [facility])    bladder cancer  . COPD (chronic obstructive pulmonary disease) ([place])   . Crohn's disease ([place])   . Diverticulitis   . GERD (gastroesophageal reflux disease)    takes Nexium off and on  . Over weight   . Skin lesion   . Thoracic aortic aneurysm ([place])   . Weak urinary stream     Past Surgical History:  Procedure Laterality Date  . APPENDECTOMY    . CHOLECYSTECTOMY    . LAPAROSCOPIC PARTIAL COLECTOMY  01/18/2018   ERAS PATHWAY  . LAPAROSCOPIC PARTIAL COLECTOMY N/A 01/18/2018   Procedure: LAPAROSCOPIC PARTIAL COLECTOMY ERAS PATHWAY;  Surgeon: Stark Klein, MD;  Location: [place];  Service: General;  Laterality: N/A;  . TONSILLECTOMY      There were no vitals filed for this visit.  Subjective Assessment - 06/14/19 1621    Subjective  Pt arrived without SPC.  Reports change in BP medication due to making cough, taken  vitals WNL.  Stated his Lt UE feels weak, has been playing lots of games and using it.    Pertinent History  AAA    Patient Stated Goals  to walk again    Currently in Pain?  No/denies                       [facility] Adult PT Treatment/Exercise - 06/14/19 0001      Knee/Hip Exercises: Machines for Strengthening   Other Machine  bodycraft, legpress, 40#, x15 reps      Knee/Hip Exercises: Standing   Forward Step Up  Both;10 reps;Hand Hold: 0;Step Height: 6"    Forward Step Up Limitations  opposite side knee drive x 5" holds    Functional Squat Limitations  chair taps, x5 reps          Balance Exercises - 06/14/19 1636      Balance Exercises: Standing   SLS  Eyes open;Solid surface;3 reps   Lt 12", Rt 25" max of 3   Balance Beam  tandem 2RT    Tandem Gait  Forward;Retro;2 reps    Sidestepping  2 reps;Theraband   Resume next session   Step Over Hurdles / Cones  Toe  tapping top of 3 cones, then multiple cones prior contact to floor; 6 and 12 hurdles 3RT    Cone Rotation Limitations  flat foot cone taps, 6" cones at midline, x10 reps each foot          PT Short Term Goals - 06/13/19 1356      PT SHORT TERM GOAL #1   Title  Pt will consistently perform HEP at least 3x/week to improve strength, gait, and ability to perform recreational activities.    Time  3    Period  Weeks    Status  On-going    Target Date  06/27/19      PT SHORT TERM GOAL #2   Title  Pt will improve 5x STS test to 15 sec, no UE assist, no AD, and equal weightbearing throughout BLE to demo improved functional strength when rising from seated surfaces.    Time  3    Period  Weeks    Status  On-going      PT SHORT TERM GOAL #3   Title  Pt will ambulate 0.72ms with LRAD to reduce risk for falls and improve ability to navigate limited community distances.    Time  3    Period  Weeks    Status  On-going        PT Long Term Goals - 06/13/19 1356      PT LONG TERM GOAL #1   Title  Pt  will demonstrate LLE 4/5 strength to reduce risk for falls, improve gait, improve transfers, and allow pt to return to normal recreational activities.    Time  7    Period  Weeks    Status  On-going      PT LONG TERM GOAL #2   Title  Pt will ambulate 1.255m with LRAD to reduce risk for falls and improve ability to navigate throughout the community.    Time  7    Period  Weeks    Status  On-going      PT LONG TERM GOAL #3   Title  Pt will improve FGA to 19 to decrease risk for falls with yard work, working on cars, and vaEnvironmental health practitioner   Time  7    Period  Weeks    Status  On-going      PT LONG TERM GOAL #4   Title  Pt will perform SLS equally, bilaterally to improve gait pattern, stair negotiation, and reduce risk for falls with activity.    Time  7    Period  Weeks    Status  On-going            Plan - 06/14/19 1710    Clinical Impression Statement  Pt continues to have difficulty wiht static, SLS on solid surface.  Session focus on balalnce training with increased focus on SLS form.  Added 6 and 12in hurdles with 5" holds and continues with step up knee drives for functional strengthening in SLS form.  Completed dual tasking with cone rotation during gait to improve balance and coordination, no LOB episodes noted, just slower pace with UE movements.  Pt limited by fatigue required a couple seated rest breaks.    Personal Factors and Comorbidities  Comorbidity 2    Comorbidities  AAA, COPD, asthma    Examination-Activity Limitations  Bed Mobility;Locomotion Level;Stairs;Transfers    Examination-Participation Restrictions  Driving;Yard Work    Stability/Clinical Decision Making  Stable/Uncomplicated    ClDesigner, jewelleryLow  Rehab Potential  Good    PT Frequency  3x / week    PT Duration  --   7 weeks with 1 week vacation   PT Treatment/Interventions  ADLs/Self Care Home Management;Aquatic Therapy;Biofeedback;Electrical Stimulation;DME Instruction;Gait training;Stair  training;Functional mobility training;Therapeutic activities;Therapeutic exercise;Balance training;Neuromuscular re-education;Patient/family education;Orthotic Fit/Training;Manual techniques;Passive range of motion;Dry needling;Taping;Joint Manipulations    PT Next Visit Plan  Strengthening exercises for LLE. Static balance exercises, progressing to dynamic when able.    PT Home Exercise Plan  Eval: sidelying hip abduction, bridges, LAQ with 5 sec hold; 11/3: tandem stance, chair taps       Patient will benefit from skilled therapeutic intervention in order to improve the following deficits and impairments:  Abnormal gait, Decreased activity tolerance, Decreased balance, Decreased coordination, Decreased endurance, Decreased mobility, Decreased strength, Difficulty walking, Impaired perceived functional ability  Visit Diagnosis: Muscle weakness (generalized)  Difficulty in walking, not elsewhere classified  Unsteadiness on feet  Other symptoms and signs involving the nervous system     Problem List Patient Active Problem List   Diagnosis Date Noted  . Medication management 06/13/2019  . CVA (cerebral vascular accident) ([place]) 05/29/2019  . Former smoker 05/29/2019  . Aneurysm of thoracic aorta ([place]) 11/22/2017  . Hyperlipidemia 11/22/2017  . AAA (abdominal aortic aneurysm) without rupture ([place]) 11/22/2017  . Seasonal and perennial allergic rhinitis 04/19/2017  . Severe persistent asthma, uncomplicated 60/47/9987  . GERD (gastroesophageal reflux disease) 04/08/2016  . Chronic nonseasonal allergic rhinitis due to fungal spores 01/17/2014  . Asthma-COPD overlap syndrome ([place]) 01/17/2014  . Mild chronic obstructive pulmonary disease ([place]) 01/17/2014   Ihor Austin, LPTA; CBIS [phone]  Aldona Lento 06/14/2019, 5:20 PM  [facility] [address] [place], [place], [address] Phone: [phone]   Fax:  [phone]  Name:  Caleb Evans MRN: [id number] Date of Birth: June 16, 1951

## 2019-06-14 NOTE — Telephone Encounter (Signed)
Patient has appointment with Dr. Valeta Harms for 06/29/2019. Nothing further is needed.

## 2019-06-14 NOTE — Progress Notes (Signed)
NEUROLOGY CONSULTATION NOTE  SOSAIA PITTINGER MRN: [id number] DOB: 15-Jul-1951  Referring provider: Irwin Brakeman, MD (hospital referral) Primary care provider: Clemon Chambers, MD  Reason for consult:  CVA  HISTORY OF PRESENT ILLNESS: Caleb Evans is a 68 year old right-handed Caucasian male with COPD, Crohn's disease, AAA, and history of bladder cancer who presents for CVA.  History supplemented by hospital records.  CT/CTA performed in hospital personally reviewed.  He was admitted to [facility] on 05/29/2019 for acute onset of left sided weakness and unsteady gait.  The previous day, he noted feeling unsteady on his feet.  The next morning, he noted left upper extremity and left lower extremity weakness.  Initially, he noted some numbness as well.  CT head showed chronic small vessel ischemic changes and remote right basal ganglia lacunar infarct but no acute intracranial abnormality.  He did not receive tPA as he was outside the therapeutic window.  CT of brain showed mild chronic small vessel disease and remote small lacunar infarcts involving the right putamen and left centrum semiovale but no acute intracranial abnormalities.  He was unable to get an MRI due to metal in his eye.  CTA of head and neck showed no intracranial or extracranial emergent large vessel stenosis or occlusion.  Carotid doppler showed no hemodynamically significant stenosis.  2D echocardiogram showed EF 55-60% with no cardiac source of embolus.  LDL was 50 with TG 259.  Hgb A1c was 6.1.  HIV and coronovirus testing were negative.  EEG was normal.  He was continued on Lipitor 85m daily and started on ASA 847mdaily.    He still has weakness in the left arm.  Left leg is improving.  PAST MEDICAL HISTORY: Past Medical History:  Diagnosis Date  . AAA (abdominal aortic aneurysm) ([place]  . Adrenal nodule ([place]  . Asthma   . Bladder mass   . Bladder neoplasm   . [facility]   bl[facility]  cancer  . COPD (ch[place] obstructive pulmonary disease) ([place]  . Crohn's disease ([facility]  . Diverticulitis   . GERD (gastroesophageal reflux disease)    takes Nexium off and on  . Over weight   . Skin lesion   . Thoracic aortic aneurysm ([place]  . Weak urinary stream     PAST SURGICAL HISTORY: Past Surgical History:  Procedure Laterality Date  . APPENDECTOMY    . CHOLECYSTECTOMY    . LAPAROSCOPIC PARTIAL COLECTOMY  01/18/2018   ERAS PATHWAY  . LAPAROSCOPIC PARTIAL COLECTOMY N/A 01/18/2018   Procedure: LAPAROSCOPIC PARTIAL COLECTOMY ERAS PATHWAY;  Surgeon: ByStark KleinMD;  [facility] Service: General;  Laterality: N/A;  . TONSILLECTOMY      MEDICATIONS: Current Outpatient Medications on File Prior to Visit  Medication Sig Dispense Refill  . aspirin 81 MG chewable tablet Chew 1 tablet (81 mg total) by mouth daily.    . Marland Kitchentorvastatin (LIPITOR) 80 MG tablet Take 80 mg by mouth at bedtime.    . budesonide-formoterol (SYMBICORT) 160-4.5 MCG/ACT inhaler Inhale 2 puffs into the lungs 2 (two) times daily.    . Calcium Carb-Cholecalciferol (CALCIUM 600+D3 PO) Take 1 tablet by mouth daily.    . Fluticasone Propionate (XHANCE) 93 MCG/ACT EXHU Place 2 puffs into the nose 2 (two) times daily. 32 mL 5  . ipratropium-albuterol (DUONEB) 0.5-2.5 (3) MG/3ML SOLN Take 3 mLs by nebulization every 4 (four) hours as needed.    . Marland Kitchenisinopril (ZESTRIL) 20 MG  tablet Take 1 tablet (20 mg total) by mouth daily. 30 tablet 1  . Mepolizumab (NUCALA) 100 MG/ML SOAJ Inject into the skin.    . Multiple Vitamin (MULTIVITAMIN WITH MINERALS) TABS tablet Take 1 tablet by mouth daily.    Marland Kitchen PROAIR HFA 108 (90 Base) MCG/ACT inhaler INHALE 2 PUFFS EVERY 4 TO 6 HOURS AS NEEDED. 8.5 g 1   Current Facility-Administered Medications on File Prior to Visit  Medication Dose Route Frequency Provider Last Rate Last Dose  . clindamycin (CLEOCIN) 900 mg in dextrose 5 % 50 mL IVPB  900 mg Intravenous 60 min Pre-Op Stark Klein, MD       And  . gentamicin (GARAMYCIN) 390 mg in dextrose 5 % 50 mL IVPB  5 mg/kg Intravenous 60 min Pre-Op Stark Klein, MD      . Mepolizumab SOLR 100 mg  100 mg Subcutaneous Q28 days Mannam, Praveen, MD   100 mg at 06/21/18 0809  . Mepolizumab SOLR 100 mg  100 mg Subcutaneous Q28 days Mannam, Praveen, MD   100 mg at 09/23/18 1621  . Mepolizumab SOLR 100 mg  100 mg Subcutaneous Q28 days Mannam, Praveen, MD   100 mg at 12/26/18 1411  . Mepolizumab SOLR 100 mg  100 mg Subcutaneous Q28 days Mannam, Praveen, MD   100 mg at 01/23/19 1405  . Mepolizumab SOLR 100 mg  100 mg Subcutaneous Q28 days Mannam, Praveen, MD   100 mg at 02/23/19 1347  . Mepolizumab SOLR 100 mg  100 mg Subcutaneous Q28 days Mannam, Praveen, MD   100 mg at 03/27/19 1359  . Mepolizumab SOLR 100 mg  100 mg Subcutaneous Q28 days Mannam, Praveen, MD   100 mg at 04/27/19 1413    ALLERGIES: Allergies  Allergen Reactions  . Chlorpheniramine Other (See Comments)    Sinus congestion  . Amoxicillin-Pot Clavulanate Other (See Comments)    UNSPECIFIED REACTION  Has patient had a PCN reaction causing immediate rash, facial/tongue/throat swelling, SOB or lightheadedness with hypotension: No Has patient had a PCN reaction causing severe rash involving mucus membranes or skin necrosis: No Has patient had a PCN reaction that required hospitalization: No Has patient had a PCN reaction occurring within the last 10 years: Yes If all of the above answers are "NO", then may proceed with Cephalosporin use.  Caused liver failure     FAMILY HISTORY: Family History  Adopted: Yes  Problem Relation Age of Onset  . Bladder Cancer Neg Hx   . Kidney cancer Neg Hx   . Prostate cancer Neg Hx   . Allergic rhinitis Neg Hx   . Angioedema Neg Hx   . Asthma Neg Hx   . Eczema Neg Hx   . Urticaria Neg Hx   . Immunodeficiency Neg Hx     SOCIAL HISTORY: Social History   Socioeconomic History  . Marital status: Married    Spouse name:  Not on file  . Number of children: Not on file  . Years of education: Not on file  . Highest education level: Not on file  Occupational History  . Not on file  Social Needs  . Financial resource strain: Not on file  . Food insecurity    Worry: Not on file    Inability: Not on file  . Transportation needs    Medical: Not on file    Non-medical: Not on file  Tobacco Use  . Smoking status: Former Smoker    Packs/day: 3.00    Years:  50.00    Pack years: 150.00    Types: Cigarettes    Quit date: 04/10/2018    Years since quitting: 1.1  . Smokeless tobacco: Never Used  Substance and Sexual Activity  . Alcohol use: Yes    Alcohol/week: 0.0 standard drinks    Comment: occasional  . Drug use: No  . Sexual activity: Not on file  Lifestyle  . Physical activity    Days per week: Not on file    Minutes per session: Not on file  . Stress: Not on file  Relationships  . Social Herbalist on phone: Not on file    Gets together: Not on file    Attends religious service: Not on file    Active member of club or organization: Not on file    Attends meetings of clubs or organizations: Not on file    Relationship status: Not on file  . Intimate partner violence    Fear of current or ex partner: Not on file    Emotionally abused: Not on file    Physically abused: Not on file    Forced sexual activity: Not on file  Other Topics Concern  . Not on file  Social History Narrative   Patient is adopted   Married, lives with spouse   2 children (boy and girl)   OCCUPATION: Dealer x53yr.  He was in t[employer] yrs in [place] office [place] Constitutional: No fevers, chills, or sweats, no generalized fatigue, change in appetite Eyes: No visual changes, double vision, eye pain Ear, nose and throat: No hearing loss, ear pain, nasal congestion, sore throat Cardiovascular: No chest pain, palpitations Respiratory:  No shortness of breath at  rest or with exertion, wheezes GastrointestinaI: No nausea, vomiting, diarrhea, abdominal pain, fecal incontinence Genitourinary:  No dysuria, urinary retention or frequency Musculoskeletal:  No neck pain, back pain Integumentary: No rash, pruritus, skin lesions Neurological: as above Psychiatric: No depression, insomnia, anxiety Endocrine: No palpitations, fatigue, diaphoresis, mood swings, change in appetite, change in weight, increased thirst Hematologic/Lymphatic:  No purpura, petechiae. Allergic/Immunologic: no itchy/runny eyes, nasal congestion, recent allergic reactions, rashes  PHYSICAL EXAM: Blood pressure 116/82, pulse 73, height 5' 7"  (1.702 m), weight 182 lb 3.2 oz (82.6 kg), SpO2 96 %. General: No acute distress.  Patient appears well-groomed.   Head:  Normocephalic/atraumatic Eyes:  fundi examined but not visualized Neck: supple, no paraspinal tenderness, full range of motion Back: No paraspinal tenderness Heart: regular rate and rhythm Lungs: Clear to auscultation bilaterally. Vascular: No carotid bruits. Neurological Exam: Mental status: alert and oriented to person, place, and time, recent and remote memory intact, fund of knowledge intact, attention and concentration intact, speech fluent and not dysarthric, language intact. Cranial nerves: CN I: not tested CN II: pupils equal, round and reactive to light, visual fields intact CN III, IV, VI:  full range of motion, no nystagmus, no ptosis CN V: facial sensation intact CN VII: upper and lower face symmetric CN VIII: hearing intact CN IX, X: gag intact, uvula midline CN XI: sternocleidomastoid and trapezius muscles intact CN XII: tongue midline Bulk & Tone: normal, no fasciculations. Motor:  4+/5 left upper and lower extremities.  Reduced finger-thumb tapping speed and amplitude.  Left pronator drift.  5/5 on right. Sensation:  Pinprick and vibration sensation intact. Deep Tendon Reflexes:  2+ throughout, toes  downgoing.   Finger to nose testing:  Without  dysmetria.   Heel to shin:  Without dysmetria.   Gait:  Left hemiparetic gait.  Romberg negative.  IMPRESSION: 1.  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 2.  Hyperlipidemia 3.  Hypertension  PLAN: 1.  ASA 34m daily for secondary stroke prevention 2.  Lipitor 870mdaily (LDL at goal less than 70) 3.  Blood pressure control 4.  Mediterranean diet 5.  Routine exercise 6.  Follow up in 3 to 4 months.  Thank you for allowing me to take part in the care of this patient.  AdMetta ClinesDO

## 2019-06-14 NOTE — Progress Notes (Signed)
Lab work from yesterday showing controlled eosinophil levels.  This is good news.  This is showing the Caleb Evans is working.  Patient with persistent rhinitis/allergy-like symptoms.  Patient needs to schedule follow-up with Dr. Ernst Bowler his allergist.  Wyn Quaker, FNP

## 2019-06-15 ENCOUNTER — Ambulatory Visit (HOSPITAL_COMMUNITY): Payer: Medicare Other | Admitting: Occupational Therapy

## 2019-06-15 ENCOUNTER — Ambulatory Visit (INDEPENDENT_AMBULATORY_CARE_PROVIDER_SITE_OTHER): Payer: Medicare Other | Admitting: Neurology

## 2019-06-15 ENCOUNTER — Encounter (HOSPITAL_COMMUNITY): Payer: Self-pay | Admitting: Occupational Therapy

## 2019-06-15 ENCOUNTER — Encounter: Payer: Self-pay | Admitting: Neurology

## 2019-06-15 VITALS — BP 116/82 | HR 73 | Ht 67.0 in | Wt 182.2 lb

## 2019-06-15 DIAGNOSIS — I10 Essential (primary) hypertension: Secondary | ICD-10-CM | POA: Diagnosis not present

## 2019-06-15 DIAGNOSIS — R29818 Other symptoms and signs involving the nervous system: Secondary | ICD-10-CM | POA: Diagnosis not present

## 2019-06-15 DIAGNOSIS — E782 Mixed hyperlipidemia: Secondary | ICD-10-CM | POA: Diagnosis not present

## 2019-06-15 DIAGNOSIS — I639 Cerebral infarction, unspecified: Secondary | ICD-10-CM

## 2019-06-15 DIAGNOSIS — R278 Other lack of coordination: Secondary | ICD-10-CM

## 2019-06-15 NOTE — Patient Instructions (Signed)
We need to work on minimizing stroke risk factors. 1.  Continue aspirin 72m daily 2.  Continue atorvastatin 866mdaily 3.  Continue blood pressure medications 4.  Routine exercise 5.  Mediterranean diet (see below) 6.  Follow up in 3 to 4 months.   Mediterranean Diet A Mediterranean diet refers to food and lifestyle choices that are based on the traditions of countries located on the [employer] way of eating has been shown to help prevent certain conditions and improve outcomes for people who have chronic diseases, like kidney disease and heart disease. What are tips for following this plan? Lifestyle  Cook and eat meals together with your family, when possible.  Drink enough fluid to keep your urine clear or pale yellow.  Be physically active every day. This includes: ? Aerobic exercise like running or swimming. ? Leisure activities like gardening, walking, or housework.  Get 7-8 hours of sleep each night.  If recommended by your health care provider, drink red wine in moderation. This means 1 glass a day for nonpregnant women and 2 glasses a day for men. A glass of wine equals 5 oz (150 mL). Reading food labels   Check the serving size of packaged foods. For foods such as rice and pasta, the serving size refers to the amount of cooked product, not dry.  Check the total fat in packaged foods. Avoid foods that have saturated fat or trans fats.  Check the ingredients list for added sugars, such as corn syrup. Shopping  At the grocery store, buy most of your food from the areas near the walls of the store. This includes: ? Fresh fruits and vegetables (produce). ? Grains, beans, nuts, and seeds. Some of these may be available in unpackaged forms or large amounts (in bulk). ? Fresh seafood. ? Poultry and eggs. ? Low-fat dairy products.  Buy whole ingredients instead of prepackaged foods.  Buy fresh fruits and vegetables in-season from local farmers markets.   Buy frozen fruits and vegetables in resealable bags.  If you do not have access to quality fresh seafood, buy precooked frozen shrimp or canned fish, such as tuna, salmon, or sardines.  Buy small amounts of raw or cooked vegetables, salads, or olives from the deli or salad bar at your store.  Stock your pantry so you always have certain foods on hand, such as olive oil, canned tuna, canned tomatoes, rice, pasta, and beans. Cooking  Cook foods with extra-virgin olive oil instead of using butter or other vegetable oils.  Have meat as a side dish, and have vegetables or grains as your main dish. This means having meat in small portions or adding small amounts of meat to foods like pasta or stew.  Use beans or vegetables instead of meat in common dishes like chili or lasagna.  Experiment with different cooking methods. Try roasting or broiling vegetables instead of steaming or sauteing them.  Add frozen vegetables to soups, stews, pasta, or rice.  Add nuts or seeds for added healthy fat at each meal. You can add these to yogurt, salads, or vegetable dishes.  Marinate fish or vegetables using olive oil, lemon juice, garlic, and fresh herbs. Meal planning   Plan to eat 1 vegetarian meal one day each week. Try to work up to 2 vegetarian meals, if possible.  Eat seafood 2 or more times a week.  Have healthy snacks readily available, such as: ? Vegetable sticks with hummus. ? [place]ogurt. ? Fruit and nut trail mix.  Eat balanced meals throughout the week. This includes: ? Fruit: 2-3 servings a day ? Vegetables: 4-5 servings a day ? Low-fat dairy: 2 servings a day ? Fish, poultry, or lean meat: 1 serving a day ? Beans and legumes: 2 or more servings a week ? Nuts and seeds: 1-2 servings a day ? Whole grains: 6-8 servings a day ? Extra-virgin olive oil: 3-4 servings a day  Limit red meat and sweets to only a few servings a month What are my food choices?  Mediterranean diet ?  Recommended  Grains: Whole-grain pasta. Brown rice. Bulgar wheat. Polenta. Couscous. Whole-wheat bread. [place].  Vegetables: Artichokes. Beets. Broccoli. Cabbage. Carrots. Eggplant. Green beans. Chard. Kale. Spinach. Onions. Leeks. Peas. Squash. Tomatoes. Peppers. Radishes.  Fruits: Apples. Apricots. Avocado. Berries. Bananas. Cherries. Dates. Figs. Grapes. Lemons. Melon. Oranges. Peaches. Plums. Pomegranate.  Meats and other protein foods: Beans. Almonds. Sunflower seeds. Pine nuts. Peanuts. [place]. Salmon. Scallops. Shrimp. [place]. Tilapia. Clams. Oysters. Eggs.  Dairy: Low-fat milk. Cheese. Greek yogurt.  Beverages: Water. Red wine. Herbal tea.  Fats and oils: Extra virgin olive oil. Avocado oil. Grape seed oil.  Sweets and desserts: [place] yogurt with honey. Baked apples. Poached pears. Trail mix.  Seasoning and other foods: Basil. Cilantro. Coriander. Cumin. Mint. Parsley. Sage. Rosemary. Tarragon. Garlic. Oregano. Thyme. Pepper. Balsalmic vinegar. Tahini. Hummus. Tomato sauce. Olives. Mushrooms. ? Limit these  Grains: Prepackaged pasta or rice dishes. Prepackaged cereal with added sugar.  Vegetables: Deep fried potatoes (french fries).  Fruits: Fruit canned in syrup.  Meats and other protein foods: Beef. Pork. Lamb. Poultry with skin. Hot dogs. Berniece Salines.  Dairy: Ice cream. Sour cream. Whole milk.  Beverages: Juice. Sugar-sweetened soft drinks. Beer. Liquor and spirits.  Fats and oils: Butter. Canola oil. Vegetable oil. Beef fat (tallow). Lard.  Sweets and desserts: Cookies. Cakes. Pies. Candy.  Seasoning and other foods: Mayonnaise. Premade sauces and marinades. The items listed may not be a complete list. Talk with your dietitian about what dietary choices are right for you. Summary  The Mediterranean diet includes both food and lifestyle choices.  Eat a variety of fresh fruits and vegetables, beans, nuts, seeds, and whole grains.  Limit the amount of red meat and  sweets that you eat.  Talk with your health care provider about whether it is safe for you to drink red wine in moderation. This means 1 glass a day for nonpregnant women and 2 glasses a day for men. A glass of wine equals 5 oz (150 mL). This information is not intended to replace advice given to you by your health care provider. Make sure you discuss any questions you have with your health care provider. Document Released: 03/19/2016 Document Revised: 03/26/2016 Document Reviewed: 03/19/2016 Elsevier Patient Education  2020 [employer].

## 2019-06-15 NOTE — Therapy (Signed)
[place], [place], [address] Phone: [phone]   Fax:  [phone]  Occupational Therapy Treatment  Patient Details  Name: Caleb Evans MRN: [id number] Date of Birth: 08/26/1950 Referring Provider (OT): Dr. Irwin Brakeman   Encounter Date: 06/15/2019  OT End of Session - 06/15/19 1111    Visit Number  2    Number of Visits  12    Date for OT Re-Evaluation  07/25/19   mini reassess on 07/04/19   Authorization Type  Medicare - progress note due on 10th visit    Authorization - Visit Number  2    Authorization - Number of Visits  10    OT Start Time  1030    OT Stop Time  1109    OT Time Calculation (min)  39 min    Activity Tolerance  Patient tolerated treatment well    Behavior During Therapy  [facility] for tasks assessed/performed       Past Medical History:  Diagnosis Date  . AAA (abdominal aortic aneurysm) ([place])   . Adrenal nodule ([place])   . Asthma   . Bladder mass   . Bladder neoplasm   . Cancer [facility])    bladder cancer  . COPD (chronic obstructive pulmonary disease) ([place])   . Crohn's disease ([place])   . Diverticulitis   . GERD (gastroesophageal reflux disease)    takes Nexium off and on  . Over weight   . Skin lesion   . Thoracic aortic aneurysm ([place])   . Weak urinary stream     Past Surgical History:  Procedure Laterality Date  . APPENDECTOMY    . CHOLECYSTECTOMY    . LAPAROSCOPIC PARTIAL COLECTOMY  01/18/2018   ERAS PATHWAY  . LAPAROSCOPIC PARTIAL COLECTOMY N/A 01/18/2018   Procedure: LAPAROSCOPIC PARTIAL COLECTOMY ERAS PATHWAY;  Surgeon: Stark Klein, MD;  Location: [place];  Service: General;  Laterality: N/A;  . TONSILLECTOMY      There were no vitals filed for this visit.  Subjective Assessment - 06/15/19 1029    Subjective   S: I did that washcloth thing a lot in the hospital.    Currently in Pain?  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      Precautions   Precautions  None               OT Treatments/Exercises (OP) - 06/15/19 1031      Exercises   Exercises  Hand      Hand Exercises   Hand Gripper with Large Beads  all beads gripper at 50#    Hand Gripper with Medium Beads  all beads gripper at 42#    Sponges  16, 19, 23      Neurological Re-education Exercises   Shoulder Flexion  Strengthening;10 reps   2#   Shoulder ABduction  Strengthening;10 reps   2#   Shoulder Protraction  Strengthening;10 reps   2#   Shoulder Horizontal ABduction  Strengthening;10 reps   2#   Shoulder External Rotation  Strengthening;10 reps   2#   Shoulder Internal Rotation  Strengthening;10 reps   2#   Other Exercises 1  green therapy ball strengthening: chest press, flexion, 10X each      Fine Motor Coordination (Hand/Wrist)   Fine Motor Coordination  Manipulating coins    Manipulating coins  Pt holding 1, 2, and 3 coins in palm at a time, working on palm to fingertip translation to place in slotted container. Min difficulty with one coin, mod to max difficulty with 2 and 3 coins-often dropping remaining coins.              OT Education - 06/15/19 1111    Education Details  educated on coin manipulation for fine motor coordination practice    Person(s) Educated  Patient    Methods  Explanation;Demonstration;Handout    Comprehension  Verbalized understanding;Returned demonstration       OT Short Term Goals - 06/15/19 1115      OT SHORT TERM GOAL #1   Title  Patient will be educated on a HEP for improved functional use of his left upper extremity with functional tasks.    Time  3    Period  Weeks    Status  On-going    Target Date  07/04/19      OT SHORT TERM GOAL #2   Title  Paitent will improve LUE strength to 4-/5 or better for improved ability to lift pots and pans when cooking.    Time  3    Period  Weeks    Status   On-going      OT SHORT TERM GOAL #3   Title  Patient will increase LUE GMC from fair + to good - as evidence by increaesd accuracy with finger to nose test.    Time  3    Period  Weeks    Status  On-going      OT SHORT TERM GOAL #4   Title  Patient will increase left grip strength by 5# and pinch strength by 2# for improved ability to maintain grasp on household items.    Time  3    Period  Weeks    Status  On-going      OT SHORT TERM GOAL #5   Title  Patient will increase fine motor coordination as evidence by improve completion time on nine hole peg test by 10".    Time  3    Period  Weeks    Status  On-going        OT Long Term Goals - 06/15/19 1115      OT LONG TERM GOAL #1   Title  Patient will return to prior level of function with all B/IADLs and leisure activities using his left arm as dominant.    Time  6    Period  Weeks    Status  On-going      OT LONG TERM GOAL #2   Title  Patient will improve GMC to WNL in his left arm for improved safety and accuracy when completing daily tasks and riding his motorcycle.    Time  6    Period  Weeks    Status  On-going      OT LONG TERM GOAL #3   Title  Patient will improve left upper extremity strength to 5/5 for increased ability to lift items overhead when working on his motorcycle and completing house hold tasks.    Time  6    Period  Weeks    Status  On-going      OT LONG TERM GOAL #4   Title  Patient will improve left hand grip strength by 10# and pinch strength by 3# or more in his dominant left hand in order to  open containers and maintain grasp on house hold items.    Time  6    Period  Weeks    Status  On-going      OT LONG TERM GOAL #5   Title  Patient will improve left hand fine motor coordination to WNL as evidence by completing the nine hole peg test in 24" or less for improve ability to fasten buttons and manipulate change.    Time  6    Period  Weeks    Status  On-going            Plan -  06/15/19 1112    Clinical Impression Statement  A: Initiated LUE exercises working on strengthening and control of LUE during tasks. Pt fatigues easily, requiring occasional rest breaks. Completed gripper exercise working on hand strength. Also incorporated fine motor tasks with coins and sponges-max difficulty holding objects in palm during manipulation. Verbal cuing for form and technique.    Body Structure / Function / Physical Skills  ADL;Coordination;GMC;UE functional use;IADL;Dexterity;FMC;Proprioception;Strength    Plan  P: Continue with LUE strengthening and add to HEP, follow up on coin manipulation practice. Add small beads with hand gripper       Patient will benefit from skilled therapeutic intervention in order to improve the following deficits and impairments:   Body Structure / Function / Physical Skills: ADL, Coordination, GMC, UE functional use, IADL, Dexterity, FMC, Proprioception, Strength       Visit Diagnosis: Other lack of coordination  Other symptoms and signs involving the nervous system    Problem List Patient Active Problem List   Diagnosis Date Noted  . Medication management 06/13/2019  . CVA (cerebral vascular accident) ([place]) 05/29/2019  . Former smoker 05/29/2019  . Aneurysm of thoracic aorta ([place]) 11/22/2017  . Hyperlipidemia 11/22/2017  . AAA (abdominal aortic aneurysm) without rupture ([place]) 11/22/2017  . Seasonal and perennial allergic rhinitis 04/19/2017  . Severe persistent asthma, uncomplicated 54/56/2563  . GERD (gastroesophageal reflux disease) 04/08/2016  . Chronic nonseasonal allergic rhinitis due to fungal spores 01/17/2014  . Asthma-COPD overlap syndrome ([place]) 01/17/2014  . Mild chronic obstructive pulmonary disease ([place]) 01/17/2014   Guadelupe Sabin, OTR/L  [phone] 06/15/2019, 11:16 AM  [place], [place], [address] Phone: [phone]   Fax:  [phone]  Name:  LUTHER SPRINGS MRN: [id number] Date of Birth: Jan 28, 1951

## 2019-06-16 ENCOUNTER — Ambulatory Visit (INDEPENDENT_AMBULATORY_CARE_PROVIDER_SITE_OTHER): Payer: Medicare Other | Admitting: Allergy & Immunology

## 2019-06-16 ENCOUNTER — Encounter: Payer: Self-pay | Admitting: Allergy & Immunology

## 2019-06-16 ENCOUNTER — Encounter (HOSPITAL_COMMUNITY): Payer: Self-pay | Admitting: Physical Therapy

## 2019-06-16 ENCOUNTER — Ambulatory Visit (HOSPITAL_COMMUNITY): Payer: Medicare Other | Admitting: Physical Therapy

## 2019-06-16 ENCOUNTER — Other Ambulatory Visit: Payer: Self-pay

## 2019-06-16 VITALS — BP 108/72 | HR 77 | Temp 97.3°F | Resp 18 | Ht 67.0 in

## 2019-06-16 DIAGNOSIS — J302 Other seasonal allergic rhinitis: Secondary | ICD-10-CM | POA: Diagnosis not present

## 2019-06-16 DIAGNOSIS — R262 Difficulty in walking, not elsewhere classified: Secondary | ICD-10-CM

## 2019-06-16 DIAGNOSIS — M6281 Muscle weakness (generalized): Secondary | ICD-10-CM

## 2019-06-16 DIAGNOSIS — J4550 Severe persistent asthma, uncomplicated: Secondary | ICD-10-CM

## 2019-06-16 DIAGNOSIS — R2681 Unsteadiness on feet: Secondary | ICD-10-CM

## 2019-06-16 DIAGNOSIS — H1010 Acute atopic conjunctivitis, unspecified eye: Secondary | ICD-10-CM

## 2019-06-16 DIAGNOSIS — J3089 Other allergic rhinitis: Secondary | ICD-10-CM | POA: Diagnosis not present

## 2019-06-16 DIAGNOSIS — I639 Cerebral infarction, unspecified: Secondary | ICD-10-CM | POA: Diagnosis not present

## 2019-06-16 DIAGNOSIS — R29818 Other symptoms and signs involving the nervous system: Secondary | ICD-10-CM | POA: Diagnosis not present

## 2019-06-16 MED ORDER — IPRATROPIUM BROMIDE 0.06 % NA SOLN: 2.0000 | Freq: Four times a day (QID) | NASAL | 12 refills | Status: DC

## 2019-06-16 NOTE — Therapy (Signed)
[place], [place], [address] Phone: [phone]   Fax:  [phone]  Physical Therapy Treatment  Patient Details  Name: Caleb Evans MRN: [id number] Date of Birth: [birth year] Referring Provider (PT): Irwin Brakeman, MD   Encounter Date: 06/16/2019  PT End of Session - 06/16/19 1355    Visit Number  4    Number of Visits  18    Date for PT Re-Evaluation  07/25/19    Authorization Type  Medicare; Secondary: Generic commercial    Authorization Time Period  06/06/19 to 07/28/19    Authorization - Visit Number  4    Authorization - Number of Visits  10    PT Start Time  3419    PT Stop Time  3790    PT Time Calculation (min)  39 min    Equipment Utilized During Treatment  Gait belt    Activity Tolerance  Patient tolerated treatment well    Behavior During Therapy  [facility] for tasks assessed/performed       Past Medical History:  Diagnosis Date  . AAA (abdominal aortic aneurysm) ([place])   . Adrenal nodule ([place])   . Asthma   . Bladder mass   . Bladder neoplasm   . Cancer [facility])    bladder cancer  . COPD (chronic obstructive pulmonary disease) ([place])   . Crohn's disease ([place])   . Diverticulitis   . GERD (gastroesophageal reflux disease)    takes Nexium off and on  . Over weight   . Skin lesion   . Thoracic aortic aneurysm ([place])   . Weak urinary stream     Past Surgical History:  Procedure Laterality Date  . APPENDECTOMY    . CHOLECYSTECTOMY    . LAPAROSCOPIC PARTIAL COLECTOMY  01/18/2018   ERAS PATHWAY  . LAPAROSCOPIC PARTIAL COLECTOMY N/A 01/18/2018   Procedure: LAPAROSCOPIC PARTIAL COLECTOMY ERAS PATHWAY;  Surgeon: Stark Klein, MD;  Location: [place];  Service: General;  Laterality: N/A;  . TONSILLECTOMY      There were no vitals filed for this visit.  Subjective Assessment - 06/16/19 1311    Subjective  Patient reports he has not been using his cane and has been walking well. He has been  getting light headed from allergies. His hand is also improving.    Pertinent History  AAA    Patient Stated Goals  to walk again    Currently in Pain?  No/denies                       [facility] Adult PT Treatment/Exercise - 06/16/19 0001      Knee/Hip Exercises: Machines for Strengthening   Other Machine  bodycraft, legpress, 40#, 2x10 reps      Knee/Hip Exercises: Standing   Forward Step Up  Left;3 sets;10 reps;Step Height: 6"   verbal cueing for slow, controlled decent   Forward Step Up Limitations  opposite side knee drive x 5" holds          Balance Exercises - 06/16/19 1352      Balance Exercises: Standing   Balance Beam  tandem gait 6x16 feet    Sidestepping  Theraband;Other (comment)   4x15 feet   Step Over Hurdles / Cones  hurdles 6", 12", 6x15 feet        PT Education - 06/16/19 1354    Education Details  educated on completion of HEP and proper techniques    Person(s) Educated  Patient    Methods  Explanation    Comprehension  Verbalized understanding       PT Short Term Goals - 06/13/19 1356      PT SHORT TERM GOAL #1   Title  Pt will consistently perform HEP at least 3x/week to improve strength, gait, and ability to perform recreational activities.    Time  3    Period  Weeks    Status  On-going    Target Date  06/27/19      PT SHORT TERM GOAL #2   Title  Pt will improve 5x STS test to 15 sec, no UE assist, no AD, and equal weightbearing throughout BLE to demo improved functional strength when rising from seated surfaces.    Time  3    Period  Weeks    Status  On-going      PT SHORT TERM GOAL #3   Title  Pt will ambulate 0.55ms with LRAD to reduce risk for falls and improve ability to navigate limited community distances.    Time  3    Period  Weeks    Status  On-going        PT Long Term Goals - 06/13/19 1356      PT LONG TERM GOAL #1   Title  Pt will demonstrate LLE 4/5 strength to reduce risk for falls, improve gait,  improve transfers, and allow pt to return to normal recreational activities.    Time  7    Period  Weeks    Status  On-going      PT LONG TERM GOAL #2   Title  Pt will ambulate 1.247m with LRAD to reduce risk for falls and improve ability to navigate throughout the community.    Time  7    Period  Weeks    Status  On-going      PT LONG TERM GOAL #3   Title  Pt will improve FGA to 19 to decrease risk for falls with yard work, working on cars, and vaEnvironmental health practitioner   Time  7    Period  Weeks    Status  On-going      PT LONG TERM GOAL #4   Title  Pt will perform SLS equally, bilaterally to improve gait pattern, stair negotiation, and reduce risk for falls with activity.    Time  7    Period  Weeks    Status  On-going            Plan - 06/16/19 1356    Clinical Impression Statement  Patient continues to demonstrate impaired dynamic and static balance during exercises today. He fatigues quickly with LLE strengthening exercises but is able to complete more sets after frequent rest breaks. He becomes SOB following more dynamic exercises and requires rest breaks. He is able to tolerate more strengthening exercise than previous sessions and requires minimal verbal cueing for positioning and mechanics. Patient will continue to benefit from skilled physical therapy in order to return to prior level of function.    Personal Factors and Comorbidities  Comorbidity 2    Comorbidities  AAA, COPD, asthma    Examination-Activity Limitations  Bed Mobility;Locomotion Level;Stairs;Transfers    Examination-Participation Restrictions  Driving;Yard Work    Stability/Clinical Decision Making  Stable/Uncomplicated    Rehab Potential  Good    PT Frequency  3x / week    PT Duration  --   7 weeks with 1 week vacation   PT Treatment/Interventions  ADLs/Self Care Home Management;Aquatic Therapy;Biofeedback;Electrical Stimulation;DME Instruction;Gait training;Stair training;Functional mobility  training;Therapeutic activities;Therapeutic exercise;Balance training;Neuromuscular re-education;Patient/family education;Orthotic Fit/Training;Manual techniques;Passive range of motion;Dry needling;Taping;Joint Manipulations    PT Next Visit Plan  Strengthening exercises for LLE. Progress balance and strengthening as able.    PT Home Exercise Plan  Eval: sidelying hip abduction, bridges, LAQ with 5 sec hold; 11/3: tandem stance, chair taps       Patient will benefit from skilled therapeutic intervention in order to improve the following deficits and impairments:  Abnormal gait, Decreased activity tolerance, Decreased balance, Decreased coordination, Decreased endurance, Decreased mobility, Decreased strength, Difficulty walking, Impaired perceived functional ability  Visit Diagnosis: Other symptoms and signs involving the nervous system  Muscle weakness (generalized)  Difficulty in walking, not elsewhere classified  Unsteadiness on feet     Problem List Patient Active Problem List   Diagnosis Date Noted  . Medication management 06/13/2019  . CVA (cerebral vascular accident) ([place]) 05/29/2019  . Former smoker 05/29/2019  . Aneurysm of thoracic aorta ([place]) 11/22/2017  . Hyperlipidemia 11/22/2017  . AAA (abdominal aortic aneurysm) without rupture ([place]) 11/22/2017  . Seasonal and perennial allergic rhinitis 04/19/2017  . Severe persistent asthma, uncomplicated 30/16/0109  . GERD (gastroesophageal reflux disease) 04/08/2016  . Chronic nonseasonal allergic rhinitis due to fungal spores 01/17/2014  . Asthma-COPD overlap syndrome ([place]) 01/17/2014  . Mild chronic obstructive pulmonary disease ([place]) 01/17/2014    2:02 PM, 06/16/19 Mearl Latin PT, DPT Physical Therapist at [place], [place], [address] Phone: [phone]   Fax:  [phone]  Name: Caleb Evans MRN:  [id number] Date of Birth: March 25, 1951

## 2019-06-16 NOTE — Patient Instructions (Addendum)
1. Severe persistent asthma, uncomplicated - Spirometry looked slightly lower today. - I am going to start you on a prednisone burst.  - Call us on Monday with an update. - I will let your Pulmonology Team know that we saw you.  - Daily controller medication(s): Symbicort 160/4.5 two puffs twice daily + Spiriva 2.75mg one puff once daily + Singulair (montelukast) 152mdaily + Nucala 10076monthly - Rescue medications: albuterol 4 puffs every 4-6 hours as needed - Asthma control goals:  * Full participation in all desired activities (may need albuterol before activity) * Albuterol use two time or less a week on average (not counting use with activity) * Cough interfering with sleep two time or less a month * Oral steroids no more than once a year * No hospitalizations  2.Chronic nonseasonal allergic rhinitis - [facility] now. - Add on nasal ipratropium one spray per nostril up to three times daily (this can be over drying, so watch out). - Use nasal saline rinses. - Continue with allergy shots at the same schedule, but notice whether your symptoms get worse after a couple of weeks. - We can change your injections to every two weeks and freeze them there if you are interested in that.  - Continue with the eye drops as needed.    3. Return in about 6 months (around 12/14/2019). This can be an in-person, a virtual Webex or a telephone follow up visit.   Please inform us [place] any Emergency Department visits, hospitalizations, or changes in symptoms. Call us [place] going to the ED for breathing or allergy symptoms since we might be able to fit you in for a sick visit. Feel free to contact us [place] with any questions, problems, or concerns.  It was a pleasure to see you again today! Glad you are making such a speedy recovery!   Websites that have reliable patient information: 1. American Academy of Asthma, Allergy, and Immunology: www.aaaai.org 2. Food Allergy Research and Education  (FARE): foodallergy.org 3. Mothers of Asthmatics: http://www.asthmacommunitynetwork.org 4. American College of Allergy, Asthma, and Immunology: www.acaai.org  "Like" us [place] Facebook and Instagram for our latest updates!      Make sure you are registered to vote! If you have moved or changed any of your contact information, you will need to get this updated before voting!  In some cases, you MAY be able to register to vote online: [email or web address].it

## 2019-06-16 NOTE — Progress Notes (Signed)
FOLLOW UP  Date of Service/Encounter:  06/16/19   Assessment:   Severe persistent asthma, uncomplicated - followed by Pulmonology  Chronic nonseasonal allergic rhinitis (trees, weeds, mold, dust mite) - on allergen immunotherapy  Previous World Location manager  Recent stroke    Plan/Recommendations:   1. Severe persistent asthma, uncomplicated - Spirometry looked slightly lower today. - I am going to start you on a prednisone burst.  - Call us on Monday with an update. - I will let your Pulmonology Team know that we saw you.  - Daily controller medication(s): Symbicort 160/4.5 two puffs twice daily + Spiriva 2.50mg one puff once daily + Singulair (montelukast) 149mdaily + Nucala 10050monthly - Rescue medications: albuterol 4 puffs every 4-6 hours as needed - Asthma control goals:  * Full participation in all desired activities (may need albuterol before activity) * Albuterol use two time or less a week on average (not counting use with activity) * Cough interfering with sleep two time or less a month * Oral steroids no more than once a year * No hospitalizations  2.Chronic nonseasonal allergic rhinitis - [facility] the [facility] now. - Add on nasal ipratropium one spray per nostril up to three times daily (this can be over drying, so watch out). - Use nasal saline rinses. - Continue with allergy shots at the same schedule, but notice whether your symptoms get worse after a couple of weeks. - We can change your injections to every two weeks and freeze them there if you are interested in that.  - Continue with the eye drops as needed.    3. Return in about 6 months (around 12/14/2019). This can be an in-person, a virtual Webex or a telephone follow up visit.   Subjective:   Caleb Evans a 68 27o. male presenting today for follow up of  Chief Complaint  Patient presents with  . Asthma  . Allergic Reaction    Caleb Evans a history of  the following: Patient Active Problem List   Diagnosis Date Noted  . Medication management 06/13/2019  . CVA (cerebral vascular accident) (HCCCamuy0/19/2020  . Former smoker 05/29/2019  . Aneurysm of thoracic aorta (HCCWeekapaug4/15/2019  . Hyperlipidemia 11/22/2017  . AAA (abdominal aortic aneurysm) without rupture (HCCCentral High4/15/2019  . Seasonal and perennial allergic rhinitis 04/19/2017  . Severe persistent asthma, uncomplicated 03/27/99/1749 GERD (gastroesophageal reflux disease) 04/08/2016  . Chronic nonseasonal allergic rhinitis due to fungal spores 01/17/2014  . Asthma-COPD overlap syndrome (HCC[place]6/05/2014  . Mild chronic obstructive pulmonary disease (HCCMarthas[place]6/05/2014    History obtained from: chart review and patient.  Caleb Evans a 68 61o. male presenting for a follow up visit.  He was last seen in June 2020.  At that time, we continued the Symbicort as well as the Spiriva and Singulair.  We also continue with albuterol 4 puffs every 4-6 hours as needed.  We continued him on his nose spray as well as allergy shots and the eyedrops.  Since last visit, he has unfortunately had a rather eventful course.  He was admitted to the hospital after experiencing a stroke.  This was treated at the hospital and he is recovering. He is now doing physical therapy several times per week.   He feels that the breathing problems started when he was in the hospital during his stroke. He feels that he needs to get something to drink in the throat ot break it up.  He has tried a multitude of nasal sprays in the past.   Asthma/Respiratory Symptom History: He was followed by Dr. Rolla Etienne. He is now seeing someone else in the Pulmonology office. He goes to [employer] to get the [place] shot monthly. He was started on that in September 2019. He did feel that it was helping. But he overall feels good. His wife is not sure that it has helped much, but he thinks that it has bene helpful. Regardless, he is going to continue with  this. Today he reports that he has been using his rescue medicines much more frequently over the past week or so.   Allergic Rhinitis Symptom History: He reports that the Truett Perna is not working as well as it used to. It tends to only work for 6-7 hours. He has never been on Dymista.  But he has bene on Astelin in combination with these nose sprays. He has never been on nasal ipratropium. He is unsure whether the spacing to every three weeks has made his allergic rhinitis symptoms worse, but he is going to look into that.  Otherwise, there have been no changes to his past medical history, surgical history, family history, or social history.    Review of Systems  Constitutional: Negative.  Negative for chills, fever, malaise/fatigue and weight loss.  HENT: Negative.  Negative for congestion, ear discharge and ear pain.   Eyes: Negative for pain, discharge and redness.  Respiratory: Negative for cough, sputum production, shortness of breath and wheezing.   Cardiovascular: Negative.  Negative for chest pain and palpitations.  Gastrointestinal: Negative for abdominal pain, heartburn, nausea and vomiting.  Skin: Negative.  Negative for itching and rash.  Neurological: Negative for dizziness and headaches.  Endo/Heme/Allergies: Negative for environmental allergies. Does not bruise/bleed easily.       Objective:   Blood pressure 108/72, pulse 77, temperature (!) 97.3 F (36.3 C), temperature source Temporal, resp. rate 18, height 5' 7"  (1.702 m), SpO2 97 %. Body mass index is 28.54 kg/m.   Physical Exam:  Physical Exam  Constitutional: He appears well-developed.  Pleasant male. Cooperative with the exam.   HENT:  Head: Normocephalic and atraumatic.  Right Ear: Tympanic membrane, external ear and ear canal normal.  Left Ear: Tympanic membrane and ear canal normal.  Nose: No mucosal edema, rhinorrhea, nasal deformity or septal deviation. No epistaxis. Right sinus exhibits no maxillary  sinus tenderness and no frontal sinus tenderness. Left sinus exhibits no maxillary sinus tenderness and no frontal sinus tenderness.  Mouth/Throat: Uvula is midline and oropharynx is clear and moist. Mucous membranes are not pale and not dry.  Eyes: Pupils are equal, round, and reactive to light. Conjunctivae and EOM are normal. Right eye exhibits no chemosis and no discharge. Left eye exhibits no chemosis and no discharge. Right conjunctiva is not injected. Left conjunctiva is not injected.  Cardiovascular: Normal rate, regular rhythm and normal heart sounds.  Respiratory: Effort normal and breath sounds normal. No accessory muscle usage. No tachypnea. No respiratory distress. He has no wheezes. He has no rhonchi. He has no rales. He exhibits no tenderness.  Moving air well in all lung fields. No increased work of breathing noted.   Lymphadenopathy:    He has no cervical adenopathy.  Neurological: He is alert.  Skin: No abrasion, no petechiae and no rash noted. Rash is not papular, not vesicular and not urticarial. No erythema. No pallor.  No urticarial or eczematous lesions noted.  Psychiatric: He has a normal mood  and affect.     Diagnostic studies:    Spirometry: results abnormal (FEV1: 1.90/68%, FVC: 2.59/65%, FEV1/FVC: 73%).    Spirometry consistent with possible disease. Overall his values were lower compared to his last spirometry.  FEV1 decreased from 2.20-1.90 and his FVC decreased from 2.91-2.59.  Allergy Studies: none      [name], MD  [facility]

## 2019-06-19 ENCOUNTER — Encounter (HOSPITAL_COMMUNITY): Payer: Self-pay | Admitting: Physical Therapy

## 2019-06-19 ENCOUNTER — Other Ambulatory Visit: Payer: Self-pay

## 2019-06-19 ENCOUNTER — Encounter: Payer: Self-pay | Admitting: Allergy & Immunology

## 2019-06-19 ENCOUNTER — Ambulatory Visit (HOSPITAL_COMMUNITY): Payer: Medicare Other | Admitting: Physical Therapy

## 2019-06-19 ENCOUNTER — Encounter (HOSPITAL_COMMUNITY): Payer: Self-pay

## 2019-06-19 ENCOUNTER — Telehealth: Payer: Self-pay | Admitting: Pulmonary Disease

## 2019-06-19 ENCOUNTER — Ambulatory Visit (HOSPITAL_COMMUNITY): Payer: Medicare Other

## 2019-06-19 DIAGNOSIS — M6281 Muscle weakness (generalized): Secondary | ICD-10-CM

## 2019-06-19 DIAGNOSIS — R29818 Other symptoms and signs involving the nervous system: Secondary | ICD-10-CM

## 2019-06-19 DIAGNOSIS — R2681 Unsteadiness on feet: Secondary | ICD-10-CM

## 2019-06-19 DIAGNOSIS — R262 Difficulty in walking, not elsewhere classified: Secondary | ICD-10-CM

## 2019-06-19 DIAGNOSIS — R278 Other lack of coordination: Secondary | ICD-10-CM

## 2019-06-19 NOTE — Telephone Encounter (Signed)
Nucala Order: 151m #1 Vial Order Date: 06/19/2019 Expected date of arrival: 06/20/2019 Ordered by: LDesmond Dike [facility]: BNigel Mormon

## 2019-06-19 NOTE — Therapy (Signed)
[place], [place], [address] Phone: [phone]   Fax:  [phone]  Physical Therapy Treatment  Patient Details  Name: Caleb Evans MRN: [id number] Date of Birth: Dec 02, 1950 Referring Provider (PT): Irwin Brakeman, MD   Encounter Date: 06/19/2019  PT End of Session - 06/19/19 1523    Visit Number  6    Number of Visits  18    Date for PT Re-Evaluation  07/25/19    Authorization Type  Medicare; Secondary: Generic commercial    Authorization Time Period  06/06/19 to 07/28/19    Authorization - Visit Number  6    Authorization - Number of Visits  10    PT Start Time  8264    PT Stop Time  1515    PT Time Calculation (min)  43 min    Equipment Utilized During Treatment  Gait belt    Activity Tolerance  Patient tolerated treatment well    Behavior During Therapy  [facility] for tasks assessed/performed       Past Medical History:  Diagnosis Date  . AAA (abdominal aortic aneurysm) ([place])   . Adrenal nodule ([place])   . Asthma   . Bladder mass   . Bladder neoplasm   . Cancer [facility])    bladder cancer  . COPD (chronic obstructive pulmonary disease) ([place])   . Crohn's disease ([place])   . Diverticulitis   . GERD (gastroesophageal reflux disease)    takes Nexium off and on  . Over weight   . Skin lesion   . Thoracic aortic aneurysm ([place])   . Weak urinary stream     Past Surgical History:  Procedure Laterality Date  . APPENDECTOMY    . CHOLECYSTECTOMY    . LAPAROSCOPIC PARTIAL COLECTOMY  01/18/2018   ERAS PATHWAY  . LAPAROSCOPIC PARTIAL COLECTOMY N/A 01/18/2018   Procedure: LAPAROSCOPIC PARTIAL COLECTOMY ERAS PATHWAY;  Surgeon: Stark Klein, MD;  Location: [place];  Service: General;  Laterality: N/A;  . TONSILLECTOMY      There were no vitals filed for this visit.  Subjective Assessment - 06/19/19 1522    Subjective  "Doing ok, been walking more which is good". Patient states he has not been using cane as  much. No bouts of light headedness recently. No pain, just "out of it" due to allergy meds.    Pertinent History  AAA    Patient Stated Goals  to walk again    Currently in Pain?  No/denies                       [facility] Adult PT Treatment/Exercise - 06/19/19 0001      Knee/Hip Exercises: Standing   Forward Step Up  Left;3 sets;10 reps;Step Height: 6"    Forward Step Up Limitations  opposite side knee drive x 5" holds    Functional Squat  15 reps    Other Standing Knee Exercises  tandem stance; 3 x 30" on foam           Balance Exercises - 06/19/19 1453      Balance Exercises: Standing   Balance Beam  tandem gait 6x16 feet    Sidestepping  Theraband;Other (comment);5 reps    Step Over Hurdles / Cones  hurdles 6", 12", 6x15 feet    Other Standing Exercises  Ambulation with head turns 6 x 15 feet           PT Short Term Goals -  06/13/19 1356      PT SHORT TERM GOAL #1   Title  Pt will consistently perform HEP at least 3x/week to improve strength, gait, and ability to perform recreational activities.    Time  3    Period  Weeks    Status  On-going    Target Date  06/27/19      PT SHORT TERM GOAL #2   Title  Pt will improve 5x STS test to 15 sec, no UE assist, no AD, and equal weightbearing throughout BLE to demo improved functional strength when rising from seated surfaces.    Time  3    Period  Weeks    Status  On-going      PT SHORT TERM GOAL #3   Title  Pt will ambulate 0.72ms with LRAD to reduce risk for falls and improve ability to navigate limited community distances.    Time  3    Period  Weeks    Status  On-going        PT Long Term Goals - 06/13/19 1356      PT LONG TERM GOAL #1   Title  Pt will demonstrate LLE 4/5 strength to reduce risk for falls, improve gait, improve transfers, and allow pt to return to normal recreational activities.    Time  7    Period  Weeks    Status  On-going      PT LONG TERM GOAL #2   Title  Pt will  ambulate 1.255m with LRAD to reduce risk for falls and improve ability to navigate throughout the community.    Time  7    Period  Weeks    Status  On-going      PT LONG TERM GOAL #3   Title  Pt will improve FGA to 19 to decrease risk for falls with yard work, working on cars, and vaEnvironmental health practitioner   Time  7    Period  Weeks    Status  On-going      PT LONG TERM GOAL #4   Title  Pt will perform SLS equally, bilaterally to improve gait pattern, stair negotiation, and reduce risk for falls with activity.    Time  7    Period  Weeks    Status  On-going            Plan - 06/19/19 1524    Clinical Impression Statement  Patient tolerated treatment well today. Patient did demo difficutly with foot clearance of RLE during hurdle ambulation using 12 inch hurlde. Patient required verbal cueing for improved ground clearance and also for avoiding LLE circumduction with stepping over hurdles. Patient did well with balance activity progressions. Patient showed min sway performing tandem stance on compliant surface. Will continue to challenge and progress balance activity and BLE strengthening as tolerated. Patient reported no increased pain, but did note increased fatigue in LLE post session.    Personal Factors and Comorbidities  Comorbidity 2    Comorbidities  AAA, COPD, asthma    Examination-Activity Limitations  Bed Mobility;Locomotion Level;Stairs;Transfers    Examination-Participation Restrictions  Driving;Yard Work    Stability/Clinical Decision Making  Stable/Uncomplicated    Rehab Potential  Good    PT Frequency  3x / week    PT Duration  --   7 weeks with 1 week vacation   PT Treatment/Interventions  ADLs/Self Care Home Management;Aquatic Therapy;Biofeedback;Electrical Stimulation;DME Instruction;Gait training;Stair training;Functional mobility training;Therapeutic activities;Therapeutic exercise;Balance training;Neuromuscular re-education;Patient/family education;Orthotic  Fit/Training;Manual techniques;Passive range of  motion;Dry needling;Taping;Joint Manipulations    PT Next Visit Plan  Continue to progress balance activity as tolerated. Add SLS next visit.    PT Home Exercise Plan  Eval: sidelying hip abduction, bridges, LAQ with 5 sec hold; 11/3: tandem stance, chair taps       Patient will benefit from skilled therapeutic intervention in order to improve the following deficits and impairments:  Abnormal gait, Decreased activity tolerance, Decreased balance, Decreased coordination, Decreased endurance, Decreased mobility, Decreased strength, Difficulty walking, Impaired perceived functional ability  Visit Diagnosis: Other symptoms and signs involving the nervous system  Muscle weakness (generalized)  Difficulty in walking, not elsewhere classified  Unsteadiness on feet     Problem List Patient Active Problem List   Diagnosis Date Noted  . Medication management 06/13/2019  . CVA (cerebral vascular accident) ([place]) 05/29/2019  . Former smoker 05/29/2019  . Aneurysm of thoracic aorta ([place]) 11/22/2017  . Hyperlipidemia 11/22/2017  . AAA (abdominal aortic aneurysm) without rupture ([place]) 11/22/2017  . Seasonal and perennial allergic rhinitis 04/19/2017  . Severe persistent asthma, uncomplicated 43/15/4008  . GERD (gastroesophageal reflux disease) 04/08/2016  . Chronic nonseasonal allergic rhinitis due to fungal spores 01/17/2014  . Asthma-COPD overlap syndrome ([place]) 01/17/2014  . Mild chronic obstructive pulmonary disease ([place]) 01/17/2014    3:29 PM, 06/19/19 Josue Hector PT DPT  Physical Therapist with [facility]  [phone] [place] [address] [place], [place], [address] Phone: [phone]   Fax:  [phone]  Name: Caleb Evans MRN: [id number] Date of Birth: 05/14/1951

## 2019-06-19 NOTE — Patient Instructions (Signed)
Strengthening: Chest Pull - Resisted   Hold Theraband in front of body with hands about shoulder width a part. Pull band a part and back together slowly. Repeat _10-12___ times. Complete __1__ set(s) per session.. Repeat _1___ session(s) per day.  http://orth.exer.us/926   Copyright  VHI. All rights reserved.   PNF Strengthening: Resisted   Standing with resistive band around each hand, bring right arm up and away, thumb back. Repeat _10-12___ times per set. Do __1__ sets per session. Do __1__ sessions per day.       Resisted External Rotation: in Neutral - Bilateral   Sit or stand, tubing in both hands, elbows at sides, bent to 90, forearms forward. Pinch shoulder blades together and rotate forearms out. Keep elbows at sides. Repeat _10-12___ times per set. Do __1__ sets per session. Do ___1_ sessions per day.  http://orth.exer.us/966   Copyright  VHI. All rights reserved.   PNF Strengthening: Resisted   Standing, hold resistive band above head. Bring right arm down and out from side. Repeat __10-12__ times per set. Do __1__ sets per session. Do __1__ sessions per day.  http://orth.exer.us/922   Copyright  VHI. All rights reserved.

## 2019-06-19 NOTE — Therapy (Signed)
[place], [place], [address] Phone: [phone]   Fax:  [phone]  Occupational Therapy Treatment  Patient Details  Name: Caleb Evans MRN: [id number] Date of Birth: 01/11/1951 Referring Provider (OT): Dr. Irwin Brakeman   Encounter Date: 06/19/2019  OT End of Session - 06/19/19 1526    Visit Number  3    Number of Visits  12    Date for OT Re-Evaluation  07/25/19   mini reassess on 07/04/19   Authorization Type  Medicare - progress note due on 10th visit    Authorization - Visit Number  3    Authorization - Number of Visits  10    OT Start Time  5110    OT Stop Time  1554    OT Time Calculation (min)  38 min    Activity Tolerance  Patient tolerated treatment well    Behavior During Therapy  [facility] for tasks assessed/performed       Past Medical History:  Diagnosis Date  . AAA (abdominal aortic aneurysm) ([place])   . Adrenal nodule ([place])   . Asthma   . Bladder mass   . Bladder neoplasm   . Cancer [facility])    bladder cancer  . COPD (chronic obstructive pulmonary disease) ([place])   . Crohn's disease ([place])   . Diverticulitis   . GERD (gastroesophageal reflux disease)    takes Nexium off and on  . Over weight   . Skin lesion   . Thoracic aortic aneurysm ([place])   . Weak urinary stream     Past Surgical History:  Procedure Laterality Date  . APPENDECTOMY    . CHOLECYSTECTOMY    . LAPAROSCOPIC PARTIAL COLECTOMY  01/18/2018   ERAS PATHWAY  . LAPAROSCOPIC PARTIAL COLECTOMY N/A 01/18/2018   Procedure: LAPAROSCOPIC PARTIAL COLECTOMY ERAS PATHWAY;  Surgeon: Stark Klein, MD;  Location: [place];  Service: General;  Laterality: N/A;  . TONSILLECTOMY      There were no vitals filed for this visit.  Subjective Assessment - 06/19/19 1522    Subjective   S: I have been using a 10lb weight at home because I don't have any 2# weights. I only do about 3 reps though.    Currently in Pain?  No/denies          [facility] OT Assessment - 06/19/19 1529      Assessment   Medical Diagnosis  CVA with left upper extremity weakness and lack of coordination      Precautions   Precautions  None               OT Treatments/Exercises (OP) - 06/19/19 1529      Exercises   Exercises  Shoulder;Hand      Shoulder Exercises: Seated   Horizontal ABduction  Theraband;10 reps    Theraband Level (Shoulder Horizontal ABduction)  Level 2 (Red)    External Rotation  Theraband;10 reps    Theraband Level (Shoulder External Rotation)  Level 2 (Red)    Internal Rotation  Theraband;10 reps    Theraband Level (Shoulder Internal Rotation)  Level 2 (Red)    Flexion  Theraband;10 reps    Theraband Level (Shoulder Flexion)  Level 2 (Red)    Abduction  Theraband;10 reps    Theraband Level (Shoulder ABduction)  Level 2 (Red)    Other Seated Exercises  tricep/bicep red theraband; 10X      Hand Exercises   Hand Gripper with Large Beads  all beads gripper at 50#    Hand Gripper with Medium Beads  all beads gripper at 50#   horizontal   Hand Gripper with Small Beads  all beads with gripper set at 42#   horizontal   Sponges  11,11   high resistance sponges   Other Hand Exercises  pt utilized gree resistive clothespin to pick up 20 sponges and place in container. completed 5 sponges with blue clothespin.              OT Education - 06/19/19 1528    Education Details  red theraband - shoulder strengthening HEP    Person(s) Educated  Patient    Methods  Explanation;Demonstration;Verbal cues    Comprehension  Returned demonstration;Verbalized understanding       OT Short Term Goals - 06/15/19 1115      OT SHORT TERM GOAL #1   Title  Patient will be educated on a HEP for improved functional use of his left upper extremity with functional tasks.    Time  3    Period  Weeks    Status  On-going    Target Date  07/04/19      OT SHORT TERM GOAL #2   Title  Paitent will improve LUE strength to  4-/5 or better for improved ability to lift pots and pans when cooking.    Time  3    Period  Weeks    Status  On-going      OT SHORT TERM GOAL #3   Title  Patient will increase LUE GMC from fair + to good - as evidence by increaesd accuracy with finger to nose test.    Time  3    Period  Weeks    Status  On-going      OT SHORT TERM GOAL #4   Title  Patient will increase left grip strength by 5# and pinch strength by 2# for improved ability to maintain grasp on household items.    Time  3    Period  Weeks    Status  On-going      OT SHORT TERM GOAL #5   Title  Patient will increase fine motor coordination as evidence by improve completion time on nine hole peg test by 10".    Time  3    Period  Weeks    Status  On-going        OT Long Term Goals - 06/15/19 1115      OT LONG TERM GOAL #1   Title  Patient will return to prior level of function with all B/IADLs and leisure activities using his left arm as dominant.    Time  6    Period  Weeks    Status  On-going      OT LONG TERM GOAL #2   Title  Patient will improve GMC to WNL in his left arm for improved safety and accuracy when completing daily tasks and riding his motorcycle.    Time  6    Period  Weeks    Status  On-going      OT LONG TERM GOAL #3   Title  Patient will improve left upper extremity strength to 5/5 for increased ability to lift items overhead when working on his motorcycle and completing house hold tasks.    Time  6    Period  Weeks    Status  On-going      OT LONG TERM GOAL #4  Title  Patient will improve left hand grip strength by 10# and pinch strength by 3# or more in his dominant left hand in order to open containers and maintain grasp on house hold items.    Time  6    Period  Weeks    Status  On-going      OT LONG TERM GOAL #5   Title  Patient will improve left hand fine motor coordination to WNL as evidence by completing the nine hole peg test in 24" or less for improve ability to fasten  buttons and manipulate change.    Time  6    Period  Weeks    Status  On-going            Plan - 06/19/19 1703    Clinical Impression Statement  A: Completed BUE strengthening with red theraband as patient reports he only has a 10lb hand weight at home. Patient was provided with a print out to complete for his HEP. Did not complete any in hand manipulation practice at home; pt reports no time as he focused on his strength.    Body Structure / Function / Physical Skills  ADL;Coordination;GMC;UE functional use;IADL;Dexterity;FMC;Proprioception;Strength    Plan  P: Add overhead lacing and therapy ball shoulder strengthening exercises.    Consulted and Agree with Plan of Care  Patient       Patient will benefit from skilled therapeutic intervention in order to improve the following deficits and impairments:   Body Structure / Function / Physical Skills: ADL, Coordination, GMC, UE functional use, IADL, Dexterity, FMC, Proprioception, Strength       Visit Diagnosis: Other lack of coordination  Other symptoms and signs involving the nervous system    Problem List Patient Active Problem List   Diagnosis Date Noted  . Medication management 06/13/2019  . CVA (cerebral vascular accident) ([place]) 05/29/2019  . Former smoker 05/29/2019  . Aneurysm of thoracic aorta ([place]) 11/22/2017  . Hyperlipidemia 11/22/2017  . AAA (abdominal aortic aneurysm) without rupture ([place]) 11/22/2017  . Seasonal and perennial allergic rhinitis 04/19/2017  . Severe persistent asthma, uncomplicated 96/06/6434  . GERD (gastroesophageal reflux disease) 04/08/2016  . Chronic nonseasonal allergic rhinitis due to fungal spores 01/17/2014  . Asthma-COPD overlap syndrome ([place]) 01/17/2014  . Mild chronic obstructive pulmonary disease ([place]) 01/17/2014   Ailene Ravel, OTR/L,CBIS  [phone]  06/19/2019, 5:05 PM  [facility] [address] [place], [place],  [address] Phone: [phone]   Fax:  [phone]  Name: KEISUKE HOLLABAUGH MRN: [id number] Date of Birth: [birth year]

## 2019-06-20 NOTE — Telephone Encounter (Signed)
Nucala Shipment Received: 120m #1 vial Medication arrival date: 06/20/2019 Lot #: [id number] Exp date: 06/2022 Received by: LDesmond Dike [place]

## 2019-06-21 ENCOUNTER — Encounter (HOSPITAL_COMMUNITY): Payer: Self-pay | Admitting: Physical Therapy

## 2019-06-21 ENCOUNTER — Other Ambulatory Visit: Payer: Self-pay

## 2019-06-21 ENCOUNTER — Ambulatory Visit (HOSPITAL_COMMUNITY): Payer: Medicare Other | Admitting: Physical Therapy

## 2019-06-21 DIAGNOSIS — R29818 Other symptoms and signs involving the nervous system: Secondary | ICD-10-CM

## 2019-06-21 DIAGNOSIS — M6281 Muscle weakness (generalized): Secondary | ICD-10-CM

## 2019-06-21 DIAGNOSIS — R2681 Unsteadiness on feet: Secondary | ICD-10-CM

## 2019-06-21 DIAGNOSIS — R262 Difficulty in walking, not elsewhere classified: Secondary | ICD-10-CM

## 2019-06-21 NOTE — Therapy (Signed)
[place], [place], [address] Phone: [phone]   Fax:  [phone]  Physical Therapy Treatment  Patient Details  Name: Caleb Evans MRN: [id number] Date of Birth: Dec 01, 1950 Referring Provider (PT): Irwin Brakeman, MD   Encounter Date: 06/21/2019  PT End of Session - 06/21/19 1521    Visit Number  7    Number of Visits  18    Date for PT Re-Evaluation  07/25/19    Authorization Type  Medicare; Secondary: Generic commercial    Authorization Time Period  06/06/19 to 07/28/19    Authorization - Visit Number  7    Authorization - Number of Visits  10    PT Start Time  1351    PT Stop Time  1430    PT Time Calculation (min)  39 min    Equipment Utilized During Treatment  Gait belt    Activity Tolerance  Patient tolerated treatment well    Behavior During Therapy  [facility] for tasks assessed/performed       Past Medical History:  Diagnosis Date  . AAA (abdominal aortic aneurysm) ([place])   . Adrenal nodule ([place])   . Asthma   . Bladder mass   . Bladder neoplasm   . Cancer [facility])    bladder cancer  . COPD (chronic obstructive pulmonary disease) ([place])   . Crohn's disease ([place])   . Diverticulitis   . GERD (gastroesophageal reflux disease)    takes Nexium off and on  . Over weight   . Skin lesion   . Thoracic aortic aneurysm ([place])   . Weak urinary stream     Past Surgical History:  Procedure Laterality Date  . APPENDECTOMY    . CHOLECYSTECTOMY    . LAPAROSCOPIC PARTIAL COLECTOMY  01/18/2018   ERAS PATHWAY  . LAPAROSCOPIC PARTIAL COLECTOMY N/A 01/18/2018   Procedure: LAPAROSCOPIC PARTIAL COLECTOMY ERAS PATHWAY;  Surgeon: Stark Klein, MD;  Location: [place];  Service: General;  Laterality: N/A;  . TONSILLECTOMY      There were no vitals filed for this visit.  Subjective Assessment - 06/21/19 1520    Subjective  Patient reported feeling good without any pain.    Currently in Pain?  No/denies                        [facility] Adult PT Treatment/Exercise - 06/21/19 0001      Knee/Hip Exercises: Standing   Forward Step Up  Left;3 sets;10 reps;Step Height: 8"    Forward Step Up Limitations  opposite knee drive 5'' holds    Functional Squat  15 reps    Functional Squat Limitations  tapping onto standard chair          Balance Exercises - 06/21/19 1540      Balance Exercises: Standing   Tandem Stance  Eyes open;4 reps;30 secs    SLS with Vectors  Solid surface;5 reps   3'' forward, side, back    Rockerboard  Lateral   Maintaining in middle x 2 minutes   Balance Beam  tandem gait foam beam 6x16 feet stepping over (4) 6'' hurdle    Other Standing Exercises  Ambulation with head turns 6 x 15 feet           PT Short Term Goals - 06/13/19 1356      PT SHORT TERM GOAL #1   Title  Pt will consistently perform HEP at least 3x/week to improve  strength, gait, and ability to perform recreational activities.    Time  3    Period  Weeks    Status  On-going    Target Date  06/27/19      PT SHORT TERM GOAL #2   Title  Pt will improve 5x STS test to 15 sec, no UE assist, no AD, and equal weightbearing throughout BLE to demo improved functional strength when rising from seated surfaces.    Time  3    Period  Weeks    Status  On-going      PT SHORT TERM GOAL #3   Title  Pt will ambulate 0.50ms with LRAD to reduce risk for falls and improve ability to navigate limited community distances.    Time  3    Period  Weeks    Status  On-going        PT Long Term Goals - 06/13/19 1356      PT LONG TERM GOAL #1   Title  Pt will demonstrate LLE 4/5 strength to reduce risk for falls, improve gait, improve transfers, and allow pt to return to normal recreational activities.    Time  7    Period  Weeks    Status  On-going      PT LONG TERM GOAL #2   Title  Pt will ambulate 1.267m with LRAD to reduce risk for falls and improve ability to navigate throughout the  community.    Time  7    Period  Weeks    Status  On-going      PT LONG TERM GOAL #3   Title  Pt will improve FGA to 19 to decrease risk for falls with yard work, working on cars, and vaEnvironmental health practitioner   Time  7    Period  Weeks    Status  On-going      PT LONG TERM GOAL #4   Title  Pt will perform SLS equally, bilaterally to improve gait pattern, stair negotiation, and reduce risk for falls with activity.    Time  7    Period  Weeks    Status  On-going            Plan - 06/21/19 1552    Clinical Impression Statement  Continued focus on balance and functional strengthening this session. This session progressed to tandem ambulation on foam beam stepping over 6-inch hurdles. Also added single limb stance with vectors this session and included rockerboard attempting to maintain the board in the middle without the sides touching for bout of 2 minutes with patient frequently having the left side of the board touch down. Patient would continue to benefit from higher level.    Personal Factors and Comorbidities  Comorbidity 2    Comorbidities  AAA, COPD, asthma    Examination-Activity Limitations  Bed Mobility;Locomotion Level;Stairs;Transfers    Examination-Participation Restrictions  Driving;Yard Work    Stability/Clinical Decision Making  Stable/Uncomplicated    Rehab Potential  Good    PT Frequency  3x / week    PT Duration  --   7 weeks with 1 week vacation   PT Treatment/Interventions  ADLs/Self Care Home Management;Aquatic Therapy;Biofeedback;Electrical Stimulation;DME Instruction;Gait training;Stair training;Functional mobility training;Therapeutic activities;Therapeutic exercise;Balance training;Neuromuscular re-education;Patient/family education;Orthotic Fit/Training;Manual techniques;Passive range of motion;Dry needling;Taping;Joint Manipulations    PT Next Visit Plan  Continue to progress balance activity as tolerated. Trial lateral lunge with cone placement/reaching.    PT Home  Exercise Plan  Eval: sidelying hip abduction, bridges, LAQ  with 5 sec hold; 11/3: tandem stance, chair taps    Consulted and Agree with Plan of Care  Patient       Patient will benefit from skilled therapeutic intervention in order to improve the following deficits and impairments:  Abnormal gait, Decreased activity tolerance, Decreased balance, Decreased coordination, Decreased endurance, Decreased mobility, Decreased strength, Difficulty walking, Impaired perceived functional ability  Visit Diagnosis: Other symptoms and signs involving the nervous system  Unsteadiness on feet  Muscle weakness (generalized)  Difficulty in walking, not elsewhere classified     Problem List Patient Active Problem List   Diagnosis Date Noted  . Medication management 06/13/2019  . CVA (cerebral vascular accident) ([place]) 05/29/2019  . Former smoker 05/29/2019  . Aneurysm of thoracic aorta ([place]) 11/22/2017  . Hyperlipidemia 11/22/2017  . AAA (abdominal aortic aneurysm) without rupture ([place]) 11/22/2017  . Seasonal and perennial allergic rhinitis 04/19/2017  . Severe persistent asthma, uncomplicated 96/11/5407  . GERD (gastroesophageal reflux disease) 04/08/2016  . Chronic nonseasonal allergic rhinitis due to fungal spores 01/17/2014  . Asthma-COPD overlap syndrome ([place]) 01/17/2014  . Mild chronic obstructive pulmonary disease ([place]) 01/17/2014   Clarene Critchley PT, DPT 3:58 PM, 06/21/19 [place], [place], [address] Phone: [phone]   Fax:  [phone]  Name: Caleb Evans MRN: [id number] Date of Birth: [birth year]

## 2019-06-23 ENCOUNTER — Ambulatory Visit (HOSPITAL_COMMUNITY): Payer: Medicare Other

## 2019-06-23 ENCOUNTER — Encounter (HOSPITAL_COMMUNITY): Payer: Self-pay

## 2019-06-23 ENCOUNTER — Other Ambulatory Visit: Payer: Self-pay

## 2019-06-23 ENCOUNTER — Telehealth (HOSPITAL_COMMUNITY): Payer: Self-pay | Admitting: Occupational Therapy

## 2019-06-23 DIAGNOSIS — M6281 Muscle weakness (generalized): Secondary | ICD-10-CM

## 2019-06-23 DIAGNOSIS — R262 Difficulty in walking, not elsewhere classified: Secondary | ICD-10-CM

## 2019-06-23 DIAGNOSIS — R29818 Other symptoms and signs involving the nervous system: Secondary | ICD-10-CM

## 2019-06-23 DIAGNOSIS — R2681 Unsteadiness on feet: Secondary | ICD-10-CM

## 2019-06-23 NOTE — Therapy (Signed)
[place], [place], [address] Phone: [phone]   Fax:  [phone]  Physical Therapy Treatment  Patient Details  Name: Caleb Evans MRN: [id number] Date of Birth: 1950/09/21 Referring Provider (PT): Irwin Brakeman, MD   Encounter Date: 06/23/2019  PT End of Session - 06/23/19 1319    Visit Number  8    Number of Visits  18    Date for PT Re-Evaluation  07/25/19    Authorization Type  Medicare; Secondary: Generic commercial    Authorization - Visit Number  8    Authorization - Number of Visits  10    PT Start Time  2224    PT Stop Time  1355    PT Time Calculation (min)  40 min    Activity Tolerance  Patient tolerated treatment well    Behavior During Therapy  [facility] for tasks assessed/performed       Past Medical History:  Diagnosis Date  . AAA (abdominal aortic aneurysm) ([place])   . Adrenal nodule ([place])   . Asthma   . Bladder mass   . Bladder neoplasm   . Cancer [facility])    bladder cancer  . COPD (chronic obstructive pulmonary disease) ([place])   . Crohn's disease ([place])   . Diverticulitis   . GERD (gastroesophageal reflux disease)    takes Nexium off and on  . Over weight   . Skin lesion   . Thoracic aortic aneurysm ([place])   . Weak urinary stream     Past Surgical History:  Procedure Laterality Date  . APPENDECTOMY    . CHOLECYSTECTOMY    . LAPAROSCOPIC PARTIAL COLECTOMY  01/18/2018   ERAS PATHWAY  . LAPAROSCOPIC PARTIAL COLECTOMY N/A 01/18/2018   Procedure: LAPAROSCOPIC PARTIAL COLECTOMY ERAS PATHWAY;  Surgeon: Stark Klein, MD;  Location: [facility];  Service: General;  Laterality: N/A;  . TONSILLECTOMY      There were no vitals filed for this visit.  Subjective Assessment - 06/23/19 1311    Subjective  Pt stated he feels ready for discharge, has met all his personal goals.  Able to go shopping for 3 hours with wife without difficults.    Patient Stated Goals  to walk again    Currently in  Pain?  No/denies         [facility] PT Assessment - 06/23/19 0001      Assessment   Medical Diagnosis  CVA with left upper extremity weakness and lack of coordination    Referring Provider (PT)  Irwin Brakeman, MD    Onset Date/Surgical Date  05/29/19    Hand Dominance  Right    Next MD Visit  Unscheduled with Wynetta Emery; heart MD next week    Prior Therapy  Acute Care PT, OP PT current       Precautions   Precautions  None      Observation/Other Assessments   Focus on Therapeutic Outcomes (FOTO)   3% limited   was 36% limited     Functional Tests   Functional tests  Sit to Stand      Sit to Stand   Comments  14.42" no HHA   was 5x STS: 20 sec, from chair, no UE assist, weightshift to     Strength   Strength Assessment Site  Hip;Knee;Ankle    Right Hip Flexion  5/5   was 4+   Right Hip Extension  4/5   was 4   Right Hip ABduction  5/5   was 4+   Left Hip Flexion  4+/5   was 3/5   Left Hip Extension  3+/5   was 3-/5   Left Hip ABduction  4/5   was 3/5   Right Knee Flexion  5/5    Right Knee Extension  5/5    Left Knee Flexion  4+/5   was 3+   Left Knee Extension  4+/5   was 3/5   Right Ankle Dorsiflexion  5/5    Right Ankle Plantar Flexion  4+/5   was 4/5   Left Ankle Dorsiflexion  4+/5   was 4/5   Left Ankle Plantar Flexion  4+/5   was 2+     Ambulation/Gait   Ambulation/Gait  Yes    Ambulation/Gait Assistance  4: Min guard;6: Modified independent (Device/Increase time)    Ambulation Distance (Feet)  604 Feet   3MWT   Assistive device  None    Gait Pattern  Within Functional Limits    Gait velocity  1.02 m/s   was 0.63ms   Gait Comments  3MWT      Static Standing Balance   Static Standing - Balance Support  No upper extremity supported    Static Standing Balance -  Activities   Single Leg Stance - Right Leg;Single Leg Stance - Left Leg    Static Standing - Comment/# of Minutes  Rt 23", Lt 22"      Functional Gait  Assessment   Gait Level Surface   Walks 20 ft in less than 5.5 sec, no assistive devices, good speed, no evidence for imbalance, normal gait pattern, deviates no more than 6 in outside of the 12 in walkway width.    Change in Gait Speed  Able to smoothly change walking speed without loss of balance or gait deviation. Deviate no more than 6 in outside of the 12 in walkway width.    Gait with Horizontal Head Turns  Performs head turns smoothly with no change in gait. Deviates no more than 6 in outside 12 in walkway width    Gait with Vertical Head Turns  Performs head turns with no change in gait. Deviates no more than 6 in outside 12 in walkway width.    Gait and Pivot Turn  Pivot turns safely within 3 sec and stops quickly with no loss of balance.    Step Over Obstacle  Is able to step over 2 stacked shoe boxes taped together (9 in total height) without changing gait speed. No evidence of imbalance.    Gait with Narrow Base of Support  Is able to ambulate for 10 steps heel to toe with no staggering.    Gait with Eyes Closed  Walks 20 ft, no assistive devices, good speed, no evidence of imbalance, normal gait pattern, deviates no more than 6 in outside 12 in walkway width. Ambulates 20 ft in less than 7 sec.    Ambulating Backwards  Walks 20 ft, no assistive devices, good speed, no evidence for imbalance, normal gait    Steps  Alternating feet, no rail.    Total Score  30                             PT Short Term Goals - 06/23/19 1319      PT SHORT TERM GOAL #1   Title  Pt will consistently perform HEP at least 3x/week to improve strength, gait, and ability to  perform recreational activities.    Baseline  06/23/19: Reports compliance with HEP activities daily.    Status  Achieved      PT SHORT TERM GOAL #2   Title  Pt will improve 5x STS test to 15 sec, no UE assist, no AD, and equal weightbearing throughout BLE to demo improved functional strength when rising from seated surfaces.    Baseline  06/23/19: 5  ION62.95"    Status  Achieved      PT SHORT TERM GOAL #3   Title  Pt will ambulate 0.96ms with LRAD to reduce risk for falls and improve ability to navigate limited community distances.    Baseline  06/23/19: 3MWT [id number] AD = 1.02 m/s    Status  Achieved        PT Long Term Goals - 06/23/19 1330      PT LONG TERM GOAL #1   Title  Pt will demonstrate LLE 4/5 strength to reduce risk for falls, improve gait, improve transfers, and allow pt to return to normal recreational activities.    Baseline  06/21/19:  see MMT, all 4/5 or better except for Lt hip extension    Status  Partially Met      PT LONG TERM GOAL #2   Title  Pt will ambulate 1.27m44mwith LRAD to reduce risk for falls and improve ability to navigate throughout the community.    Baseline  06/23/19: 3MWT [id number] AD = 1.02 m/s    Status  On-going      PT LONG TERM GOAL #3   Title  Pt will improve FGA to 19 to decrease risk for falls with yard work, working on cars, and vacaEnvironmental health practitioner Baseline  06/23/19: FGA 30    Status  Achieved      PT LONG TERM GOAL #4   Title  Pt will perform SLS equally, bilaterally to improve gait pattern, stair negotiation, and reduce risk for falls with activity.    Baseline  06/23/19; Rt 22", Lt 23"    Status  Achieved            Plan - 06/23/19 1540    Clinical Impression Statement  Pt arrived to session stating he feels ready for discharge.  Reports he has been shopping with wife for 3 hours with no AD and felt great.  Reviewed goals with the following findings: all STGs met and 2/4 LTGs met.  Pt presents wiht improved strength, FGA at 30/30, increased speed with 3MWT and able to complete 5 STS in 14.42", FOTO improvements wiht 3% limitations.  Pt continues to demonstrate weakness Lt hip extension and gait speed at 1.02 m/s.  Reviewed HEP and encouraged pt to continue following therapy with verbalized understanding.    Personal Factors and Comorbidities  Comorbidity 2    Comorbidities   AAA, COPD, asthma    Examination-Activity Limitations  Bed Mobility;Locomotion Level;Stairs;Transfers    Examination-Participation Restrictions  Driving;Yard Work    StabMerchant navy officerable/Uncomplicated    ClinDesigner, jewelleryw    Rehab Potential  Good    PT Frequency  3x / week    PT Duration  --   7 weeks with 1 week vacation   PT Treatment/Interventions  ADLs/Self Care Home Management;Aquatic Therapy;Biofeedback;Electrical Stimulation;DME Instruction;Gait training;Stair training;Functional mobility training;Therapeutic activities;Therapeutic exercise;Balance training;Neuromuscular re-education;Patient/family education;Orthotic Fit/Training;Manual techniques;Passive range of motion;Dry needling;Taping;Joint Manipulations    PT Next Visit Plan  DC to HEP.    PT Home  Exercise Plan  Eval: sidelying hip abduction, bridges, LAQ with 5 sec hold; 11/3: tandem stance, chair taps       Patient will benefit from skilled therapeutic intervention in order to improve the following deficits and impairments:  Abnormal gait, Decreased activity tolerance, Decreased balance, Decreased coordination, Decreased endurance, Decreased mobility, Decreased strength, Difficulty walking, Impaired perceived functional ability  Visit Diagnosis: Unsteadiness on feet  Muscle weakness (generalized)  Difficulty in walking, not elsewhere classified  Other symptoms and signs involving the nervous system     Problem List Patient Active Problem List   Diagnosis Date Noted  . Medication management 06/13/2019  . CVA (cerebral vascular accident) ([place]) 05/29/2019  . Former smoker 05/29/2019  . Aneurysm of thoracic aorta ([place]) 11/22/2017  . Hyperlipidemia 11/22/2017  . AAA (abdominal aortic aneurysm) without rupture ([place]) 11/22/2017  . Seasonal and perennial allergic rhinitis 04/19/2017  . Severe persistent asthma, uncomplicated 41/74/0814  . GERD (gastroesophageal reflux disease)  04/08/2016  . Chronic nonseasonal allergic rhinitis due to fungal spores 01/17/2014  . Asthma-COPD overlap syndrome ([place]) 01/17/2014  . Mild chronic obstructive pulmonary disease ([place]) 01/17/2014   Ihor Austin, LPTA; [place]  Aldona Lento 06/23/2019, 3:49 PM  [facility] [address] [place], [place], [address] Phone: [phone]   Fax:  [phone]  Name: Caleb Evans MRN: [id number] Date of Birth: [birth year]

## 2019-06-23 NOTE — Telephone Encounter (Signed)
pt feels like he is doing better wants to be released i guess

## 2019-06-25 IMAGING — DX DG CHEST 2V
2 series · 2 of 2 positions shown · non-contrast
Comparison: CT, 10/14/2017

CLINICAL DATA: Short of breath and cough for 3 days.

EXAM:
CHEST - 2 VIEW

[chest pa]
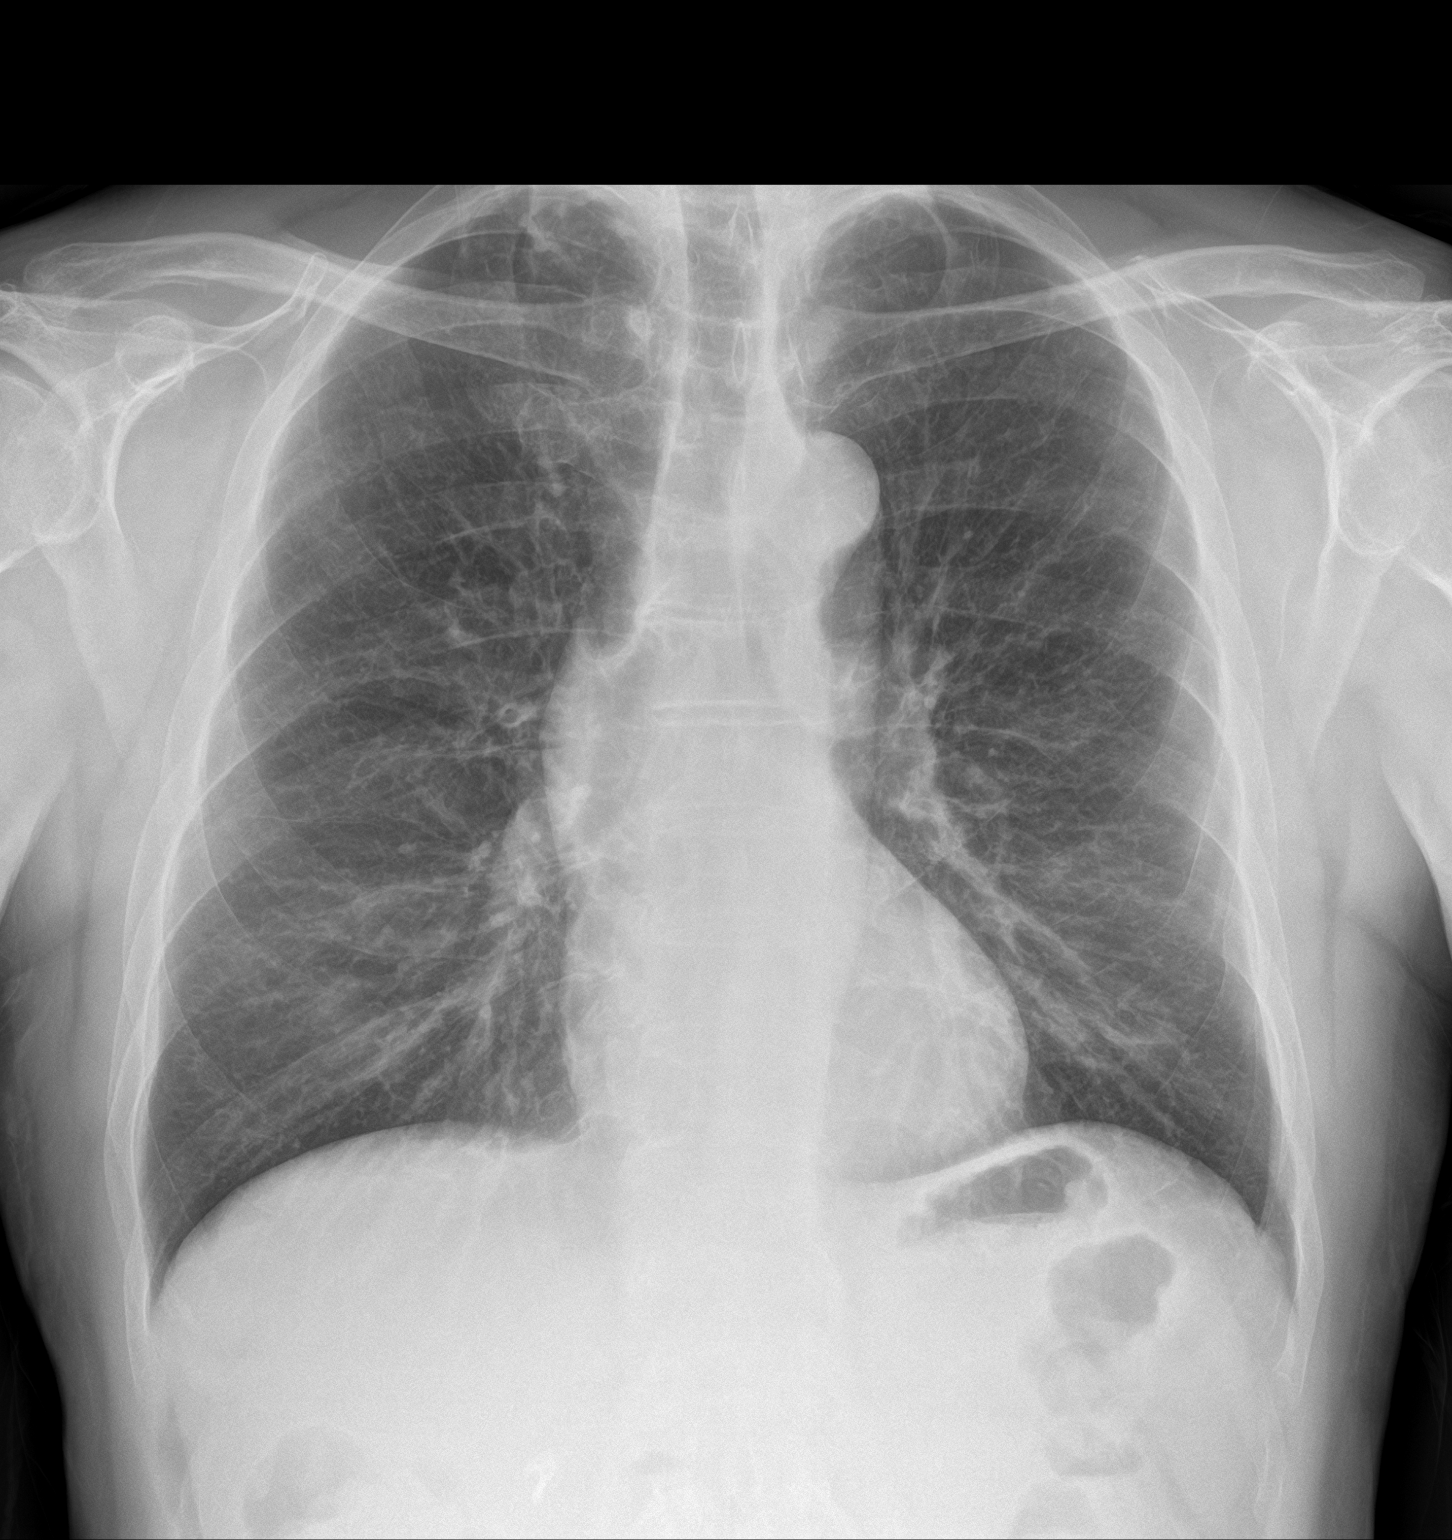

[chest lat]
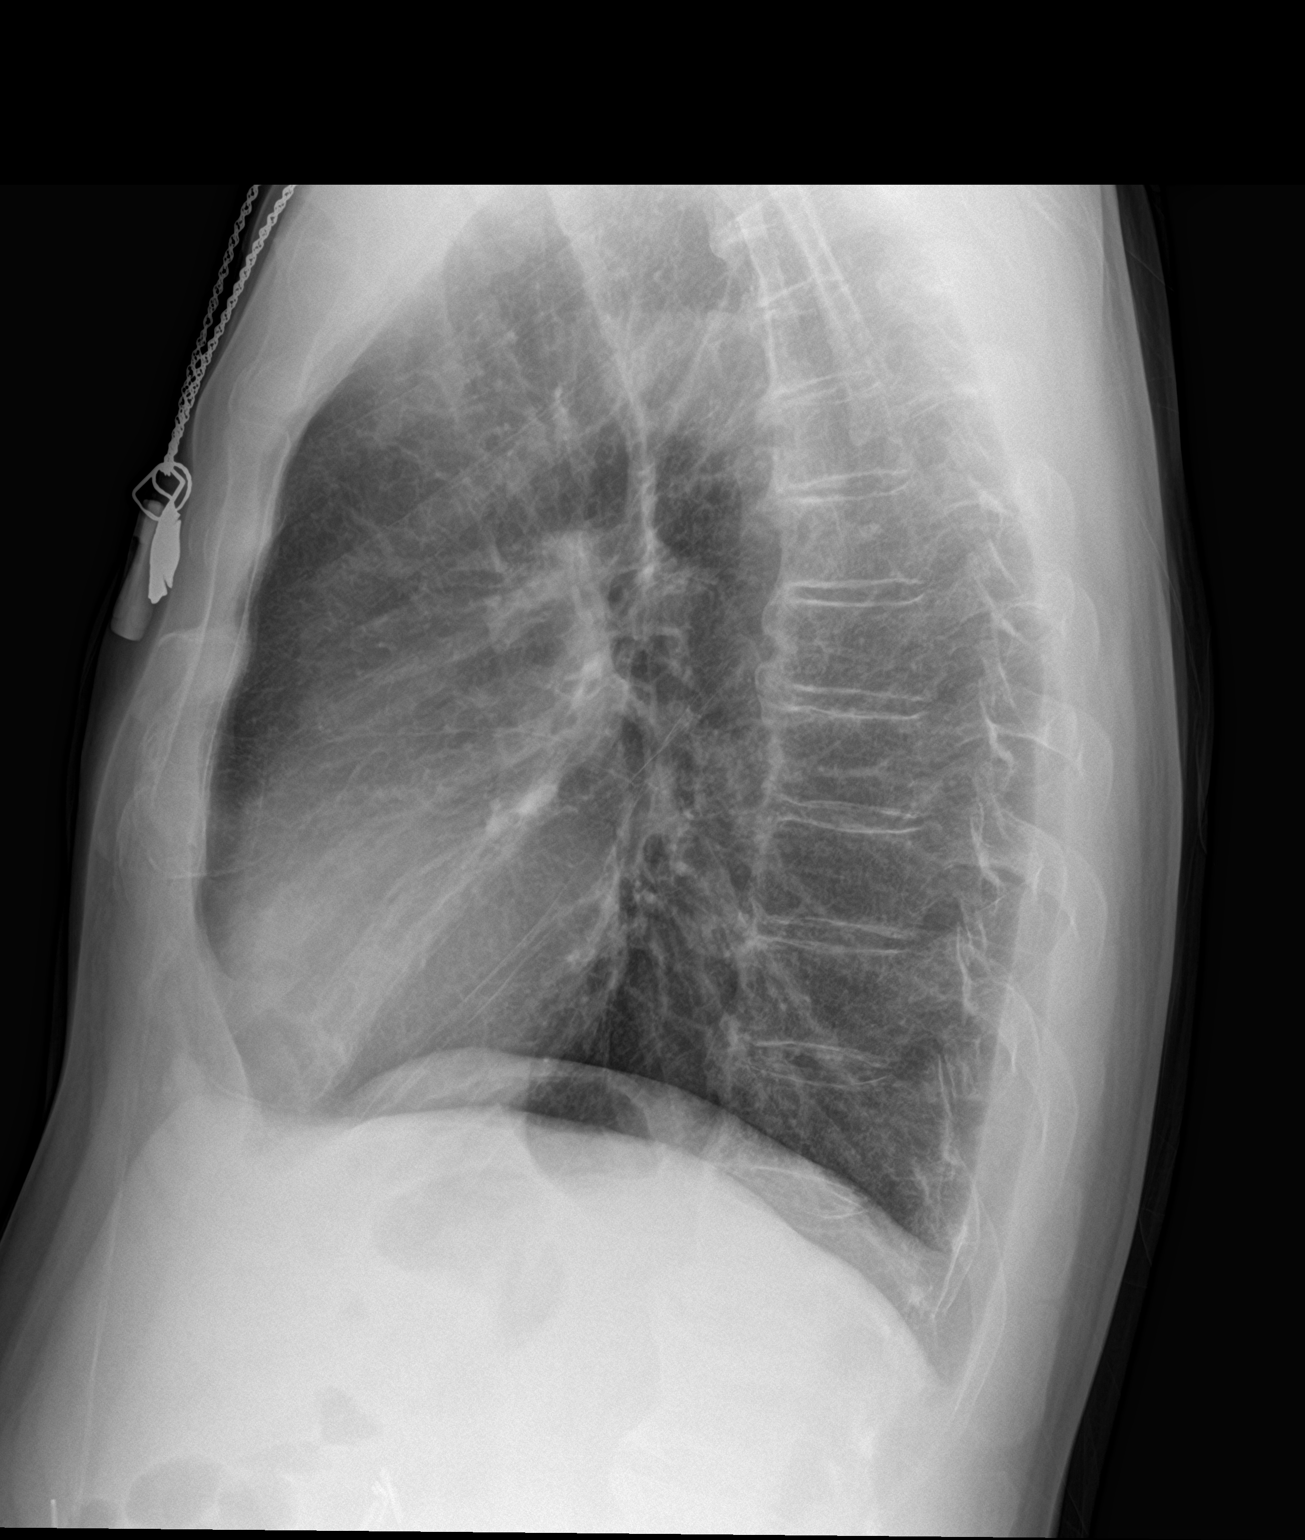

[2 of 2 positions shown; findings below may reference images not displayed]

FINDINGS: Cardiac silhouette is normal in size. No mediastinal or hilar
masses. No evidence of adenopathy.

Stable scarring at the right apex. Lungs are hyperexpanded but
otherwise clear. No pleural effusion or pneumothorax.

Skeletal structures are intact.
IMPRESSION: No active cardiopulmonary disease.

## 2019-06-26 ENCOUNTER — Other Ambulatory Visit: Payer: Self-pay

## 2019-06-26 ENCOUNTER — Ambulatory Visit (HOSPITAL_COMMUNITY): Payer: PRIVATE HEALTH INSURANCE | Admitting: Physical Therapy

## 2019-06-26 ENCOUNTER — Ambulatory Visit (INDEPENDENT_AMBULATORY_CARE_PROVIDER_SITE_OTHER): Payer: Medicare Other

## 2019-06-26 DIAGNOSIS — J449 Chronic obstructive pulmonary disease, unspecified: Secondary | ICD-10-CM | POA: Diagnosis not present

## 2019-06-26 MED ORDER — MEPOLIZUMAB 100 MG ~~LOC~~ SOLR
100.0000 mg | Freq: Once | SUBCUTANEOUS | Status: AC
  Administered 2019-06-26: 100 mg via SUBCUTANEOUS

## 2019-06-26 NOTE — Progress Notes (Signed)
Have you been hospitalized within the last 10 days?  No Do you have a fever?  No Do you have a cough?  No Do you have a headache or sore throat? No  

## 2019-06-27 ENCOUNTER — Ambulatory Visit (INDEPENDENT_AMBULATORY_CARE_PROVIDER_SITE_OTHER): Payer: Medicare Other | Admitting: Internal Medicine

## 2019-06-27 ENCOUNTER — Encounter: Payer: Self-pay | Admitting: Internal Medicine

## 2019-06-27 VITALS — BP 122/78 | HR 67 | Temp 97.6°F | Ht 67.0 in | Wt 184.0 lb

## 2019-06-27 DIAGNOSIS — I639 Cerebral infarction, unspecified: Secondary | ICD-10-CM | POA: Diagnosis not present

## 2019-06-27 NOTE — Progress Notes (Signed)
HPI Caleb Evans is referred today by Dr. 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 He is a pleasant 68 yo man who was admitted several weeks ago with a stroke involving the right brain. He has improved except for some reduced coordination of his left arm and leg. He does not have palpitations and has never been diagnosed with arrhythmia or atrial fib. No syncope.  Allergies  Allergen Reactions  . Chlorpheniramine Other (See Comments)    Sinus congestion  . Amoxicillin-Pot Clavulanate Other (See Comments)    UNSPECIFIED REACTION  Has patient had a PCN reaction causing immediate rash, facial/tongue/throat swelling, SOB or lightheadedness with hypotension: No Has patient had a PCN reaction causing severe rash involving mucus membranes or skin necrosis: No Has patient had a PCN reaction that required hospitalization: No Has patient had a PCN reaction occurring within the last 10 years: Yes If all of the above answers are "NO", then may proceed with Cephalosporin use.  Caused liver failure   . Lisinopril Cough     Current Outpatient Medications  Medication Sig Dispense Refill  . aspirin 81 MG chewable tablet Chew 1 tablet (81 mg total) by mouth daily.    Marland Kitchen atorvastatin (LIPITOR) 80 MG tablet Take 80 mg by mouth at bedtime.    . budesonide-formoterol (SYMBICORT) 160-4.5 MCG/ACT inhaler Inhale 2 puffs into the lungs 2 (two) times daily.    . Calcium Carb-Cholecalciferol (CALCIUM 600+D3 PO) Take 1 tablet by mouth daily.    . Fluticasone Propionate (XHANCE) 93 MCG/ACT EXHU Place 2 puffs into the nose 2 (two) times daily. 32 mL 5  . ipratropium (ATROVENT) 0.06 % nasal spray Place 2 sprays into both nostrils 4 (four) times daily. 15 mL 12  . ipratropium-albuterol (DUONEB) 0.5-2.5 (3) MG/3ML SOLN Take 3 mLs by nebulization every 4 (four) hours as needed.    Marland Kitchen losartan (COZAAR) 25 MG tablet Take 25 mg by mouth daily.    . Mepolizumab (NUCALA) 100 MG/ML SOAJ Inject into the skin.    .  Multiple Vitamin (MULTIVITAMIN WITH MINERALS) TABS tablet Take 1 tablet by mouth daily.    Marland Kitchen PROAIR HFA 108 (90 Base) MCG/ACT inhaler INHALE 2 PUFFS EVERY 4 TO 6 HOURS AS NEEDED. 8.5 g 1   Current Facility-Administered Medications  Medication Dose Route Frequency Provider Last Rate Last Dose  . Mepolizumab SOLR 100 mg  100 mg Subcutaneous Q28 days Mannam, Praveen, MD   100 mg at 06/21/18 0809  . Mepolizumab SOLR 100 mg  100 mg Subcutaneous Q28 days Mannam, Praveen, MD   100 mg at 09/23/18 1621  . Mepolizumab SOLR 100 mg  100 mg Subcutaneous Q28 days Mannam, Praveen, MD   100 mg at 12/26/18 1411  . Mepolizumab SOLR 100 mg  100 mg Subcutaneous Q28 days Mannam, Praveen, MD   100 mg at 01/23/19 1405  . Mepolizumab SOLR 100 mg  100 mg Subcutaneous Q28 days Mannam, Praveen, MD   100 mg at 02/23/19 1347  . Mepolizumab SOLR 100 mg  100 mg Subcutaneous Q28 days Mannam, Praveen, MD   100 mg at 03/27/19 1359  . Mepolizumab SOLR 100 mg  100 mg Subcutaneous Q28 days Mannam, Praveen, MD   100 mg at 04/27/19 1413   Facility-Administered Medications Ordered in Other Visits  Medication Dose Route Frequency Provider Last Rate Last Dose  . clindamycin (CLEOCIN) 900 mg in dextrose 5 % 50 mL IVPB  900 mg Intravenous 60 min Pre-Op Stark Klein, MD  And  . gentamicin (GARAMYCIN) 390 mg in dextrose 5 % 50 mL IVPB  5 mg/kg Intravenous 60 min Pre-Op Stark Klein, MD         Past Medical History:  Diagnosis Date  . AAA (abdominal aortic aneurysm) ([place])   . Adrenal nodule ([place])   . Asthma   . Bladder mass   . Bladder neoplasm   . Cancer [facility])    bladder cancer  . COPD (chronic obstructive pulmonary disease) ([place])   . Crohn's disease ([place])   . Diverticulitis   . GERD (gastroesophageal reflux disease)    takes Nexium off and on  . Over weight   . Skin lesion   . Thoracic aortic aneurysm ([place])   . Weak urinary stream     ROS:   All systems reviewed and negative except as noted in the HPI.    Past Surgical History:  Procedure Laterality Date  . APPENDECTOMY    . CHOLECYSTECTOMY    . LAPAROSCOPIC PARTIAL COLECTOMY  01/18/2018   ERAS PATHWAY  . LAPAROSCOPIC PARTIAL COLECTOMY N/A 01/18/2018   Procedure: LAPAROSCOPIC PARTIAL COLECTOMY ERAS PATHWAY;  Surgeon: Stark Klein, MD;  Location: [facility];  Service: General;  Laterality: N/A;  . TONSILLECTOMY       Family History  Adopted: Yes  Problem Relation Age of Onset  . Healthy Son   . Healthy Daughter   . Bladder Cancer Neg Hx   . Kidney cancer Neg Hx   . Prostate cancer Neg Hx   . Allergic rhinitis Neg Hx   . Angioedema Neg Hx   . Asthma Neg Hx   . Eczema Neg Hx   . Urticaria Neg Hx   . Immunodeficiency Neg Hx      Social History   Socioeconomic History  . Marital status: Married    Spouse name: Not on file  . Number of children: 2  . Years of education: Not on file  . Highest education level: Associate degree: occupational, Hotel manager, or vocational program  Occupational History  . Not on file  Social Needs  . Financial resource strain: Not on file  . Food insecurity    Worry: Not on file    Inability: Not on file  . Transportation needs    Medical: Not on file    Non-medical: Not on file  Tobacco Use  . Smoking status: Former Smoker    Packs/day: 3.00    Years: 50.00    Pack years: 150.00    Types: Cigarettes    Quit date: 04/10/2018    Years since quitting: 1.2  . Smokeless tobacco: Never Used  Substance and Sexual Activity  . Alcohol use: Yes    Alcohol/week: 0.0 standard drinks    Comment: occasional  . Drug use: No  . Sexual activity: Not on file  Lifestyle  . Physical activity    Days per week: Not on file    Minutes per session: Not on file  . Stress: Not on file  Relationships  . Social Herbalist on phone: Not on file    Gets together: Not on file    Attends religious service: Not on file    Active member of club or organization: Not on file    Attends meetings of clubs or  organizations: Not on file    Relationship status: Not on file  . Intimate partner violence    Fear of current or ex partner: Not on file    Emotionally  abused: Not on file    Physically abused: Not on file    Forced sexual activity: Not on file  Other Topics Concern  . Not on file  Social History Narrative   Patient is adopted   Married, lives with spouse   2 children (boy and girl)   OCCUPATION: Dealer x37yr.  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   Right handed   Pt drinks 2 cups of coffee a day, rarely drinks tea, he drinks soda 3-4 a day     BP 122/78   Pulse 67   Temp 97.6 F (36.4 C)   Ht 5' 7"  (1.702 m)   Wt 184 lb (83.5 kg)   SpO2 95%   BMI 28.82 kg/m   Physical Exam:  Well appearing NAD HEENT: Unremarkable Neck:  No JVD, no thyromegally Lymphatics:  No adenopathy Back:  No CVA tenderness Lungs:  Clear with no wheezes HEART:  Regular rate rhythm, no murmurs, no rubs, no clicks Abd:  soft, positive bowel sounds, no organomegally, no rebound, no guarding Ext:  2 plus pulses, no edema, no cyanosis, no clubbing Skin:  No rashes no nodules Neuro:  CN II through XII intact, motor grossly intact  Assess/Plan: 1. Cryptogenic stroke - I have discussed the treatment options with the patient and his wife. I have recommended insertion of an ILR.  He will let uKoreaknow if he wishes to proceed with this.  2. HTN - his bp is controlled today. We will follow.  GMikle BosworthD.

## 2019-06-27 NOTE — Patient Instructions (Signed)
Medication Instructions:  Your physician recommends that you continue on your current medications as directed. Please refer to the Current Medication list given to you today.  *If you need a refill on your cardiac medications before your next appointment, please call your pharmacy*  Lab Work: NONE  If you have labs (blood work) drawn today and your tests are completely normal, you will receive your results only by: Marland Kitchen MyChart Message (if you have MyChart) OR . A paper copy in the mail If you have any lab test that is abnormal or we need to change your treatment, we will call you to review the results.  Testing/Procedures: I will call with dates regarding loop recorder dates.   Follow-Up: At [facility], you and your health needs are our priority.  As part of our continuing mission to provide you with exceptional heart care, we have created designated Provider Care Teams.  These Care Teams include your primary Cardiologist (physician) and Advanced Practice Providers (APPs -  Physician Assistants and Nurse Practitioners) who all work together to provide you with the care you need, when you need it.  Your next appointment:    Pending   The format for your next appointment:   In Person  Provider:   Cristopher Peru, MD  Other Instructions Thank you for choosing [facility]!

## 2019-06-28 ENCOUNTER — Ambulatory Visit (HOSPITAL_COMMUNITY): Payer: Medicare Other | Admitting: Occupational Therapy

## 2019-06-28 ENCOUNTER — Encounter (HOSPITAL_COMMUNITY): Payer: Self-pay | Admitting: Occupational Therapy

## 2019-06-28 ENCOUNTER — Ambulatory Visit (INDEPENDENT_AMBULATORY_CARE_PROVIDER_SITE_OTHER): Payer: Medicare Other

## 2019-06-28 ENCOUNTER — Telehealth (HOSPITAL_COMMUNITY): Payer: Self-pay

## 2019-06-28 ENCOUNTER — Ambulatory Visit (HOSPITAL_COMMUNITY): Payer: PRIVATE HEALTH INSURANCE | Admitting: Physical Therapy

## 2019-06-28 DIAGNOSIS — J309 Allergic rhinitis, unspecified: Secondary | ICD-10-CM | POA: Diagnosis not present

## 2019-06-28 NOTE — Telephone Encounter (Signed)
He is doing well and wants to be D/c from PT and OT today

## 2019-06-28 NOTE — Therapy (Signed)
[place], [place], [address] Phone: [phone]   Fax:  [phone]  Patient Details  Name: Caleb Evans MRN: [id number] Date of Birth: [birth year] Referring Provider:  No ref. provider found  Encounter Date: 06/28/2019  OCCUPATIONAL THERAPY DISCHARGE SUMMARY  Visits from Start of Care: 3  Current functional level related to goals / functional outcomes: Pt came in office for PT appt, however due to scheduling issues is unable to attend or reschedule OT appts in the next week. Pt reports he is doing great at home and is using his arm and hand during ADLs without difficulty. He feels like he is ready to discharge at continue with his HEP at this time.    Remaining deficits: Fatigue, occasional coordination and strength deficits    Education / Equipment: HEP for strengthening, coordination, grip and pinch strength Plan: Patient agrees to discharge.  Patient goals were partially met. Patient is being discharged due to being pleased with the current functional level.  ?????     OT Short Term Goals - 06/15/19 1115            OT SHORT TERM GOAL #1   Title  Patient will be educated on a HEP for improved functional use of his left upper extremity with functional tasks.    Time  3    Period  Weeks    Status  On-going    [place] Date  07/04/19        OT SHORT TERM GOAL #2   Title  Paitent will improve LUE strength to 4-/5 or better for improved ability to lift pots and pans when cooking.    Time  3    Period  Weeks    Status  On-going        OT SHORT TERM GOAL #3   Title  Patient will increase LUE GMC from fair + to good - as evidence by increaesd accuracy with finger to nose test.    Time  3    Period  Weeks    Status  On-going        OT SHORT TERM GOAL #4   Title  Patient will increase left grip strength by 5# and pinch strength by 2# for improved ability to maintain grasp on  household items.    Time  3    Period  Weeks    Status  On-going        OT SHORT TERM GOAL #5   Title  Patient will increase fine motor coordination as evidence by improve completion time on nine hole peg test by 10".    Time  3    Period  Weeks    Status  On-going       OT Long Term Goals - 06/15/19 1115            OT LONG TERM GOAL #1   Title  Patient will return to prior level of function with all B/IADLs and leisure activities using his left arm as dominant.    Time  6    Period  Weeks    Status  On-going        OT LONG TERM GOAL #2   Title  Patient will improve GMC to WNL in his left arm for improved safety and accuracy when completing daily tasks and riding his motorcycle.    Time  6    Period  Weeks    Status  On-going  OT LONG TERM GOAL #3   Title  Patient will improve left upper extremity strength to 5/5 for increased ability to lift items overhead when working on his motorcycle and completing house hold tasks.    Time  6    Period  Weeks    Status  On-going        OT LONG TERM GOAL #4   Title  Patient will improve left hand grip strength by 10# and pinch strength by 3# or more in his dominant left hand in order to open containers and maintain grasp on house hold items.    Time  6    Period  Weeks    Status  On-going        OT LONG TERM GOAL #5   Title  Patient will improve left hand fine motor coordination to WNL as evidence by completing the nine hole peg test in 24" or less for improve ability to fasten buttons and manipulate change.    Time  6    Period  Weeks    Status  On-going      Guadelupe Sabin, OTR/L  [phone] 06/28/2019, 2:11 PM  [facility] [address] [place], [place], [address] Phone: [phone]   Fax:  [phone]

## 2019-06-29 ENCOUNTER — Encounter (HOSPITAL_COMMUNITY): Payer: Self-pay

## 2019-06-29 ENCOUNTER — Ambulatory Visit: Payer: PRIVATE HEALTH INSURANCE | Admitting: Pulmonary Disease

## 2019-06-29 NOTE — Therapy (Signed)
[place], [place], [address] Phone: [phone]   Fax:  [phone]  Patient Details  Name: KEYLOR RANDS MRN: [id number] Date of Birth: [birth year] Referring Provider:  No ref. provider found  Encounter Date: 06/29/2019  PHYSICAL THERAPY DISCHARGE SUMMARY  Visits from Start of Care: 8  Current functional level related to goals / functional outcomes: See last treatment note   Remaining deficits: See last treatment note   Education / Equipment: HEP, gait training without AD  Plan: Patient agrees to discharge.  Patient goals were met. Patient is being discharged due to being pleased with the current functional level.  ?????       Per front office staff Alver Fisher pt called on 06/28/19 "He is doing well and wants to be D/c from PT and OT today" .  Talbot Grumbling PT, DPT 06/29/19, 9:22 AM [facility] [address] [place], [place], [address] Phone: [phone]   Fax:  [phone]

## 2019-06-30 ENCOUNTER — Ambulatory Visit (HOSPITAL_COMMUNITY): Payer: PRIVATE HEALTH INSURANCE

## 2019-07-04 ENCOUNTER — Telehealth: Payer: Self-pay | Admitting: *Deleted

## 2019-07-04 NOTE — Telephone Encounter (Signed)
error 

## 2019-07-11 ENCOUNTER — Ambulatory Visit (HOSPITAL_COMMUNITY): Payer: PRIVATE HEALTH INSURANCE

## 2019-07-12 ENCOUNTER — Telehealth: Payer: Self-pay | Admitting: Internal Medicine

## 2019-07-12 ENCOUNTER — Ambulatory Visit (HOSPITAL_COMMUNITY): Payer: PRIVATE HEALTH INSURANCE

## 2019-07-12 NOTE — Telephone Encounter (Addendum)
I informed patient that Dr.Taylors nurse, Kisha Pinnix, will call him tomorrow to schedule procedure.

## 2019-07-12 NOTE — Telephone Encounter (Signed)
Pt is ready to schedule his loop recorder

## 2019-07-13 NOTE — Telephone Encounter (Signed)
Pt notified that [facility] office with call to schedule loop recorder. Pt given number to office and told to call back with any questions.

## 2019-07-14 ENCOUNTER — Ambulatory Visit (HOSPITAL_COMMUNITY): Payer: PRIVATE HEALTH INSURANCE

## 2019-07-17 ENCOUNTER — Telehealth: Payer: Self-pay | Admitting: Pulmonary Disease

## 2019-07-17 NOTE — Telephone Encounter (Signed)
Nucala Order: 132m #1 Vial Order Date: 07/17/19 Expected date of arrival: 07/18/19 Ordered by: LProgress BNigel Mormon

## 2019-07-18 ENCOUNTER — Ambulatory Visit (HOSPITAL_COMMUNITY): Payer: PRIVATE HEALTH INSURANCE

## 2019-07-18 NOTE — Telephone Encounter (Signed)
Nucala Shipment Received: 182m #1 vial Medication arrival date: 07/18/19 Lot #: [id number] Exp date: 02/07/2021 Received by: LElliot Dally

## 2019-07-19 ENCOUNTER — Ambulatory Visit (INDEPENDENT_AMBULATORY_CARE_PROVIDER_SITE_OTHER): Payer: Medicare Other | Admitting: *Deleted

## 2019-07-19 ENCOUNTER — Telehealth: Payer: Self-pay

## 2019-07-19 DIAGNOSIS — J309 Allergic rhinitis, unspecified: Secondary | ICD-10-CM | POA: Diagnosis not present

## 2019-07-19 DIAGNOSIS — J310 Chronic rhinitis: Secondary | ICD-10-CM

## 2019-07-19 NOTE — Telephone Encounter (Signed)
Reviewed notes. He has tried all of our nose sprays at this point. We are going to provide samples of Ryvent to see if this can help with the postnasal drip. We are also referring to ENT for an evaluation. If the [place] works, we can probably send in the generic carbinoxamine because I am certain that Medicare will not cover RyVent. We are also going to freeze him at every two weeks for his allergy shots since this seems to provide some help.  [name], MD Allergy and [place] [facility] [place]

## 2019-07-19 NOTE — Telephone Encounter (Signed)
[name] - can we make Mr. Alvizo every two weeks indefinitely for his shots?   Thanks, [name], MD Allergy and [place] [facility] [place]

## 2019-07-19 NOTE — Telephone Encounter (Signed)
Patient came in stating his nasal sprays are not working.  He would like to know what else to do.  Thanks

## 2019-07-20 ENCOUNTER — Ambulatory Visit (HOSPITAL_COMMUNITY): Payer: PRIVATE HEALTH INSURANCE

## 2019-07-20 DIAGNOSIS — J3089 Other allergic rhinitis: Secondary | ICD-10-CM | POA: Diagnosis not present

## 2019-07-20 NOTE — Progress Notes (Signed)
Vial exp 07-19-20

## 2019-07-20 NOTE — Telephone Encounter (Signed)
Patients referral has been placed to Dr Benjamine Mola office.

## 2019-07-20 NOTE — Telephone Encounter (Signed)
Thank you, Karena Addison!   [name], MD Allergy and [place] [facility] [place]

## 2019-07-21 ENCOUNTER — Ambulatory Visit (HOSPITAL_COMMUNITY): Payer: PRIVATE HEALTH INSURANCE

## 2019-07-21 NOTE — Telephone Encounter (Signed)
Change to Every 2 weeks noted in Immunotherapy Flow Sheet

## 2019-07-24 ENCOUNTER — Other Ambulatory Visit: Payer: Self-pay

## 2019-07-24 ENCOUNTER — Ambulatory Visit (INDEPENDENT_AMBULATORY_CARE_PROVIDER_SITE_OTHER): Payer: Medicare Other

## 2019-07-24 DIAGNOSIS — J4550 Severe persistent asthma, uncomplicated: Secondary | ICD-10-CM

## 2019-07-24 MED ORDER — MEPOLIZUMAB 100 MG ~~LOC~~ SOLR
100.0000 mg | SUBCUTANEOUS | Status: DC
  Administered 2019-07-24: 14:00:00 100 mg via SUBCUTANEOUS

## 2019-07-24 NOTE — Progress Notes (Signed)
All questions were answered by the patient before medication was administered. Have you been hospitalized in the last 10 days? No Do you have a fever? No Do you have a cough? No Do you have a headache or sore throat? No  

## 2019-07-26 ENCOUNTER — Telehealth: Payer: Self-pay | Admitting: Internal Medicine

## 2019-07-26 NOTE — Telephone Encounter (Signed)
Will forward to Dr. Lovena Le to advise.

## 2019-07-26 NOTE — Telephone Encounter (Signed)
Pt is needing to have dental work done- a tooth pulled and they will put an implant in. Was told by the Dentist to call and make sure this would be ok from Cardiology standpoint

## 2019-08-02 ENCOUNTER — Ambulatory Visit (INDEPENDENT_AMBULATORY_CARE_PROVIDER_SITE_OTHER): Payer: Medicare Other

## 2019-08-02 DIAGNOSIS — J309 Allergic rhinitis, unspecified: Secondary | ICD-10-CM | POA: Diagnosis not present

## 2019-08-07 ENCOUNTER — Encounter: Payer: Self-pay | Admitting: *Deleted

## 2019-08-09 NOTE — Telephone Encounter (Signed)
The patient is an acceptable risk from a cardiology standpoint. GT

## 2019-08-14 ENCOUNTER — Telehealth: Payer: Self-pay | Admitting: Pulmonary Disease

## 2019-08-14 NOTE — Telephone Encounter (Signed)
Nucala Order: 139m #1 Vial Order Date: 08/14/2019 Expected date of arrival: 08/15/2019 Ordered by: LDesmond [facility]ike [facility] BNigel [facility]

## 2019-08-15 NOTE — Telephone Encounter (Signed)
Nucala Shipment Received: 123m #1 vial Medication arrival date: 08/15/2019 Lot #: [id number] Exp date: 09/2022 Received by: LDesmond Dike [place]

## 2019-08-16 ENCOUNTER — Ambulatory Visit (INDEPENDENT_AMBULATORY_CARE_PROVIDER_SITE_OTHER): Payer: Medicare Other

## 2019-08-16 DIAGNOSIS — J309 Allergic rhinitis, unspecified: Secondary | ICD-10-CM

## 2019-08-17 ENCOUNTER — Encounter: Payer: Self-pay | Admitting: Internal Medicine

## 2019-08-17 ENCOUNTER — Ambulatory Visit (INDEPENDENT_AMBULATORY_CARE_PROVIDER_SITE_OTHER): Payer: Medicare Other | Admitting: Internal Medicine

## 2019-08-17 ENCOUNTER — Other Ambulatory Visit: Payer: Self-pay

## 2019-08-17 DIAGNOSIS — I639 Cerebral infarction, unspecified: Secondary | ICD-10-CM | POA: Diagnosis not present

## 2019-08-17 NOTE — Progress Notes (Signed)
HPI Caleb Evans returns today for followup and to undergo ILR insertion. He is a pleasant 69 yo man with a cryptogenic stroke, who present to undergo ILR insertion. The patient has no questions regarding the device placement. Allergies  Allergen Reactions  . Chlorpheniramine Other (See Comments)    Sinus congestion  . Amoxicillin-Pot Clavulanate Other (See Comments)    UNSPECIFIED REACTION  Has patient had a PCN reaction causing immediate rash, facial/tongue/throat swelling, SOB or lightheadedness with hypotension: No Has patient had a PCN reaction causing severe rash involving mucus membranes or skin necrosis: No Has patient had a PCN reaction that required hospitalization: No Has patient had a PCN reaction occurring within the last 10 years: Yes If all of the above answers are "NO", then may proceed with Cephalosporin use.  Caused liver failure   . Lisinopril Cough     Current Outpatient Medications  Medication Sig Dispense Refill  . aspirin 81 MG chewable tablet Chew 1 tablet (81 mg total) by mouth daily.    Marland Kitchen atorvastatin (LIPITOR) 80 MG tablet Take 80 mg by mouth at bedtime.    . budesonide-formoterol (SYMBICORT) 160-4.5 MCG/ACT inhaler Inhale 2 puffs into the lungs 2 (two) times daily.    . Calcium Carb-Cholecalciferol (CALCIUM 600+D3 PO) Take 1 tablet by mouth daily.    . Fluticasone Propionate (XHANCE) 93 MCG/ACT EXHU Place 2 puffs into the nose 2 (two) times daily. 32 mL 5  . ipratropium (ATROVENT) 0.06 % nasal spray Place 2 sprays into both nostrils 4 (four) times daily. 15 mL 12  . ipratropium-albuterol (DUONEB) 0.5-2.5 (3) MG/3ML SOLN Take 3 mLs by nebulization every 4 (four) hours as needed.    Marland Kitchen losartan (COZAAR) 25 MG tablet Take 25 mg by mouth daily.    . Mepolizumab (NUCALA) 100 MG/ML SOAJ Inject into the skin.    . Multiple Vitamin (MULTIVITAMIN WITH MINERALS) TABS tablet Take 1 tablet by mouth daily.    Marland Kitchen PROAIR HFA 108 (90 Base) MCG/ACT inhaler INHALE 2  PUFFS EVERY 4 TO 6 HOURS AS NEEDED. 8.5 g 1   Current Facility-Administered Medications  Medication Dose Route Frequency Provider Last Rate Last Admin  . Mepolizumab SOLR 100 mg  100 mg Subcutaneous Q28 days Mannam, Praveen, MD   100 mg at 06/21/18 0809  . Mepolizumab SOLR 100 mg  100 mg Subcutaneous Q28 days Mannam, Praveen, MD   100 mg at 09/23/18 1621  . Mepolizumab SOLR 100 mg  100 mg Subcutaneous Q28 days Mannam, Praveen, MD   100 mg at 12/26/18 1411  . Mepolizumab SOLR 100 mg  100 mg Subcutaneous Q28 days Mannam, Praveen, MD   100 mg at 01/23/19 1405  . Mepolizumab SOLR 100 mg  100 mg Subcutaneous Q28 days Mannam, Praveen, MD   100 mg at 02/23/19 1347  . Mepolizumab SOLR 100 mg  100 mg Subcutaneous Q28 days Mannam, Praveen, MD   100 mg at 03/27/19 1359  . Mepolizumab SOLR 100 mg  100 mg Subcutaneous Q28 days Mannam, Praveen, MD   100 mg at 04/27/19 1413  . Mepolizumab SOLR 100 mg  100 mg Subcutaneous Q28 days Mannam, Praveen, MD   100 mg at 07/24/19 1408   Facility-Administered Medications Ordered in Other Visits  Medication Dose Route Frequency Provider Last Rate Last Admin  . clindamycin (CLEOCIN) 900 mg in dextrose 5 % 50 mL IVPB  900 mg Intravenous 60 min Pre-Op Stark Klein, MD       And  .  gentamicin (GARAMYCIN) 390 mg in dextrose 5 % 50 mL IVPB  5 mg/kg Intravenous 60 min Pre-Op Stark Klein, MD         Past Medical History:  Diagnosis Date  . AAA (abdominal aortic aneurysm) ([place])   . Adrenal nodule ([place])   . Aneurysm of thoracic aorta ([place]) 11/22/2017  . Asthma   . 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 Bladder mass   . Bladder neoplasm   . Cancer [facility])    bladder cancer  . COPD (chronic obstructive pulmonary disease) ([place])   . Crohn's disease ([place])   . CVA (cerebral vascular accident) ([place]) 05/29/2019  . Diverticulitis   . Former smoker 05/29/2019  . GERD (gastroesophageal reflux disease)    takes Nexium off and on  . Hyperlipidemia 11/22/2017  . Over  weight   . Seasonal and perennial allergic rhinitis 04/19/2017  . Skin lesion   . Thoracic aortic aneurysm ([place])   . Weak urinary stream     ROS:   All systems reviewed and negative except as noted in the HPI.   Past Surgical History:  Procedure Laterality Date  . APPENDECTOMY    . CHOLECYSTECTOMY    . LAPAROSCOPIC PARTIAL COLECTOMY  01/18/2018   ERAS PATHWAY  . LAPAROSCOPIC PARTIAL COLECTOMY N/A 01/18/2018   Procedure: LAPAROSCOPIC PARTIAL COLECTOMY ERAS PATHWAY;  Surgeon: Stark Klein, MD;  Location: [facility];  Service: General;  Laterality: N/A;  . TONSILLECTOMY       Family History  Adopted: Yes  Problem Relation Age of Onset  . Healthy Son   . Healthy Daughter   . Bladder Cancer Neg Hx   . Kidney cancer Neg Hx   . Prostate cancer Neg Hx   . Allergic rhinitis Neg Hx   . Angioedema Neg Hx   . Asthma Neg Hx   . Eczema Neg Hx   . Urticaria Neg Hx   . Immunodeficiency Neg Hx      Social History   Socioeconomic History  . Marital status: Married    Spouse name: Not on file  . Number of children: 2  . Years of education: Not on file  . Highest education level: Associate degree: occupational, Hotel manager, or vocational program  Occupational History  . Not on file  Tobacco Use  . Smoking status: Former Smoker    Packs/day: 3.00    Years: 50.00    Pack years: 150.00    Types: Cigarettes    Quit date: 04/10/2018    Years since quitting: 1.3  . Smokeless tobacco: Never Used  Substance and Sexual Activity  . Alcohol use: Yes    Alcohol/week: 0.0 standard drinks    Comment: occasional  . Drug use: No  . Sexual activity: Not on file  Other Topics Concern  . Not on file  Social History Narrative   Patient is adopted   Married, lives with spouse   2 children (boy and girl)   OCCUPATION: Dealer x11yr.  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 Difficulty of Paying Living Expenses: Not on file  Food Insecurity:   . Worried About RCharity fundraiserin the Last Year: Not on file  . Ran Out of Food in  the Last Year: Not on file  Transportation Needs:   . Lack of Transportation (Medical): Not on file  . Lack of Transportation (Non-Medical): Not on file  Physical Activity:   . Days of Exercise per Week: Not on file  . Minutes of Exercise per Session: Not on file  Stress:   . Feeling of Stress : Not on file  Social Connections:   . Frequency of Communication with Friends and Family: Not on file  . Frequency of Social Gatherings with Friends and Family: Not on file  . Attends Religious Services: Not on file  . Active Member of Clubs or Organizations: Not on file  . Attends Archivist Meetings: Not on file  . Marital Status: Not on file  Intimate Partner Violence:   . Fear of Current or Ex-Partner: Not on file  . Emotionally Abused: Not on file  . Physically Abused: Not on file  . Sexually Abused: Not on file     BP 108/78   Pulse 73   Ht 5' 7"  (1.702 m)   Wt 186 lb (84.4 kg)   SpO2 98%   BMI 29.13 kg/m   Physical Exam:  Well appearing NAD HEENT: Unremarkable Neck:  No JVD, no thyromegally Lymphatics:  No adenopathy Back:  No CVA tenderness Lungs:  Clear with no wheezes HEART:  Regular rate rhythm, no murmurs, no rubs, no clicks Abd:  soft, positive bowel sounds, no organomegally, no rebound, no guarding Ext:  2 plus pulses, no edema, no cyanosis, no clubbing Skin:  No rashes no nodules Neuro:  CN II through XII intact, motor grossly intact  ILR insertion - After informed consent was obtained, the patient was prepped in a sterile fashion. 10 cc of lidocaine was infiltrated into the left pectoral region. A one cm stab incision was carried out. The medtronic ILR, #RLB was inserted. The R waves measured 0.3. Hemostasis was assured.  Benzoin and steri-strips were painted on the skin. A bandage was placed. He was recovered in the usual manner.  Assess/Plan: 1. Cryptogenic stroke - he has undergone ILR insertion. He will undergo watchful waiting. Hopefully we will be able to determine the etiology of his stroke with his newly placed ILR. 2. HTN - his bp is well controlled. He will continue his current meds.   Caleb Evans.D.

## 2019-08-17 NOTE — Patient Instructions (Addendum)
Medication Instructions:  Your physician recommends that you continue on your current medications as directed. Please refer to the Current Medication list given to you today.  Labwork: None ordered.  Testing/Procedures: None ordered.  Follow-Up:  You will have a virtual visit with device clinic for a wound check.   August 24, 2019 at 4:00 pm MyChart video  Your physician wants you to follow-up in: one year with Dr. Lovena Le.   You will receive a reminder letter in the mail two months in advance. If you don't receive a letter, please call our office to schedule the follow-up appointment.   Any Other Special Instructions Will Be Listed Below (If Applicable).  If you need a refill on your cardiac medications before your next appointment, please call your pharmacy.    Implantable Loop Recorder Placement, Care After This sheet gives you information about how to care for yourself after your procedure. Your health care provider may also give you more specific instructions. If you have problems or questions, contact your health care provider. What can I expect after the procedure? After the procedure, it is common to have:  Soreness or discomfort near the incision.  Some swelling or bruising near the incision. Follow these instructions at home: Incision care   Follow instructions from your health care provider about how to take care of your incision. Make sure you: ? Leave your outer dressing on for 72 hours.  After 72 hours you can remove that and shower. ? Leave adhesive strips in place. These skin closures may need to stay in place for 2 weeks or longer. If adhesive strip edges start to loosen and curl up, you may trim the loose edges. Do not remove adhesive strips completely unless your health care provider tells you to do that.  Check your incision area every day for signs of infection. Check for: ? Redness, swelling, or pain. ? Fluid or blood. ? Warmth. ? Pus or a bad  smell. Do not take baths, swim, or use a hot tub until your incision is completely healed.  Activity  Return to your normal activities.  General instructions  Follow instructions from your health care provider about how to manage your implantable loop recorder and transmit the information. Learn how to activate a recording if this is necessary for your type of device.  Do not go through a metal detection gate, and do not let someone hold a metal detector over your chest. Show your ID card.  Do not have an MRI unless you check with your health care provider first.  Take over-the-counter and prescription medicines only as told by your health care provider.  Keep all follow-up visits as told by your health care provider. This is important. Contact a health care provider if:  You have redness, swelling, or pain around your incision.  You have a fever.  You have pain that is not relieved by your pain medicine.  You have triggered your device because of fainting (syncope) or because of a heartbeat that feels like it is racing, slow, fluttering, or skipping (palpitations). Get help right away if you have:  Chest pain.  Difficulty breathing. Summary  After the procedure, it is common to have soreness or discomfort near the incision.  Change your dressing as told by your health care provider.  Follow instructions from your health care provider about how to manage your implantable loop recorder and transmit the information.  Keep all follow-up visits as told by your health care provider. This  is important. This information is not intended to replace advice given to you by your health care provider. Make sure you discuss any questions you have with your health care provider. Document Released: 07/08/2015 Document Revised: 09/11/2017 Document Reviewed: 09/11/2017 Elsevier Patient Education  2020 [employer].

## 2019-08-24 ENCOUNTER — Ambulatory Visit (INDEPENDENT_AMBULATORY_CARE_PROVIDER_SITE_OTHER): Payer: Medicare Other

## 2019-08-24 ENCOUNTER — Other Ambulatory Visit: Payer: Self-pay

## 2019-08-24 ENCOUNTER — Telehealth (INDEPENDENT_AMBULATORY_CARE_PROVIDER_SITE_OTHER): Payer: PRIVATE HEALTH INSURANCE | Admitting: *Deleted

## 2019-08-24 DIAGNOSIS — I639 Cerebral infarction, unspecified: Secondary | ICD-10-CM

## 2019-08-24 DIAGNOSIS — J4550 Severe persistent asthma, uncomplicated: Secondary | ICD-10-CM | POA: Diagnosis not present

## 2019-08-24 MED ORDER — MEPOLIZUMAB 100 MG ~~LOC~~ SOLR
100.0000 mg | SUBCUTANEOUS | Status: DC
  Administered 2019-08-24: 14:00:00 100 mg via SUBCUTANEOUS

## 2019-08-24 NOTE — Progress Notes (Signed)
Have you been hospitalized within the last 10 days?  No Do you have a fever?  No Do you have a cough?  No Do you have a headache or sore throat? No  

## 2019-08-29 NOTE — Progress Notes (Signed)
ILR incision site appears to be by video without drainage, redness, or edema. Patient confirmed above assessment. ILR transmission current. Will call office if he develops a fever or has edema, redness, or drainage from wound site.

## 2019-08-30 ENCOUNTER — Ambulatory Visit (INDEPENDENT_AMBULATORY_CARE_PROVIDER_SITE_OTHER): Payer: Medicare Other

## 2019-08-30 DIAGNOSIS — J309 Allergic rhinitis, unspecified: Secondary | ICD-10-CM

## 2019-09-08 ENCOUNTER — Telehealth: Payer: Self-pay | Admitting: Internal Medicine

## 2019-09-08 ENCOUNTER — Telehealth: Payer: Self-pay | Admitting: Pulmonary Disease

## 2019-09-08 NOTE — Telephone Encounter (Signed)
Wants to make sure it's ok for him to have the [place] vaccine-- [phone]

## 2019-09-08 NOTE — Telephone Encounter (Signed)
According to notation already in the system the patient has already contacted cardiology regarding the vaccine. Nothing further needed at this time.

## 2019-09-08 NOTE — Telephone Encounter (Signed)
Informed pt that physicians would like to patients to get Covid vaccine. He voiced understanding and appreciated the call.

## 2019-09-11 ENCOUNTER — Telehealth: Payer: Self-pay | Admitting: Pulmonary Disease

## 2019-09-11 NOTE — Telephone Encounter (Signed)
Nucala Order: 144m #1 Vial Order Date: 09/11/19 Expected date of arrival: 09/12/19 Ordered by: LPrinceton BNigel Mormon

## 2019-09-12 NOTE — Telephone Encounter (Signed)
Nucala Shipment Received: 128m #1 vial Medication arrival date: 09/12/19 Lot #: [id number] Exp date: 06/10/2021 Received by: LElliot Dally

## 2019-09-13 ENCOUNTER — Ambulatory Visit (INDEPENDENT_AMBULATORY_CARE_PROVIDER_SITE_OTHER): Payer: Medicare Other

## 2019-09-13 DIAGNOSIS — J309 Allergic rhinitis, unspecified: Secondary | ICD-10-CM

## 2019-09-21 ENCOUNTER — Other Ambulatory Visit: Payer: Self-pay

## 2019-09-21 ENCOUNTER — Ambulatory Visit (INDEPENDENT_AMBULATORY_CARE_PROVIDER_SITE_OTHER): Payer: Medicare Other

## 2019-09-21 DIAGNOSIS — J4550 Severe persistent asthma, uncomplicated: Secondary | ICD-10-CM | POA: Diagnosis not present

## 2019-09-21 MED ORDER — MEPOLIZUMAB 100 MG ~~LOC~~ SOLR
100.0000 mg | SUBCUTANEOUS | Status: DC
  Administered 2019-09-21: 100 mg via SUBCUTANEOUS

## 2019-09-21 NOTE — Progress Notes (Signed)
Have you been hospitalized within the last 10 days?  No Do you have a fever?  No Do you have a cough?  No Do you have a headache or sore throat? No  

## 2019-09-22 ENCOUNTER — Encounter: Payer: Self-pay | Admitting: Urology

## 2019-09-25 ENCOUNTER — Ambulatory Visit (INDEPENDENT_AMBULATORY_CARE_PROVIDER_SITE_OTHER): Payer: Medicare Other | Admitting: *Deleted

## 2019-09-25 DIAGNOSIS — I639 Cerebral infarction, unspecified: Secondary | ICD-10-CM

## 2019-09-25 LAB — CUP PACEART REMOTE DEVICE CHECK
Date Time Interrogation Session: 20210214013412
Implantable Pulse Generator Implant Date: 20210107

## 2019-09-25 NOTE — Progress Notes (Signed)
ILR Remote 

## 2019-09-27 ENCOUNTER — Ambulatory Visit (INDEPENDENT_AMBULATORY_CARE_PROVIDER_SITE_OTHER): Payer: Medicare Other

## 2019-09-27 DIAGNOSIS — J309 Allergic rhinitis, unspecified: Secondary | ICD-10-CM

## 2019-10-04 ENCOUNTER — Ambulatory Visit (INDEPENDENT_AMBULATORY_CARE_PROVIDER_SITE_OTHER): Payer: Medicare Other

## 2019-10-04 DIAGNOSIS — J309 Allergic rhinitis, unspecified: Secondary | ICD-10-CM

## 2019-10-09 ENCOUNTER — Telehealth: Payer: Self-pay | Admitting: Pulmonary Disease

## 2019-10-09 NOTE — Telephone Encounter (Signed)
Nucala Order: 172m #1 Vial Order Date: 10/09/2019 Expected date of arrival: 10/10/2019 Ordered by: LDesmond Dike [facility] BNigel Mormon

## 2019-10-10 NOTE — Telephone Encounter (Signed)
Nucala Shipment Received: 194m #1 vial Medication arrival date: 10/10/2019 Lot #: [id number] Exp date: 01/2023 Received by: LDesmond Dike [place]

## 2019-10-13 ENCOUNTER — Encounter: Payer: Self-pay | Admitting: Allergy & Immunology

## 2019-10-13 ENCOUNTER — Ambulatory Visit (INDEPENDENT_AMBULATORY_CARE_PROVIDER_SITE_OTHER): Payer: Medicare Other | Admitting: Allergy & Immunology

## 2019-10-13 ENCOUNTER — Other Ambulatory Visit: Payer: Self-pay

## 2019-10-13 VITALS — BP 138/84 | HR 107 | Temp 97.4°F | Resp 18

## 2019-10-13 DIAGNOSIS — T50Z95D Adverse effect of other vaccines and biological substances, subsequent encounter: Secondary | ICD-10-CM | POA: Diagnosis not present

## 2019-10-13 DIAGNOSIS — J302 Other seasonal allergic rhinitis: Secondary | ICD-10-CM

## 2019-10-13 DIAGNOSIS — J3089 Other allergic rhinitis: Secondary | ICD-10-CM

## 2019-10-13 NOTE — Patient Instructions (Addendum)
1. Concern for vaccine reaction - Testing today was negative, including the challenge to the Miralax (PEG). - I think that you will tolerate the COVID vaccine well. - There is currently no definite skin testing available for the [place] vaccine and data is limited however, there has been a concern regarding sensitivity to PEG in patients who had anaphylactic reactions to the COVID-19 vaccine. - You clinical history is less likely to be IgE mediated and did not meet criteria for anaphylaxis and did not require epinephrine, but we did the testing out of an abundance of caution.  - Skin testing was negative to Miralax (source of PEG 3350), methylprednisolone acetate (source of PEG 3350), and triamcinolone acetonide (source of polysorbate-80). - Ideally, after a negative skin testing a test dose of the vaccine is given in the office but as we do not have access to the vaccine we were unable to do that part. - Call us when you receive the vaccine to give [place] an update.   2. Return in about 2 months (around 12/13/2019) for a regular appointment. This can be an in-person, a virtual Webex or a telephone follow up visit.   Please inform us of any Emergency Department visits, hospitalizations, or changes in symptoms. Call us before going to the ED for breathing or allergy symptoms since we might be able to fit you in for a sick visit. Feel free to contact us anytime with any questions, problems, or concerns.  It was a pleasure to see you again today!  Websites that have reliable patient information: 1. American Academy of Asthma, Allergy, and Immunology: www.aaaai.org 2. Food Allergy Research and Education (FARE): foodallergy.org 3. Mothers of Asthmatics: http://www.asthmacommunitynetwork.org 4. American College of Allergy, Asthma, and Immunology: www.acaai.org   COVID-19 Vaccine Information can be found at: ShippingScam.co.uk For questions  related to vaccine distribution or appointments, please email vaccine@ .com or call [phone].     "Like" [place] on Facebook and Instagram for our latest updates!        Make sure you are registered to vote! If you have moved or changed any of your contact information, you will need to get this updated before voting!  In some cases, you MAY be able to register to vote online: [email or web address]

## 2019-10-13 NOTE — Progress Notes (Signed)
FOLLOW UP  Date of Service/Encounter:  10/13/19   Assessment:   Severe persistent asthma, uncomplicated - followed by Pulmonology  Chronic nonseasonal allergic rhinitis (trees, weeds, mold, dust mite)- on allergen immunotherapy  Previous World Location manager  Recent stroke   Plan/Recommendations:   1. Concern for vaccine reaction - Testing today was negative, including the challenge to the Miralax (PEG). - I think that you will tolerate the COVID vaccine well. - There is currently no definite skin testing available for the [place] vaccine and data is limited however, there has been a concern regarding sensitivity to PEG in patients who had anaphylactic reactions to the COVID-19 vaccine. - You clinical history is less likely to be IgE mediated and did not meet criteria for anaphylaxis and did not require epinephrine, but we did the testing out of an abundance of caution.  - Skin testing was negative to Miralax (source of PEG 3350), methylprednisolone acetate (source of PEG 3350), and triamcinolone acetonide (source of polysorbate-80). - Ideally, after a negative skin testing a test dose of the vaccine is given in the office but as we do not have access to the vaccine we were unable to do that part. - Call us when you receive the vaccine to give [place] an update.  2. Return in about 2 months (around 12/13/2019) for a regular appointment. This can be an in-person, a virtual Webex or a telephone follow up visit.   Subjective:   Caleb Evans is a 69 y.o. male presenting today for follow up of  Chief Complaint  Patient presents with  . Food/Drug Challenge    COVID Testing    Caleb Evans has a history of the following: Patient Active Problem List   Diagnosis Date Noted  . Cryptogenic stroke ([place]) 08/17/2019  . Medication management 06/13/2019  . CVA (cerebral vascular accident) ([place]) 05/29/2019  . Former smoker 05/29/2019  . Aneurysm of  thoracic aorta ([place]) 11/22/2017  . Hyperlipidemia 11/22/2017  . AAA (abdominal aortic aneurysm) without rupture ([place]) 11/22/2017  . Seasonal and perennial allergic rhinitis 04/19/2017  . Severe persistent asthma, uncomplicated 02/54/2706  . GERD (gastroesophageal reflux disease) 04/08/2016  . Chronic nonseasonal allergic rhinitis due to fungal spores 01/17/2014  . 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 Mild chronic obstructive pulmonary disease ([place]) 01/17/2014    History obtained from: chart review and patient.  Caleb Evans is a 69 y.o. male presenting for skin testing.  Caleb Evans was last seen in November 2020.  At that time, his spirometry looks slightly lower.  We started him on a prednisone burst.  We continued Symbicort 160/4.5 mcg 2 puffs twice daily as well as Spiriva 1 puff daily. Caleb Evans is also on Nucala through [facility] and dosing well with these. For his allergic rhinitis, we stopped the [place] since that stopped working. We added on nasal Atrovent and continued with nasal saline rinses. We continued with allergy shots at the same schedule.  Since the last visit, Caleb Evans has done well. Caleb Evans came to [place] during his last allergy shot to discuss COVID19 vaccine reactions. Apparently Caleb Evans had a stroke two weeks after his influenza vaccine. There was no definitive determination that the stroke was related to the flu vaccine, but Caleb Evans preferred to do testing just to make him feel better. Caleb Evans has been off of his antihistamines for three days.   Otherwise, there have been no changes to his past medical history, surgical history, family history, or social history.  Review of Systems  Constitutional: Negative.  Negative for chills, fever, malaise/fatigue and weight loss.  HENT: Positive for congestion. Negative for ear discharge, ear pain and nosebleeds.   Eyes: Negative for pain, discharge and redness.  Respiratory: Negative for cough, sputum production, shortness of breath and wheezing.     Cardiovascular: Negative.  Negative for chest pain and palpitations.  Gastrointestinal: Negative for abdominal pain, constipation, diarrhea, heartburn, nausea and vomiting.  Skin: Negative.  Negative for itching and rash.  Neurological: Negative for dizziness and headaches.  Endo/Heme/Allergies: Negative for environmental allergies. Does not bruise/bleed easily.       Objective:   Blood pressure 138/84, pulse (!) 107, temperature (!) 97.4 F (36.3 C), temperature source Temporal, resp. rate 18, SpO2 100 %. There is no height or weight on file to calculate BMI.   Physical Exam:  Physical Exam  Constitutional: Caleb Evans appears well-developed and well-nourished.  HENT:  Head: Normocephalic and atraumatic.  Right Ear: Tympanic membrane, external ear and ear canal normal.  Left Ear: Tympanic membrane, external ear and ear canal normal.  Nose: Mucosal edema and rhinorrhea present. No nasal deformity or septal deviation. No epistaxis. Right sinus exhibits no maxillary sinus tenderness and no frontal sinus tenderness. Left sinus exhibits no maxillary sinus tenderness and no frontal sinus tenderness.  Mouth/Throat: Uvula is midline and oropharynx is clear and moist. Mucous membranes are not pale and not dry.  Eyes: Pupils are equal, round, and reactive to light. Conjunctivae and EOM are normal. Right eye exhibits no chemosis and no discharge. Left eye exhibits no chemosis and no discharge. Right conjunctiva is not injected. Left conjunctiva is not injected.  Cardiovascular: Normal rate, regular rhythm and normal heart sounds.  Respiratory: Effort normal and breath sounds normal. No accessory muscle usage. No tachypnea. No respiratory distress. Caleb Evans has no wheezes. Caleb Evans has no rhonchi. Caleb Evans has no rales. Caleb Evans exhibits no tenderness.  Neurological: Caleb Evans is alert.  Skin: No abrasion, no petechiae and no rash noted. Rash is not papular, not vesicular and not urticarial. No erythema. No pallor.  Psychiatric: Caleb Evans  has a normal mood and affect.     Diagnostic studies:   Allergy Studies:       ORAL CHALLENGE        [name], MD  [facility]

## 2019-10-18 ENCOUNTER — Telehealth: Payer: Self-pay | Admitting: Pulmonary Disease

## 2019-10-19 ENCOUNTER — Ambulatory Visit: Payer: PRIVATE HEALTH INSURANCE

## 2019-10-19 NOTE — Telephone Encounter (Signed)
Pt has canceled his injection to get his COVID vaccine. Pt has rescheduled his injection for 3/19. Nothing further needed at this time.

## 2019-10-19 NOTE — Progress Notes (Signed)
Virtual Visit via Video Note The purpose of this virtual visit is to provide medical care while limiting exposure to the novel coronavirus.    Consent was obtained for video visit:  Yes.   Answered questions that patient had about telehealth interaction:  Yes.   I discussed the limitations, risks, security and privacy concerns of performing an evaluation and management service by telemedicine. I also discussed with the patient that there may be a patient responsible charge related to this service. The patient expressed understanding and agreed to proceed.  Pt location: Home Physician Location: office Name of referring provider:  Theotis Burrow* I connected with [name] at patients initiation/request on 10/20/2019 at  3:30 PM EST by video enabled telemedicine application and verified that I am speaking with the correct person using two identifiers. Pt MRN:  [id number] Pt DOB:  [birth year] Video Participants:  [name]   History of Present Illness:  Caleb Evans is a 69 year old right-handed Caucasian male with COPD, Crohn's disease, AAA, and history of bladder cancer who follows up for stroke.  UPDATE: Current medications:  ASA 22m; Lipitor 846m losartan  He is doing well.  Status post implantable loop recorder.  No arrhythmia thus far.  Blood pressure runs111-120/90  HISTORY: [facility] for acute onset of left sided weakness and unsteady gait.  The previous day, he noted feeling unsteady on his feet.  The next morning, he noted left upper extremity and left lower extremity weakness.  Initially, he noted some numbness as well.  CT head showed chronic small vessel ischemic changes and remote right basal ganglia lacunar infarct but no acute intracranial abnormality.  He did not receive tPA as he was outside the therapeutic window.  CT of brain showed mild chronic small vessel disease and remote small lacunar  infarcts involving the right putamen and left centrum semiovale but no acute intracranial abnormalities.  He was unable to get an MRI due to metal in his eye.  CTA of head and neck showed no intracranial or extracranial emergent large vessel stenosis or occlusion.  Carotid doppler showed no hemodynamically significant stenosis.  2D echocardiogram showed EF 55-60% with no cardiac source of embolus.  LDL was 50 with TG 259.  Hgb A1c was 6.1.  HIV and coronovirus testing were negative.  EEG was normal.  He was continued on Lipitor 8015maily and started on ASA 15m40mily.    He still has weakness in the left arm.  Left leg is improving.  Past Medical History: Past Medical History:  Diagnosis Date  . AAA (abdominal aortic aneurysm) (HCC)[place]. Adrenal nodule (HCC)[place]. Aneurysm of thoracic aorta (HCC)[place]15/2019  . Asthma   . 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 Bladder mass   . Bladder neoplasm   . [facility]  . COPD (chronic obstructive pulmonary disease) (HCC)[place]. Crohn's disease (HCC)[place]. CVA (cerebral vascular accident) (HCC)[place]/19/2020  . Diverticulitis   . Former smoker 05/29/2019  . GERD (gastroesophageal reflux disease)    takes Nexium off and on  . Hyperlipidemia 11/22/2017  . Over weight   . Seasonal and perennial allergic rhinitis 04/19/2017  . Skin lesion   . Thoracic aortic aneurysm (HCC)[place]. Weak urinary stream     Medications: Outpatient Encounter Medications as of 10/20/2019  Medication Sig  . aspirin 81 MG  chewable tablet Chew 1 tablet (81 mg total) by mouth daily.  Marland Kitchen atorvastatin (LIPITOR) 80 MG tablet Take 80 mg by mouth at bedtime.  . budesonide-formoterol (SYMBICORT) 160-4.5 MCG/ACT inhaler Inhale 2 puffs into the lungs 2 (two) times daily.  . Calcium Carb-Cholecalciferol (CALCIUM 600+D3 PO) Take 1 tablet by mouth daily.  . fluticasone (FLONASE) 50 MCG/ACT nasal spray Place 2 sprays into both nostrils daily.  Marland Kitchen ipratropium-albuterol  (DUONEB) 0.5-2.5 (3) MG/3ML SOLN Take 3 mLs by nebulization every 4 (four) hours as needed.  Marland Kitchen losartan (COZAAR) 25 MG tablet Take 25 mg by mouth daily.  . Mepolizumab (NUCALA) 100 MG/ML SOAJ Inject into the skin.  . Multiple Vitamin (MULTIVITAMIN WITH MINERALS) TABS tablet Take 1 tablet by mouth daily.  Marland Kitchen PROAIR HFA 108 (90 Base) MCG/ACT inhaler INHALE 2 PUFFS EVERY 4 TO 6 HOURS AS NEEDED.   Facility-Administered Encounter Medications as of 10/20/2019  Medication  . clindamycin (CLEOCIN) 900 mg in dextrose 5 % 50 mL IVPB   And  . gentamicin (GARAMYCIN) 390 mg in dextrose 5 % 50 mL IVPB  . Mepolizumab SOLR 100 mg  . Mepolizumab SOLR 100 mg  . Mepolizumab SOLR 100 mg  . Mepolizumab SOLR 100 mg  . Mepolizumab SOLR 100 mg  . Mepolizumab SOLR 100 mg  . Mepolizumab SOLR 100 mg  . Mepolizumab SOLR 100 mg  . Mepolizumab SOLR 100 mg  . Mepolizumab SOLR 100 mg    Allergies: Allergies  Allergen Reactions  . Chlorpheniramine Other (See Comments)    Sinus congestion  . Amoxicillin-Pot Clavulanate Other (See Comments)    UNSPECIFIED REACTION  Has patient had a PCN reaction causing immediate rash, facial/tongue/throat swelling, SOB or lightheadedness with hypotension: No Has patient had a PCN reaction causing severe rash involving mucus membranes or skin necrosis: No Has patient had a PCN reaction that required hospitalization: No Has patient had a PCN reaction occurring within the last 10 years: Yes If all of the above answers are "NO", then may proceed with Cephalosporin use.  Caused liver failure   . Lisinopril Cough    Family History: Family History  Adopted: Yes  Problem Relation Age of Onset  . Healthy Son   . Healthy Daughter   . Bladder Cancer Neg Hx   . Kidney cancer Neg Hx   . Prostate cancer Neg Hx   . Allergic rhinitis Neg Hx   . Angioedema Neg Hx   . Asthma Neg Hx   . Eczema Neg Hx   . Urticaria Neg Hx   . Immunodeficiency Neg Hx   . Atopy Neg Hx     Social  History: Social History   Socioeconomic History  . Marital status: Married    Spouse name: Not on file  . Number of children: 2  . Years of education: Not on file  . Highest education level: Associate degree: occupational, Hotel manager, or vocational program  Occupational History  . Not on file  Tobacco Use  . Smoking status: Former Smoker    Packs/day: 3.00    Years: 50.00    Pack years: 150.00    Types: Cigarettes    Quit date: 04/10/2018    Years since quitting: 1.5  . Smokeless tobacco: Never Used  Substance and Sexual Activity  . Alcohol use: Yes    Alcohol/week: 0.0 standard drinks    Comment: occasional  . Drug use: No  . Sexual activity: Not on file  Other Topics Concern  . Not on file  Social History Narrative   Patient is adopted   Married, lives with spouse   2 children (boy and girl)   OCCUPATION: Dealer x90yr.  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 Difficulty of Paying Living Expenses:   Food Insecurity:   . Worried About RCharity fundraiserin the Last Year:   . RArboriculturistin the Last Year:   Transportation Needs:   . LFilm/video editor(Medical):   .Marland KitchenLack of Transportation (Non-Medical):   Physical Activity:   . Days of Exercise per Week:   . Minutes of Exercise per Session:   Stress:   . Feeling of Stress :   Social Connections:   . Frequency of Communication with Friends and Family:   . Frequency of Social Gatherings with Friends and Family:   . Attends Religious Services:   . Active Member of Clubs or Organizations:   . Attends CArchivistMeetings:   .Marland KitchenMarital Status:   Intimate Partner Violence:   . Fear of Current or Ex-Partner:   . Emotionally Abused:   .Marland KitchenPhysically Abused:   . Sexually Abused:     Observations/Objective:     Height 5' 7"  (1.702 m), weight 180 lb (81.6 kg). No acute distress.  Alert and oriented.  Speech fluent and not dysarthric.  Language intact.  Eyes orthophoric on primary gaze.  Face symmetric.  Assessment and Plan:   1.  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  Hyperlipidemia 3.  Essential hypertension  1.  ASA 864mdaily for secondary stroke prevention 2.  Lipitor 8052maily (LDL goal less than 70) 3.  Blood pressure control 4.  Mediterranean diet 5.  Routine exercise 6.  Follow up in 6 months  Follow Up Instructions:    -I discussed the assessment and treatment plan with the patient. The patient was provided an opportunity to ask questions and all were answered. The patient agreed with the plan and demonstrated an understanding of the instructions.   The patient was advised to call back or seek an in-person evaluation if the symptoms worsen or if the condition fails to improve as anticipated.   AdaDudley MajorO

## 2019-10-20 ENCOUNTER — Encounter: Payer: Self-pay | Admitting: Neurology

## 2019-10-20 ENCOUNTER — Telehealth (INDEPENDENT_AMBULATORY_CARE_PROVIDER_SITE_OTHER): Payer: Medicare Other | Admitting: Neurology

## 2019-10-20 ENCOUNTER — Other Ambulatory Visit: Payer: Self-pay

## 2019-10-20 ENCOUNTER — Ambulatory Visit: Payer: Medicare Other | Attending: Internal Medicine

## 2019-10-20 VITALS — Ht 67.0 in | Wt 180.0 lb

## 2019-10-20 DIAGNOSIS — I639 Cerebral infarction, unspecified: Secondary | ICD-10-CM | POA: Diagnosis not present

## 2019-10-20 DIAGNOSIS — I10 Essential (primary) hypertension: Secondary | ICD-10-CM | POA: Diagnosis not present

## 2019-10-20 DIAGNOSIS — Z23 Encounter for immunization: Secondary | ICD-10-CM

## 2019-10-20 NOTE — Progress Notes (Signed)
   Covid-19 Vaccination Clinic  Name:  Caleb Evans    MRN: [id number] DOB: 06-29-1951  10/20/2019  Mr. [name] was observed post Covid-19 immunization for 15 minutes without incident. He was provided with Vaccine Information Sheet and instruction to access the V-Safe system.   Mr. [name] was instructed to call 911 with any severe reactions post vaccine: Marland Kitchen Difficulty breathing  . Swelling of face and throat  . A fast heartbeat  . A bad rash all over body  . Dizziness and weakness   Immunizations Administered    Name Date Dose VIS Date Route   Moderna COVID-19 Vaccine 10/20/2019 10:23 AM 0.5 mL 07/11/2019 Intramuscular   Manufacturer: Moderna   Lot: [id number]   [place]: [id number]

## 2019-10-23 ENCOUNTER — Other Ambulatory Visit: Payer: Medicare Other | Admitting: Urology

## 2019-10-25 ENCOUNTER — Encounter: Payer: Self-pay | Admitting: Urology

## 2019-10-25 ENCOUNTER — Ambulatory Visit (INDEPENDENT_AMBULATORY_CARE_PROVIDER_SITE_OTHER): Payer: Medicare Other | Admitting: Urology

## 2019-10-25 ENCOUNTER — Ambulatory Visit (INDEPENDENT_AMBULATORY_CARE_PROVIDER_SITE_OTHER): Payer: Medicare Other

## 2019-10-25 ENCOUNTER — Other Ambulatory Visit: Payer: Self-pay

## 2019-10-25 VITALS — BP 124/73 | HR 73 | Ht 67.0 in | Wt 180.0 lb

## 2019-10-25 DIAGNOSIS — J309 Allergic rhinitis, unspecified: Secondary | ICD-10-CM | POA: Diagnosis not present

## 2019-10-25 DIAGNOSIS — Z126 Encounter for screening for malignant neoplasm of bladder: Secondary | ICD-10-CM | POA: Diagnosis not present

## 2019-10-25 NOTE — Progress Notes (Signed)
Cystoscopy Procedure Note:  Indication:  Hx of LG Ta: TURBT w/Dr. Elnoria Howard 08/2014, 1cm LG Ta urothelial cell carcinoma No hx of recurrence Quit smoking 2019  After informed consent and discussion of the procedure and its risks, Caleb Evans was positioned and prepped in the standard fashion. Cystoscopy was performed with a flexible cystoscope. The urethra, bladder neck and entire bladder was visualized in a standard fashion. The prostate was moderate in size. The ureteral orifices were visualized in their normal location and orientation. Bladder mucosa grossly normal throughout, no abnormalities on retroflexion.  Findings: Normal cystoscopy  Assessment and Plan: We discussed the AUA guidelines that do not recommend routine cystoscopy after 5 years of screening for LG bladder cancer.  We discussed alternative options including every other year screening, versus following up as needed.  He would like to follow-up as needed.  We discussed the low, but not zero, risk of recurrence.  We discussed return precautions at length including gross hematuria or urinary symptoms.  Nickolas Madrid, MD 10/25/2019

## 2019-10-26 ENCOUNTER — Ambulatory Visit (INDEPENDENT_AMBULATORY_CARE_PROVIDER_SITE_OTHER): Payer: Medicare Other | Admitting: *Deleted

## 2019-10-26 DIAGNOSIS — I639 Cerebral infarction, unspecified: Secondary | ICD-10-CM

## 2019-10-26 LAB — MICROSCOPIC EXAMINATION
Bacteria, UA: NONE SEEN
RBC, Urine: NONE SEEN /hpf (ref 0–2)

## 2019-10-26 LAB — URINALYSIS, COMPLETE
Bilirubin, UA: NEGATIVE
Glucose, UA: NEGATIVE
Ketones, UA: NEGATIVE
Leukocytes,UA: NEGATIVE
Nitrite, UA: NEGATIVE
Protein,UA: NEGATIVE
RBC, UA: NEGATIVE
Specific Gravity, UA: 1.025 (ref 1.005–1.030)
Urobilinogen, Ur: 0.2 mg/dL (ref 0.2–1.0)
pH, UA: 6 (ref 5.0–7.5)

## 2019-10-26 LAB — CUP PACEART REMOTE DEVICE CHECK
Date Time Interrogation Session: 20210317013149
Implantable Pulse Generator Implant Date: 20210107

## 2019-10-27 ENCOUNTER — Other Ambulatory Visit: Payer: Self-pay

## 2019-10-27 ENCOUNTER — Ambulatory Visit (INDEPENDENT_AMBULATORY_CARE_PROVIDER_SITE_OTHER): Payer: Medicare Other

## 2019-10-27 DIAGNOSIS — J4550 Severe persistent asthma, uncomplicated: Secondary | ICD-10-CM | POA: Diagnosis not present

## 2019-10-27 MED ORDER — MEPOLIZUMAB 100 MG ~~LOC~~ SOLR
100.0000 mg | Freq: Once | SUBCUTANEOUS | Status: AC
  Administered 2019-10-27: 100 mg via SUBCUTANEOUS

## 2019-10-27 MED ORDER — MEPOLIZUMAB 100 MG ~~LOC~~ SOLR: 100.0000 mg | Freq: Every day | SUBCUTANEOUS | Status: DC

## 2019-10-27 NOTE — Progress Notes (Signed)
Have you been hospitalized within the last 10 days?  No Do you have a fever?  No Do you have a cough?  No Do you have a headache or sore throat? No Do you have your Epi Pen visible and is it within date?  Yes 

## 2019-10-27 NOTE — Progress Notes (Signed)
ILR Remote 

## 2019-11-01 ENCOUNTER — Ambulatory Visit (INDEPENDENT_AMBULATORY_CARE_PROVIDER_SITE_OTHER): Payer: Medicare Other

## 2019-11-01 DIAGNOSIS — J309 Allergic rhinitis, unspecified: Secondary | ICD-10-CM

## 2019-11-08 ENCOUNTER — Ambulatory Visit (INDEPENDENT_AMBULATORY_CARE_PROVIDER_SITE_OTHER): Payer: Medicare Other

## 2019-11-08 DIAGNOSIS — J309 Allergic rhinitis, unspecified: Secondary | ICD-10-CM

## 2019-11-13 ENCOUNTER — Telehealth: Payer: Self-pay | Admitting: Pulmonary Disease

## 2019-11-13 NOTE — Telephone Encounter (Signed)
Nucala Order: 115m #1 Vial Order Date: 11/13/2019 Expected date of arrival: 11/14/2019 Ordered by: LDesmond Dike [facility]

## 2019-11-14 NOTE — Telephone Encounter (Signed)
Nucala Shipment Received: 185m #1 vial Medication arrival date: 11/14/2019 Lot #: [id number] date: 01/2023 Received by: LDesmond Dike CB[place]

## 2019-11-21 ENCOUNTER — Ambulatory Visit: Payer: Medicare Other | Attending: Internal Medicine

## 2019-11-21 DIAGNOSIS — Z23 Encounter for immunization: Secondary | ICD-10-CM

## 2019-11-21 NOTE — Progress Notes (Signed)
[facility]  Name:  MARCELUS DUBBERLY    MRN: [id number] DOB: 10/28/1950  11/21/2019  Mr. Stick was observed post Covid-19 immunization for 15 minutes without incident. He was provided with Vaccine Information Sheet and instruction to access the V-Safe system.   Mr. Keziah was instructed to call 911 with any severe reactions post vaccine: Marland Kitchen Difficulty breathing  . Swelling of face and throat  . A fast heartbeat  . A bad rash all over body  . Dizziness and weakness   Immunizations Administered    Name Date Dose VIS Date Route   Moderna COVID-19 Vaccine 11/21/2019 11:23 AM 0.5 mL 07/11/2019 Intramuscular   Manufacturer: Moderna   Lot: [id number]   [place]: [id number]

## 2019-11-24 ENCOUNTER — Ambulatory Visit: Payer: Medicare Other

## 2019-11-26 LAB — CUP PACEART REMOTE DEVICE CHECK
Date Time Interrogation Session: 20210417230346
Implantable Pulse Generator Implant Date: 20210107

## 2019-11-27 ENCOUNTER — Ambulatory Visit (INDEPENDENT_AMBULATORY_CARE_PROVIDER_SITE_OTHER): Payer: Medicare Other | Admitting: *Deleted

## 2019-11-27 DIAGNOSIS — I639 Cerebral infarction, unspecified: Secondary | ICD-10-CM | POA: Diagnosis not present

## 2019-11-28 NOTE — Progress Notes (Signed)
ILR Remote 

## 2019-11-29 ENCOUNTER — Ambulatory Visit: Payer: Medicare Other

## 2019-12-01 ENCOUNTER — Other Ambulatory Visit: Payer: Self-pay

## 2019-12-01 ENCOUNTER — Ambulatory Visit (INDEPENDENT_AMBULATORY_CARE_PROVIDER_SITE_OTHER): Payer: Medicare Other

## 2019-12-01 DIAGNOSIS — J4550 Severe persistent asthma, uncomplicated: Secondary | ICD-10-CM | POA: Diagnosis not present

## 2019-12-01 MED ORDER — MEPOLIZUMAB 100 MG ~~LOC~~ SOLR
100.0000 mg | SUBCUTANEOUS | Status: DC
  Administered 2019-12-01: 100 mg via SUBCUTANEOUS

## 2019-12-01 NOTE — Progress Notes (Signed)
All questions were answered by the patient before medication was administered. Have you been hospitalized in the last 10 days? No Do you have a fever? No Do you have a cough? No Do you have a headache or sore throat? No  

## 2019-12-06 ENCOUNTER — Ambulatory Visit (INDEPENDENT_AMBULATORY_CARE_PROVIDER_SITE_OTHER): Payer: Medicare Other

## 2019-12-06 DIAGNOSIS — J309 Allergic rhinitis, unspecified: Secondary | ICD-10-CM

## 2019-12-14 ENCOUNTER — Telehealth: Payer: Self-pay

## 2019-12-14 DIAGNOSIS — Z87891 Personal history of nicotine dependence: Secondary | ICD-10-CM

## 2019-12-14 DIAGNOSIS — Z122 Encounter for screening for malignant neoplasm of respiratory organs: Secondary | ICD-10-CM

## 2019-12-14 NOTE — Telephone Encounter (Signed)
I contacted patient today to schedule is annual lung screening CT scan.  Patient had his last scan 12/30/2018.  Patient agreeable and scan has been scheduled for June 10 at 3:30.  Patient knows where to go as he got scan last year.  Patient has no change in health concerns.  He has had COVID vaccines, but they will be more than 4 weeks between COVID vaccines and scan on 01/18/2020.  Patient was appreciative for call.

## 2019-12-15 ENCOUNTER — Encounter: Payer: Self-pay | Admitting: Allergy & Immunology

## 2019-12-15 ENCOUNTER — Other Ambulatory Visit: Payer: Self-pay

## 2019-12-15 ENCOUNTER — Ambulatory Visit (INDEPENDENT_AMBULATORY_CARE_PROVIDER_SITE_OTHER): Payer: Medicare Other | Admitting: Allergy & Immunology

## 2019-12-15 VITALS — BP 134/78 | HR 77 | Temp 97.7°F | Resp 18

## 2019-12-15 DIAGNOSIS — J3089 Other allergic rhinitis: Secondary | ICD-10-CM

## 2019-12-15 DIAGNOSIS — H1010 Acute atopic conjunctivitis, unspecified eye: Secondary | ICD-10-CM | POA: Diagnosis not present

## 2019-12-15 DIAGNOSIS — J4550 Severe persistent asthma, uncomplicated: Secondary | ICD-10-CM

## 2019-12-15 DIAGNOSIS — J302 Other seasonal allergic rhinitis: Secondary | ICD-10-CM | POA: Diagnosis not present

## 2019-12-15 DIAGNOSIS — I639 Cerebral infarction, unspecified: Secondary | ICD-10-CM

## 2019-12-15 NOTE — Progress Notes (Signed)
FOLLOW UP  Date of Service/Encounter:  12/15/19   Assessment:   Severe persistent asthma, uncomplicated  Chronic nonseasonal allergic rhinitis (trees, weeds, mold, dust mite)- on allergen immunotherapy  Previous World Location manager  Recent stroke  Plan/Recommendations:   Asthma Continue Symbicort 160/4.5- 2 puffs twice a day with spacer. Rinse mouth out after to help prevent thrush. Continue Nucala 100 mg every month through [facility] May use ProAir 2 puffs every 4 hours as needed for shortness of breath, tightness in chest or shortness of breath. Also may use ProAir 2 puffs 5-15 minutes prior to exercise. Asthma control goals:   Full participation in all desired activities (may need albuterol before activity)  Albuterol use two time or less a week on average (not counting use with activity)  Cough interfering with sleep two time or less a month  Oral steroids no more than once a year  No hospitalizations  Allergic Rhinitis Continue environmental allergy injections every 4 weeks. Continue Flonase nasal spray 2 sprays each nostril once a day as needed for nasal congestion Continue Ipratropium nasal spray 1 spray per nostril up to 3 times a day as needed. Caution as this can cause over drying. Continue azelastine nasal spray- 1 spray each nostril twice a day as needed. May use saline nasal spray as needed for nasal symptoms.  Continue all other scheduled medications. Please let us know if this treatment plan is not working well for you.  Schedule follow up appointment in 6 months.  Subjective:   Caleb Evans is a 69 y.o. male presenting today for follow up of  Chief Complaint  Patient presents with  . Asthma    No problems.    Caleb Evans has a history of the following: Patient Active Problem List   Diagnosis Date Noted  . 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 Medication management 06/13/2019  . CVA (cerebral  vascular accident) ([place]) 05/29/2019  . Former smoker 05/29/2019  . Aneurysm of thoracic aorta ([place]) 11/22/2017  . Hyperlipidemia 11/22/2017  . AAA (abdominal aortic aneurysm) without rupture ([place]) 11/22/2017  . Seasonal and perennial allergic rhinitis 04/19/2017  . Severe persistent asthma, uncomplicated 69/62/9528  . GERD (gastroesophageal reflux disease) 04/08/2016  . Chronic nonseasonal allergic rhinitis due to fungal spores 01/17/2014  . Asthma-COPD overlap syndrome ([place]) 01/17/2014  . Mild chronic obstructive pulmonary disease ([place]) 01/17/2014    History obtained from: chart review and patient.  Caleb Evans is a 69 y.o. male presenting for a follow up visit.  Asthma/Respiratory Symptom History: He reports that his breathing is 70% better with the use of Symbicort 160/4.5 - 2 puffs twice a day, Nucala 100 mg injection once a month and ProAir as needed. He reports a little bit of coughing, wheezing and shortness of breath when around pollen. He sporadically  uses his ProAir. He has not required any systemic steroids and has not made any trips to the emergency room or urgent care since his last office visit.   Allergic Rhinitis Symptom History: He reports that his symptoms are modertely controlled with Flonase 2 sprays each nostril once a day, azelastine 1 spray each nostril twice a day as needed. He would like a refill on ipratropium bromide nasal spray. He reports rhinorrhea and nasal congestion and less post nasal drainage. He occasionally has occular pruritus in which he reports his eye drops help. He is currently on environmental allergy injections every 4 weeks with improvement of his symptoms. He denies any large  local reactions   He has received both Moderna vaccines without any problems.  Otherwise, there have been no changes to his past medical history, surgical history, family history, or social history.    Review of Systems  Constitutional: Negative for chills and fever.    HENT: Positive for congestion. Negative for nosebleeds.   Eyes:       Reports ocular pruritus at times  Respiratory: Positive for cough, shortness of breath and wheezing.   Cardiovascular: Negative for chest pain.  Gastrointestinal: Positive for heartburn.       Reports heartburn only if he eats after 11 pm  Skin: Negative for itching and rash.  Endo/Heme/Allergies: Positive for environmental allergies.       Objective:   Blood pressure 134/78, pulse 77, temperature 97.7 F (36.5 C), temperature source Temporal, resp. rate 18, SpO2 97 %. There is no height or weight on file to calculate BMI.   Physical Exam:  Physical Exam  Vitals reviewed. Constitutional: He is oriented to person, place, and time. He appears well-developed and well-nourished.  HENT:  Head: Normocephalic and atraumatic.  Right Ear: External ear normal.  Left Ear: External ear normal.  Nose: Nose normal.  Mouth/Throat: Oropharynx is clear and moist.  Pharynx normal. Eyes normal. Ears normal. Nose normal  Eyes: Conjunctivae are normal.  Cardiovascular: Normal rate, regular rhythm and normal heart sounds.  Respiratory: Effort normal and breath sounds normal.  Lungs clear to ausultation  Neurological: He is alert and oriented to person, place, and time.  Skin: Skin is warm.  Psychiatric: He has a normal mood and affect. His behavior is normal. Judgment and thought content normal.     Spirometry: FVC 2.65 L, FEV1 : 2.07 L. Predicted FVC 4.06 L, FEV1: 3.02 L.    Spirometry is consistent with previous spriormetry.    I performed a history and physical examination of the patient and discussed his management with the Nurse Practitioner. I reviewed the Nurse Practitioner's note and agree with the documented findings and plan of care. The note in its entirety was edited by myself, including the physical exam, assessment, and plan.      [name], MD  [facility] [place] [facility] [place]

## 2019-12-15 NOTE — Patient Instructions (Addendum)
Asthma Continue Symbicort 160/4.5- 2 puffs twice a day with spacer. Rinse mouth out after to help prevent thrush. Continue Nucala 100 mg every month through [facility] May use ProAir 2 puffs every 4 hours as needed for shortness of breath, tightness in chest or shortness of breath. Also may use ProAir 2 puffs 5-15 minutes prior to exercise. Asthma control goals:   Full participation in all desired activities (may need albuterol before activity)  Albuterol use two time or less a week on average (not counting use with activity)  Cough interfering with sleep two time or less a month  Oral steroids no more than once a year  No hospitalizations  Allergic Rhinitis Continue environmental allergy injections every 4 weeks. Continue Flonase nasal spray 2 sprays each nostril once a day as needed for nasal congestion Continue Ipratropium nasal spray 1 spray per nostril up to 3 times a day as needed. Caution as this can cause over drying. Continue azelastine nasal spray- 1 spray each nostril twice a day as needed. May use saline nasal spray as needed for nasal symptoms.  Continue all other scheduled medications. Please let us know if this treatment plan is not working well for you.  Schedule follow up appointment in 6 months.

## 2019-12-18 NOTE — Addendum Note (Signed)
Addended by: Lieutenant Diego on: 12/18/2019 01:53 PM   Modules accepted: Orders

## 2019-12-18 NOTE — Telephone Encounter (Signed)
Smoking history: former, quit 08/2018, 153 pack year

## 2019-12-26 NOTE — Progress Notes (Signed)
VIAL EXP 12-25-20

## 2019-12-27 DIAGNOSIS — J3089 Other allergic rhinitis: Secondary | ICD-10-CM | POA: Diagnosis not present

## 2019-12-27 LAB — CUP PACEART REMOTE DEVICE CHECK
Date Time Interrogation Session: 20210518230613
Implantable Pulse Generator Implant Date: 20210107

## 2020-01-01 ENCOUNTER — Ambulatory Visit (INDEPENDENT_AMBULATORY_CARE_PROVIDER_SITE_OTHER): Payer: Medicare Other | Admitting: *Deleted

## 2020-01-01 ENCOUNTER — Telehealth: Payer: Self-pay | Admitting: Pulmonary Disease

## 2020-01-01 DIAGNOSIS — I639 Cerebral infarction, unspecified: Secondary | ICD-10-CM | POA: Diagnosis not present

## 2020-01-01 NOTE — Telephone Encounter (Signed)
Nucala Order: 123m #1 Vial Order Date: 01/01/2020 Expected date of arrival: 01/02/2020 [facility]

## 2020-01-02 ENCOUNTER — Other Ambulatory Visit: Payer: Self-pay | Admitting: Allergy & Immunology

## 2020-01-02 NOTE — Telephone Encounter (Signed)
Nucala Shipment Received: 150m #1 vial Medication arrival date: 01/02/2020 Lot #: [id number] Exp date: 05/2023 Received by: LDesmond Dike [place]

## 2020-01-02 NOTE — Progress Notes (Signed)
Carelink Summary Report / Loop Recorder 

## 2020-01-02 NOTE — Telephone Encounter (Signed)
Is this a medication you would like for the patient to have?

## 2020-01-09 ENCOUNTER — Other Ambulatory Visit: Payer: Self-pay

## 2020-01-09 ENCOUNTER — Ambulatory Visit (INDEPENDENT_AMBULATORY_CARE_PROVIDER_SITE_OTHER): Payer: Medicare Other

## 2020-01-09 DIAGNOSIS — J4550 Severe persistent asthma, uncomplicated: Secondary | ICD-10-CM

## 2020-01-09 MED ORDER — MEPOLIZUMAB 100 MG ~~LOC~~ SOLR
100.0000 mg | SUBCUTANEOUS | Status: DC
Start: 2020-01-09 — End: 2021-03-21
  Administered 2020-01-09: 100 mg via SUBCUTANEOUS

## 2020-01-09 NOTE — Progress Notes (Signed)
All questions were answered by the patient before medication was administered. Have you been hospitalized in the last 10 days? No Do you have a fever? No Do you have a cough? No Do you have a headache or sore throat? No  

## 2020-01-10 ENCOUNTER — Ambulatory Visit (INDEPENDENT_AMBULATORY_CARE_PROVIDER_SITE_OTHER): Payer: Medicare Other

## 2020-01-10 DIAGNOSIS — J309 Allergic rhinitis, unspecified: Secondary | ICD-10-CM | POA: Diagnosis not present

## 2020-01-11 ENCOUNTER — Other Ambulatory Visit: Payer: Self-pay | Admitting: Family Medicine

## 2020-01-11 ENCOUNTER — Other Ambulatory Visit: Payer: Self-pay | Admitting: Allergy & Immunology

## 2020-01-11 DIAGNOSIS — I714 Abdominal aortic aneurysm, without rupture, unspecified: Secondary | ICD-10-CM

## 2020-01-11 DIAGNOSIS — F17200 Nicotine dependence, unspecified, uncomplicated: Secondary | ICD-10-CM

## 2020-01-18 ENCOUNTER — Other Ambulatory Visit: Payer: Self-pay

## 2020-01-18 ENCOUNTER — Ambulatory Visit
Admission: RE | Admit: 2020-01-18 | Discharge: 2020-01-18 | Disposition: A | Discharge status: Home / Self Care | Payer: Medicare Other | Source: Ambulatory Visit | Attending: Oncology | Admitting: Oncology

## 2020-01-18 DIAGNOSIS — Z87891 Personal history of nicotine dependence: Secondary | ICD-10-CM | POA: Diagnosis present

## 2020-01-18 DIAGNOSIS — Z122 Encounter for screening for malignant neoplasm of respiratory organs: Secondary | ICD-10-CM | POA: Insufficient documentation

## 2020-01-23 ENCOUNTER — Encounter: Payer: Self-pay | Admitting: *Deleted

## 2020-01-29 ENCOUNTER — Telehealth: Payer: Self-pay | Admitting: Pulmonary Disease

## 2020-01-29 NOTE — Telephone Encounter (Signed)
[name] #1 Vial Order Date: 01/29/20 Expected date of arrival: 01/30/20 Ordered by: [place]

## 2020-01-30 NOTE — Telephone Encounter (Signed)
Nucala Shipment Received: 166m #1 vial Medication arrival date: 01/30/2020 Lot #: [id number] Exp date: 05/2023 Received by: LDesmond Dike [place]

## 2020-02-05 ENCOUNTER — Ambulatory Visit (INDEPENDENT_AMBULATORY_CARE_PROVIDER_SITE_OTHER): Payer: Medicare Other | Admitting: *Deleted

## 2020-02-05 DIAGNOSIS — I639 Cerebral infarction, unspecified: Secondary | ICD-10-CM | POA: Diagnosis not present

## 2020-02-05 LAB — CUP PACEART REMOTE DEVICE CHECK
Date Time Interrogation Session: 20210627230537
Implantable Pulse Generator Implant Date: 20210107

## 2020-02-07 ENCOUNTER — Ambulatory Visit (INDEPENDENT_AMBULATORY_CARE_PROVIDER_SITE_OTHER): Payer: Medicare Other

## 2020-02-07 DIAGNOSIS — J309 Allergic rhinitis, unspecified: Secondary | ICD-10-CM

## 2020-02-07 NOTE — Progress Notes (Signed)
Carelink Summary Report / Loop Recorder 

## 2020-02-09 ENCOUNTER — Ambulatory Visit: Payer: Medicare Other

## 2020-02-14 ENCOUNTER — Other Ambulatory Visit: Payer: Self-pay

## 2020-02-14 ENCOUNTER — Ambulatory Visit (INDEPENDENT_AMBULATORY_CARE_PROVIDER_SITE_OTHER): Payer: Medicare Other

## 2020-02-14 DIAGNOSIS — J4550 Severe persistent asthma, uncomplicated: Secondary | ICD-10-CM | POA: Diagnosis not present

## 2020-02-14 MED ORDER — MEPOLIZUMAB 100 MG ~~LOC~~ SOLR
100.0000 mg | Freq: Once | SUBCUTANEOUS | Status: AC
  Administered 2020-02-14: 100 mg via SUBCUTANEOUS

## 2020-02-14 NOTE — Progress Notes (Signed)
Have you been hospitalized within the last 10 days?  No Do you have a fever?  No Do you have a cough?  No Do you have a headache or sore throat? No Do you have your Epi Pen visible and is it within date?  Yes 

## 2020-03-04 ENCOUNTER — Telehealth: Payer: Self-pay | Admitting: Pulmonary Disease

## 2020-03-04 NOTE — Telephone Encounter (Signed)
Nucala Order: 158m #1 Vial Order Date: 03/04/20 Expected date of arrival: 03/05/20 Ordered by: [place] BN[place]el [place]

## 2020-03-05 NOTE — Telephone Encounter (Signed)
Nucala Shipment Received: 153m #1 vial Medication arrival date: 03/05/20 Lot #: [id number] date: 06/10/2023 Received by: LE[place] Dally

## 2020-03-06 ENCOUNTER — Ambulatory Visit (INDEPENDENT_AMBULATORY_CARE_PROVIDER_SITE_OTHER): Payer: Medicare Other

## 2020-03-06 DIAGNOSIS — J309 Allergic rhinitis, unspecified: Secondary | ICD-10-CM

## 2020-03-11 ENCOUNTER — Ambulatory Visit (INDEPENDENT_AMBULATORY_CARE_PROVIDER_SITE_OTHER): Payer: Medicare Other | Admitting: *Deleted

## 2020-03-11 DIAGNOSIS — I639 Cerebral infarction, unspecified: Secondary | ICD-10-CM | POA: Diagnosis not present

## 2020-03-12 LAB — CUP PACEART REMOTE DEVICE CHECK
Date Time Interrogation Session: 20210730230344
Implantable Pulse Generator Implant Date: 20210107

## 2020-03-13 ENCOUNTER — Other Ambulatory Visit: Payer: Self-pay

## 2020-03-13 ENCOUNTER — Ambulatory Visit (INDEPENDENT_AMBULATORY_CARE_PROVIDER_SITE_OTHER): Payer: Medicare Other

## 2020-03-13 DIAGNOSIS — J4550 Severe persistent asthma, uncomplicated: Secondary | ICD-10-CM | POA: Diagnosis not present

## 2020-03-13 MED ORDER — MEPOLIZUMAB 100 MG ~~LOC~~ SOLR
100.0000 mg | SUBCUTANEOUS | Status: DC
Start: 2020-03-13 — End: 2021-03-21
  Administered 2020-03-13: 100 mg via SUBCUTANEOUS

## 2020-03-13 NOTE — Progress Notes (Signed)
All questions were answered by the patient before medication was administered. Have you been hospitalized in the last 10 days? No Do you have a fever? No Do you have a cough? No Do you have a headache or sore throat? No  

## 2020-03-13 NOTE — Progress Notes (Signed)
Carelink Summary Report / Loop Recorder 

## 2020-04-03 ENCOUNTER — Ambulatory Visit (INDEPENDENT_AMBULATORY_CARE_PROVIDER_SITE_OTHER): Payer: Medicare Other

## 2020-04-03 ENCOUNTER — Telehealth: Payer: Self-pay | Admitting: Pulmonary Disease

## 2020-04-03 DIAGNOSIS — J309 Allergic rhinitis, unspecified: Secondary | ICD-10-CM

## 2020-04-03 NOTE — Telephone Encounter (Signed)
Nucala Shipment Received: 131m #1 vial Medication arrival date: 04/03/20 Lot #: [id number] Exp date: 10/08/2023 Received by: LElliot Dally

## 2020-04-09 ENCOUNTER — Telehealth: Payer: Self-pay | Admitting: Pulmonary Disease

## 2020-04-09 NOTE — Telephone Encounter (Signed)
Patient scheduled 04/11/20 at 1430 with Brian,NP.  Nucala injection can be given during that time.  Mariana Single to let Brian,NP know once he is finished, and I can bring Nucala injection to room.

## 2020-04-09 NOTE — Telephone Encounter (Signed)
ATC Manuela Schwartz.  LM to call back.  Patient is scheduled with Brian,NP 9/2, at 2:30pm.  Nucala injection can be done during OV. Will try to contact Manuela Schwartz and Patient later today.

## 2020-04-09 NOTE — Telephone Encounter (Signed)
Manuela Schwartz returning a phone call. Manuela Schwartz can be reached at [phone]

## 2020-04-10 ENCOUNTER — Ambulatory Visit: Payer: Medicare Other

## 2020-04-11 ENCOUNTER — Other Ambulatory Visit: Payer: Self-pay

## 2020-04-11 ENCOUNTER — Encounter: Payer: Self-pay | Admitting: Pulmonary Disease

## 2020-04-11 ENCOUNTER — Ambulatory Visit (INDEPENDENT_AMBULATORY_CARE_PROVIDER_SITE_OTHER): Payer: Medicare Other | Admitting: Pulmonary Disease

## 2020-04-11 ENCOUNTER — Ambulatory Visit: Payer: Medicare Other | Admitting: Pulmonary Disease

## 2020-04-11 ENCOUNTER — Ambulatory Visit (INDEPENDENT_AMBULATORY_CARE_PROVIDER_SITE_OTHER): Payer: Medicare Other | Admitting: *Deleted

## 2020-04-11 ENCOUNTER — Ambulatory Visit: Payer: Medicare Other

## 2020-04-11 VITALS — BP 124/78 | HR 75 | Temp 97.4°F | Ht 68.0 in | Wt 181.8 lb

## 2020-04-11 DIAGNOSIS — J449 Chronic obstructive pulmonary disease, unspecified: Secondary | ICD-10-CM | POA: Diagnosis not present

## 2020-04-11 DIAGNOSIS — J302 Other seasonal allergic rhinitis: Secondary | ICD-10-CM

## 2020-04-11 DIAGNOSIS — J3089 Other allergic rhinitis: Secondary | ICD-10-CM | POA: Diagnosis not present

## 2020-04-11 DIAGNOSIS — I639 Cerebral infarction, unspecified: Secondary | ICD-10-CM

## 2020-04-11 DIAGNOSIS — J4550 Severe persistent asthma, uncomplicated: Secondary | ICD-10-CM

## 2020-04-11 LAB — CUP PACEART REMOTE DEVICE CHECK
Date Time Interrogation Session: 20210901230557
Implantable Pulse Generator Implant Date: 20210107

## 2020-04-11 MED ORDER — MEPOLIZUMAB 100 MG ~~LOC~~ SOLR
100.0000 mg | Freq: Once | SUBCUTANEOUS | Status: AC
  Administered 2020-04-11: 100 mg via SUBCUTANEOUS

## 2020-04-11 NOTE — Patient Instructions (Addendum)
You were seen today by Lauraine Rinne, NP  for:   1. Asthma-COPD overlap syndrome (HCC)  Continue Nucala injections  Continue immunotherapy with allergy asthma  Continue Symbicort 160 >>> 2 puffs in the morning right when you wake up, rinse out your mouth after use, 12 hours later 2 puffs, rinse after use >>> Take this daily, no matter what >>> This is not a rescue inhaler   Only use your albuterol as a rescue medication to be used if you can't catch your breath by resting or doing a relaxed purse lip breathing pattern.  - The less you use it, the better it will work when you need it. - Ok to use up to 2 puffs  every 4 hours if you must but call for immediate appointment if use goes up over your usual need - Don't leave home without it !!  (think of it like the spare tire for your car)   We will work to get you established with Dr. Valeta Harms in our office per your request   2. Seasonal and perennial allergic rhinitis  Continue medications as outlined by Dr. Ernst Bowler with allergy asthma  Continue immunotherapy as outlined by Dr. Ernst Bowler  Please schedule a follow-up visit with Dr. Ernst Bowler regarding your medication questions as well as persistent postnasal drip  Start nasal saline rinses twice daily Use distilled water Shake well Get bottle lukewarm like a baby bottle  3. Former smoker - Quit [employer] job on stopping smoking    Follow Up:    Return in about 3 months (around 07/11/2020), or if symptoms worsen or fail to improve, for Follow up with Dr. Valeta Harms, [place].  MUST BE ICARD - new pt to ICARD        Please do your part to reduce the spread of COVID-19:      Reduce your risk of any infection  and COVID19 by using the similar precautions used for avoiding the common cold or flu:  Marland Kitchen Wash your hands often with soap and warm water for at least 20 seconds.  If soap and water are not readily available, use an alcohol-based hand sanitizer with at least 60%  alcohol.  . If coughing or sneezing, cover your mouth and nose by coughing or sneezing into the elbow areas of your shirt or coat, into a tissue or into your sleeve (not your hands). Langley Gauss A MASK when in public  . Avoid shaking hands with others and consider head nods or verbal greetings only. . Avoid touching your eyes, nose, or mouth with unwashed hands.  . Avoid close contact with people who are sick. . Avoid places or events with large numbers of people in one location, like concerts or sporting events. . If you have some symptoms but not all symptoms, continue to monitor at home and seek medical attention if your symptoms worsen. . If you are having a medical emergency, call 911.   [place] / e-Visit: [email or web address]         [facility]: [facility]: [phone]                   [facility]: [phone]     It is flu season:   >>> Best ways to protect herself from the flu: Receive the yearly flu vaccine, practice good hand hygiene washing with soap and also using hand sanitizer when  available, eat a nutritious meals, get adequate rest, hydrate appropriately   Please contact the office if your symptoms worsen or you have concerns that you are not improving.   Thank you for choosing [facility] for your healthcare, and for allowing [place] to partner with you on your healthcare journey. I am thankful to be able to provide care to you today.   Wyn Quaker FNP-C

## 2020-04-11 NOTE — Assessment & Plan Note (Signed)
Plan: Continue immunotherapy as outlined by allergy asthma Continue nasal medications as managed by Dr. Ernst Bowler Strongly recommend nasal saline rinses

## 2020-04-11 NOTE — Assessment & Plan Note (Signed)
Likely asthma COPD overlap syndrome 150-pack-year smoking history Managed on Nucala Also receiving allergy injections from Dr. Gillermina Hu office Persistent rhinorrhea Postnasal drip  Plan: Continue Symbicort 160 We will work to get you established with Dr. Valeta Harms in our office in a 30-minute slot Continue Nucala

## 2020-04-11 NOTE — Progress Notes (Signed)
@Patient  ID: Caleb Evans, male    DOB: 01/03/1951, 69 y.o.   MRN: [id number]  Chief Complaint  Patient presents with  . Follow-up    pt is coughing up green mucus    Referring provider: Theotis Burrow*  HPI:  69 year old male former smoker followed in our office for asthma COPD overlap syndrome.  Patient also is managed by allergy asthma Dr. Ernst Bowler. 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  PMH: GERD, history of CVA, history of AAA, hyperlipidemia, allergic rhinitis Smoker/ Smoking History: Former smoker.  Quit 2019.  150-pack-year smoking history. Maintenance: Symbicort 160, Nucala Pt of: Dr. Vaughan Browner, interested in establishing with Dr. Valeta Harms   04/11/2020  - Visit   69 year old male former smoker followed in our office for asthma COPD overlap syndrome.  He is also seen jointly by allergy asthma Dr. Ernst Bowler.  Patient is maintained on Nucala.  Patient reports adherence to Symbicort 160.  He reports he is having thick green mucus for the last 3 to 4 weeks.  Ever since the "crpe myrtles opened up".  He also is having significant nasal drainage.  He reports adherence to Flonase and Astelin nasal spray.  He is nonadherent to nasal saline rinses.  We will discuss and evaluate this today.  He is planning on receiving a Nucala injection today.  He was planning on establishing with Dr. Valeta Harms.  This has not yet been completed.  Patient continues to receive immunotherapy from allergy asthma.  No persistent fevers.  No recent antibiotic use.  No recent steroid use.  Tests:   Imaging CXR 03/25/2016 Abnormal markings at the lung bases with slight volume loss. This could represent chronic scarring/ fibrosis or mild bibasilar pneumonia.  CT scan 04/22/16 No evidence of interstitial lung disease, mild bronchiectasis bases, bronchial wall thickening, mild emphysema. Images  reviewed.  Spirometry 04/08/16 FVC 2.68 [6%) FEV1 2.03 [72%), post bronchodilator 2.30 [82%], + 13% F/F 75 Minimal obstruction, positive bronchial dilator response  Allergy testing: Positive for trees, weeds, mold, dust mite.  FENO 04/09/16- 80 FENO 04/21/2018-43  Labs 04/29/16 Aspergillus ab - negative IgE- 81 CBC with diff- WBC count 12.3, absolute eosinophil count 1242/microL   FENO:  Lab Results  Component Value Date   NITRICOXIDE 43 04/21/2018    PFT: PFT Results Latest Ref Rng & Units 04/27/2018  FVC-Pre L 3.32  FVC-Predicted Pre % 81  FVC-Post L 3.47  FVC-Predicted Post % 84  Pre FEV1/FVC % % 70  Post FEV1/FCV % % 72  FEV1-Pre L 2.32  FEV1-Predicted Pre % 75  FEV1-Post L 2.50  DLCO uncorrected ml/min/mmHg 20.09  DLCO UNC% % 70  DLVA Predicted % 84  TLC L 5.80  TLC % Predicted % 90  RV % Predicted % 107    WALK:  SIX MIN WALK 06/13/2019  Supplimental Oxygen during Test? (L/min) No  Tech Comments: ambulated at steady pace    Imaging: No results found.  Lab Results:  CBC    Component Value Date/Time   WBC 9.5 06/13/2019 1652   RBC 4.72 06/13/2019 1652   HGB 14.6 06/13/2019 1652   HCT 44.0 06/13/2019 1652   PLT 388.0 06/13/2019 1652   MCV 93.2 06/13/2019 1652   MCH 30.8 05/29/2019 0456   MCHC 33.2 06/13/2019 1652   RDW 14.4 06/13/2019 1652   LYMPHSABS 2.9 06/13/2019 1652   MONOABS 0.6 06/13/2019 1652  EOSABS 0.1 06/13/2019 1652   BASOSABS 0.1 06/13/2019 1652    BMET    Component Value Date/Time   NA 139 05/30/2019 0602   K 3.7 05/30/2019 0602   CL 108 05/30/2019 0602   CO2 23 05/30/2019 0602   GLUCOSE 105 (H) 05/30/2019 0602   BUN 10 05/30/2019 0602   CREATININE 0.84 05/30/2019 0602   CALCIUM 8.6 (L) 05/30/2019 0602   GFRNONAA >60 05/30/2019 0602   GFRAA >60 05/30/2019 0602    BNP No results found for: BNP  ProBNP No results found for: PROBNP  Specialty Problems      Pulmonary Problems   Asthma-COPD overlap syndrome (HCC)    Chronic nonseasonal allergic rhinitis due to fungal spores   Mild chronic obstructive pulmonary disease (HCC)   Severe persistent asthma, uncomplicated   Seasonal and perennial allergic rhinitis      Allergies  Allergen Reactions  . Chlorpheniramine Other (See Comments)    Sinus congestion  . Amoxicillin-Pot Clavulanate Other (See Comments)    UNSPECIFIED REACTION  Has patient had a PCN reaction causing immediate rash, facial/tongue/throat swelling, SOB or lightheadedness with hypotension: No Has patient had a PCN reaction causing severe rash involving mucus membranes or skin necrosis: No Has patient had a PCN reaction that required hospitalization: No Has patient had a PCN reaction occurring within the last 10 years: Yes If all of the above answers are "NO", then may proceed with Cephalosporin use.  Caused liver failure   . Lisinopril Cough    Immunization History  Administered Date(s) Administered  . Influenza Split 05/11/2015  . Influenza, High Dose Seasonal PF 05/12/2019  . Moderna SARS-COVID-2 Vaccination 10/20/2019, 11/21/2019    Past Medical History:  Diagnosis Date  . AAA (abdominal aortic aneurysm) ([place])   . Adrenal nodule ([place])   . Aneurysm of thoracic aorta ([place]) 11/22/2017  . Asthma   . Asthma-COPD overlap syndrome ([place]) 01/17/2014  . Bladder mass   . Bladder neoplasm   . Cancer [facility])    bladder cancer  . COPD (chronic obstructive pulmonary disease) ([place])   . Crohn's disease ([place])   . CVA (cerebral vascular accident) ([place]) 05/29/2019  . Diverticulitis   . Former smoker 05/29/2019  . GERD (gastroesophageal reflux disease)    takes Nexium off and on  . Hyperlipidemia 11/22/2017  . Over weight   . Seasonal and perennial allergic rhinitis 04/19/2017  . Skin lesion   . Thoracic aortic aneurysm ([place])   . Weak urinary stream     Tobacco History: Social History   Tobacco Use  Smoking Status Former Smoker  . Packs/day: 3.00  . Years: 50.00  . Pack  years: 150.00  . Types: Cigarettes  . Quit date: 04/10/2018  . Years since quitting: 2.0  Smokeless Tobacco Never Used   Counseling given: Yes   Continue to not smoke  Outpatient Encounter Medications as of 04/11/2020  Medication Sig  . aspirin 81 MG chewable tablet Chew 1 tablet (81 mg total) by mouth daily.  Marland Kitchen atorvastatin (LIPITOR) 80 MG tablet Take 80 mg by mouth at bedtime.  Marland Kitchen azelastine (ASTELIN) 0.1 % nasal spray PLACE 2 SPRAYS IN BOTH NOSTRILS 2 TIMES DAILY.  . benzonatate (TESSALON) 100 MG capsule TAKE 1 CAPSULE THREE TIMES A DAY AS NEEDED FOR COUGH.  . budesonide-formoterol (SYMBICORT) 160-4.5 MCG/ACT inhaler Inhale 2 puffs into the lungs 2 (two) times daily. Uses as needed.  . Calcium Carb-Cholecalciferol (CALCIUM 600+D3 PO) Take 1 tablet by mouth daily.  Marland Kitchen  fluticasone (FLONASE) 50 MCG/ACT nasal spray Place 2 sprays into both nostrils daily.  Marland Kitchen ipratropium-albuterol (DUONEB) 0.5-2.5 (3) MG/3ML SOLN Take 3 mLs by nebulization every 4 (four) hours as needed.  Marland Kitchen losartan (COZAAR) 25 MG tablet Take 25 mg by mouth daily.  . Mepolizumab (NUCALA) 100 MG/ML SOAJ Inject into the skin.  . Multiple Vitamin (MULTIVITAMIN WITH MINERALS) TABS tablet Take 1 tablet by mouth daily.  Marland Kitchen PROAIR HFA 108 (90 Base) MCG/ACT inhaler INHALE 2 PUFFS EVERY 4 TO 6 HOURS AS NEEDED.   Facility-Administered Encounter Medications as of 04/11/2020  Medication  . clindamycin (CLEOCIN) 900 mg in dextrose 5 % 50 mL IVPB   And  . gentamicin (GARAMYCIN) 390 mg in dextrose 5 % 50 mL IVPB  . Mepolizumab SOLR 100 mg  . Mepolizumab SOLR 100 mg  . Mepolizumab SOLR 100 mg  . Mepolizumab SOLR 100 mg  . Mepolizumab SOLR 100 mg  . Mepolizumab SOLR 100 mg  . Mepolizumab SOLR 100 mg  . Mepolizumab SOLR 100 mg  . Mepolizumab SOLR 100 mg  . Mepolizumab SOLR 100 mg  . Mepolizumab SOLR 100 mg  . Mepolizumab SOLR 100 mg  . Mepolizumab SOLR 100 mg     Review of Systems  Review of Systems  Constitutional: Positive for  fatigue. Negative for activity change, chills, fever and unexpected weight change.  HENT: Positive for congestion, sinus pressure and sinus pain. Negative for postnasal drip, rhinorrhea and sore throat.   Eyes: Negative.   Respiratory: Positive for shortness of breath and wheezing. Negative for cough.   Cardiovascular: Negative for chest pain and palpitations.  Gastrointestinal: Negative for constipation, diarrhea, nausea and vomiting.  Endocrine: Negative.   Genitourinary: Negative.   Musculoskeletal: Negative.   Skin: Negative.   Neurological: Negative for dizziness and headaches.  Psychiatric/Behavioral: Negative.  Negative for dysphoric mood. The patient is not nervous/anxious.   All other systems reviewed and are negative.    Physical Exam  BP 124/78 (BP Location: Left Arm, Cuff Size: Normal)   Pulse 75   Temp (!) 97.4 F (36.3 C) (Oral)   Ht 5' 8"  (1.727 m)   Wt 181 lb 12.8 oz (82.5 kg)   SpO2 96%   BMI 27.64 kg/m   Wt Readings from Last 5 Encounters:  04/11/20 181 lb 12.8 oz (82.5 kg)  01/18/20 178 lb (80.7 kg)  10/25/19 180 lb (81.6 kg)  10/20/19 180 lb (81.6 kg)  08/17/19 186 lb (84.4 kg)    BMI Readings from Last 5 Encounters:  04/11/20 27.64 kg/m  01/18/20 27.06 kg/m  10/25/19 28.19 kg/m  10/20/19 28.19 kg/m  08/17/19 29.13 kg/m     Physical Exam Vitals and nursing note reviewed.  Constitutional:      General: He is not in acute distress.    Appearance: Normal appearance. He is normal weight.  HENT:     Head: Normocephalic and atraumatic.     Right Ear: Hearing, tympanic membrane, ear canal and external ear normal. There is no impacted cerumen.     Left Ear: Hearing, tympanic membrane, ear canal and external ear normal. There is no impacted cerumen.     Nose: Congestion (green) and rhinorrhea present. No mucosal edema.     Right Turbinates: Not enlarged.     Left Turbinates: Not enlarged.     Mouth/Throat:     Mouth: Mucous membranes are dry.       Pharynx: Oropharynx is clear. No oropharyngeal exudate.  Eyes:  Pupils: Pupils are equal, round, and reactive to light.  Cardiovascular:     Rate and Rhythm: Normal rate and regular rhythm.     Pulses: Normal pulses.     Heart sounds: Normal heart sounds. No murmur heard.   Pulmonary:     Effort: Pulmonary effort is normal.     Breath sounds: Normal breath sounds. No decreased breath sounds, wheezing or rales.  Musculoskeletal:     Cervical back: Normal range of motion.     Right lower leg: No edema.     Left lower leg: No edema.  Lymphadenopathy:     Cervical: No cervical adenopathy.  Skin:    General: Skin is warm and dry.     Capillary Refill: Capillary refill takes less than 2 seconds.     Findings: No erythema or rash.  Neurological:     General: No focal deficit present.     Mental Status: He is alert and oriented to person, place, and time.     Motor: No weakness.     Coordination: Coordination normal.     Gait: Gait is intact. Gait normal.  Psychiatric:        Mood and Affect: Mood normal.        Behavior: Behavior normal. Behavior is cooperative.        Thought Content: Thought content normal.        Judgment: Judgment normal.       Assessment & Plan:   Seasonal and perennial allergic rhinitis Plan: Continue immunotherapy as outlined by allergy asthma Continue nasal medications as managed by Dr. Ernst Bowler Strongly recommend nasal saline rinses  Asthma-COPD overlap syndrome ([place]) Likely asthma COPD overlap syndrome 150-pack-year smoking history Managed on Nucala Also receiving allergy injections from Dr. Gillermina Hu office Persistent rhinorrhea Postnasal drip  Plan: Continue Symbicort 160 We will work to get you established with Dr. Valeta Harms in our office in a 30-minute slot Continue Nucala    Return in about 3 months (around 07/11/2020), or if symptoms worsen or fail to improve, for Follow up with Dr. Valeta Harms, [place].   Lauraine Rinne,  NP 04/11/2020   This appointment required 42 minutes of patient care (this includes precharting, chart review, review of results, face-to-face care, etc.).

## 2020-04-11 NOTE — Addendum Note (Signed)
Addended by: Elton Sin on: 04/11/2020 03:24 PM   Modules accepted: Orders

## 2020-04-11 NOTE — Progress Notes (Signed)
Have you been hospitalized within the last 10 days?  No Do you have a fever?  No Do you have a cough?  No Do you have a headache or sore throat? No Do you have your Epi Pen visible and is it within date?  Yes 

## 2020-04-16 NOTE — Progress Notes (Signed)
Carelink Summary Report / Loop Recorder 

## 2020-04-17 ENCOUNTER — Ambulatory Visit (INDEPENDENT_AMBULATORY_CARE_PROVIDER_SITE_OTHER): Payer: Medicare Other

## 2020-04-17 DIAGNOSIS — J309 Allergic rhinitis, unspecified: Secondary | ICD-10-CM | POA: Diagnosis not present

## 2020-04-23 ENCOUNTER — Ambulatory Visit (INDEPENDENT_AMBULATORY_CARE_PROVIDER_SITE_OTHER): Payer: Medicare Other | Admitting: Neurology

## 2020-04-23 ENCOUNTER — Other Ambulatory Visit: Payer: Self-pay

## 2020-04-23 VITALS — BP 137/91 | HR 82 | Ht 67.0 in | Wt 182.6 lb

## 2020-04-23 DIAGNOSIS — I639 Cerebral infarction, unspecified: Secondary | ICD-10-CM

## 2020-04-23 DIAGNOSIS — E782 Mixed hyperlipidemia: Secondary | ICD-10-CM

## 2020-04-23 DIAGNOSIS — I10 Essential (primary) hypertension: Secondary | ICD-10-CM

## 2020-04-23 NOTE — Progress Notes (Signed)
NEUROLOGY FOLLOW UP OFFICE NOTE  Caleb Evans [id number]  HISTORY OF PRESENT ILLNESS: Caleb Evans is a 36 year oldright-handed Caucasian male with COPD, Crohn's disease, AAA, and history of bladder cancer who follows up for stroke.  UPDATE: Current medications:  ASA 31m; Lipitor 832m losartan  He is doing well.  Status post implantable loop recorder.  No arrhythmia thus far.  Blood pressure systolic ranges 12867-544   HISTORY: [facility] for acute onset of left sided weakness and unsteady gait. The previous day, he noted feeling unsteady on his feet. The next morning, he noted left upper extremity and left lower extremity weakness. Initially, he noted some numbness as well.CT head showed chronic small vessel ischemic changes and remote right basal ganglia lacunar infarct but no acute intracranial abnormality. He did not receive tPA as he was outside the therapeutic window. CTof brain showed mild chronic small vessel disease and remote small lacunar infarcts involving the right putamen and left centrum semiovale but no acute intracranial abnormalities. He was unable to get an MRI due to metal in his eye.CTA of head and neck showed no intracranial or extracranial emergent large vessel stenosis or occlusion. Carotid doppler showed no hemodynamically significant stenosis. 2D echocardiogram showed EF 55-60% with no cardiac source of embolus. LDL was 50 with TG 259. Hgb A1c was 6.1. HIV and coronovirus testing were negative. EEG was normal. He was continued on Lipitor 8072maily and started on ASA 54m60mily.   PAST MEDICAL HISTORY: Past Medical History:  Diagnosis Date  . AAA (abdominal aortic aneurysm) (HCC)[place]. Adrenal nodule (HCC)[place]. Aneurysm of thoracic aorta (HCC)[place]15/2019  . Asthma   . 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 Bladder mass   . Bladder neoplasm   [facility]  .  COPD (chronic obstructive pulmonary disease) (HCC)[place]. Crohn's disease (HCC)[place]. CVA (cerebral vascular accident) (HCC)[place]/19/2020  . Diverticulitis   . Former smoker 05/29/2019  . GERD (gastroesophageal reflux disease)    takes Nexium off and on  . Hyperlipidemia 11/22/2017  . Over weight   . Seasonal and perennial allergic rhinitis 04/19/2017  . Skin lesion   . Thoracic aortic aneurysm (HCC)[place]. Weak urinary stream     MEDICATIONS: Current Outpatient Medications on File Prior to Visit  Medication Sig Dispense Refill  . aspirin 81 MG chewable tablet Chew 1 tablet (81 mg total) by mouth daily.    . atMarland Kitchenrvastatin (LIPITOR) 80 MG tablet Take 80 mg by mouth at bedtime.    . azMarland Kitchenlastine (ASTELIN) 0.1 % nasal spray PLACE 2 SPRAYS IN BOTH NOSTRILS 2 TIMES DAILY. 30 mL 6  . benzonatate (TESSALON) 100 MG capsule TAKE 1 CAPSULE THREE TIMES A DAY AS NEEDED FOR COUGH. 20 capsule 0  . budesonide-formoterol (SYMBICORT) 160-4.5 MCG/ACT inhaler Inhale 2 puffs into the lungs 2 (two) times daily. Uses as needed.    . Calcium Carb-Cholecalciferol (CALCIUM 600+D3 PO) Take 1 tablet by mouth daily.    . fluticasone (FLONASE) 50 MCG/ACT nasal spray Place 2 sprays into both nostrils daily.    . ipMarland Kitchenatropium-albuterol (DUONEB) 0.5-2.5 (3) MG/3ML SOLN Take 3 mLs by nebulization every 4 (four) hours as needed.    . loMarland Kitchenartan (COZAAR) 25 MG tablet Take 25 mg by mouth daily.    . Mepolizumab (NUCALA) 100 MG/ML SOAJ Inject into the skin.    .Marland Kitchen  Multiple Vitamin (MULTIVITAMIN WITH MINERALS) TABS tablet Take 1 tablet by mouth daily.    Marland Kitchen PROAIR HFA 108 (90 Base) MCG/ACT inhaler INHALE 2 PUFFS EVERY 4 TO 6 HOURS AS NEEDED. 18 g 1   Current Facility-Administered Medications on File Prior to Visit  Medication Dose Route Frequency Provider Last Rate Last Admin  . clindamycin (CLEOCIN) 900 mg in dextrose 5 % 50 mL IVPB  900 mg Intravenous 60 min Pre-Op Stark Klein, MD       And  . gentamicin (GARAMYCIN) 390 mg in dextrose  5 % 50 mL IVPB  5 mg/kg Intravenous 60 min Pre-Op Stark Klein, MD      . Mepolizumab SOLR 100 mg  100 mg Subcutaneous Q28 days Mannam, Praveen, MD   100 mg at 06/21/18 0809  . Mepolizumab SOLR 100 mg  100 mg Subcutaneous Q28 days Mannam, Praveen, MD   100 mg at 09/23/18 1621  . Mepolizumab SOLR 100 mg  100 mg Subcutaneous Q28 days Mannam, Praveen, MD   100 mg at 12/26/18 1411  . Mepolizumab SOLR 100 mg  100 mg Subcutaneous Q28 days Mannam, Praveen, MD   100 mg at 01/23/19 1405  . Mepolizumab SOLR 100 mg  100 mg Subcutaneous Q28 days Mannam, Praveen, MD   100 mg at 02/23/19 1347  . Mepolizumab SOLR 100 mg  100 mg Subcutaneous Q28 days Mannam, Praveen, MD   100 mg at 03/27/19 1359  . Mepolizumab SOLR 100 mg  100 mg Subcutaneous Q28 days Mannam, Praveen, MD   100 mg at 04/27/19 1413  . Mepolizumab SOLR 100 mg  100 mg Subcutaneous Q28 days Mannam, Praveen, MD   100 mg at 07/24/19 1408  . Mepolizumab SOLR 100 mg  100 mg Subcutaneous Q28 days Mannam, Praveen, MD   100 mg at 08/24/19 1337  . Mepolizumab SOLR 100 mg  100 mg Subcutaneous Q28 days Mannam, Praveen, MD   100 mg at 09/21/19 1421  . Mepolizumab SOLR 100 mg  100 mg Subcutaneous Q28 days Mannam, Praveen, MD   100 mg at 12/01/19 1637  . Mepolizumab SOLR 100 mg  100 mg Subcutaneous Q28 days Mannam, Praveen, MD   100 mg at 01/09/20 1314  . Mepolizumab SOLR 100 mg  100 mg Subcutaneous Q28 days Mannam, Praveen, MD   100 mg at 03/13/20 1349    ALLERGIES: Allergies  Allergen Reactions  . Chlorpheniramine Other (See Comments)    Sinus congestion  . Amoxicillin-Pot Clavulanate Other (See Comments)    UNSPECIFIED REACTION  Has patient had a PCN reaction causing immediate rash, facial/tongue/throat swelling, SOB or lightheadedness with hypotension: No Has patient had a PCN reaction causing severe rash involving mucus membranes or skin necrosis: No Has patient had a PCN reaction that required hospitalization: No Has patient had a PCN reaction  occurring within the last 10 years: Yes If all of the above answers are "NO", then may proceed with Cephalosporin use.  Caused liver failure   . Lisinopril Cough    FAMILY HISTORY: Family History  Adopted: Yes  Problem Relation Age of Onset  . Healthy Son   . Healthy Daughter   . Bladder Cancer Neg Hx   . Kidney cancer Neg Hx   . Prostate cancer Neg Hx   . Allergic rhinitis Neg Hx   . Angioedema Neg Hx   . Asthma Neg Hx   . Eczema Neg Hx   . Urticaria Neg Hx   . Immunodeficiency Neg Hx   . Atopy  Neg Hx     SOCIAL HISTORY: Social History   Socioeconomic History  . Marital status: Married    Spouse name: Not on file  . Number of children: 2  . Years of education: Not on file  . Highest education level: Associate degree: occupational, Hotel manager, or vocational program  Occupational History  . Not on file  Tobacco Use  . Smoking status: Former Smoker    Packs/day: 3.00    Years: 50.00    Pack years: 150.00    Types: Cigarettes    Quit date: 04/10/2018    Years since quitting: 2.0  . Smokeless tobacco: Never Used  Vaping Use  . Vaping Use: Never used  Substance and Sexual Activity  . Alcohol use: Yes    Alcohol/week: 0.0 standard drinks    Comment: occasional  . Drug use: No  . Sexual activity: Not on file  Other Topics Concern  . Not on file  Social History Narrative   Patient is adopted   Married, lives with spouse   2 children (boy and girl)   OCCUPATION: Dealer x54yr.  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 Difficulty of Paying Living Expenses: Not on file  Food Insecurity:   . Worried About RCharity fundraiserin the Last Year: Not on file  . Ran Out of Food in the Last Year: Not on file  Transportation Needs:   . Lack of Transportation (Medical): Not on  file  . Lack of Transportation (Non-Medical): Not on file  Physical Activity:   . Days of Exercise per Week: Not on file  . Minutes of Exercise per Session: Not on file  Stress:   . Feeling of Stress : Not on file  Social Connections:   . Frequency of Communication with Friends and Family: Not on file  . Frequency of Social Gatherings with Friends and Family: Not on file  . Attends Religious Services: Not on file  . Active Member of Clubs or Organizations: Not on file  . Attends CArchivistMeetings: Not on file  . Marital Status: Not on file  Intimate Partner Violence:   . Fear of Current or Ex-Partner: Not on file  . Emotionally Abused: Not on file  . Physically Abused: Not on file  . Sexually Abused: Not on file    PHYSICAL EXAM: Blood pressure (!) 137/91, pulse 82, height 5' 7"  (1.702 m), weight 182 lb 9.6 oz (82.8 kg), SpO2 97 %. General: No acute distress.  Patient appears well-groomed.   Head:  Normocephalic/atraumatic Eyes:  Fundi examined but not visualized Neck: supple, no paraspinal tenderness, full range of motion Heart:  Regular rate and rhythm Lungs:  Clear to auscultation bilaterally Back: No paraspinal tenderness Neurological Exam: alert and oriented to person, place, and time. Attention span and concentration intact, recent and remote memory intact, fund of knowledge intact.  Speech fluent and not dysarthric, language intact.  CN II-XII intact. Bulk and tone normal, muscle strength 5/5 throughout.  Sensation to light touch, temperature and vibration intact.  Deep tendon reflexes 2+ throughout, toes downgoing.  Finger to nose and heel to shin testing intact.  Gait normal, Romberg negative.  IMPRESSION: 1.  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  Hyperlipidemia 3.  Essential hypertension  PLAN: 1.  Secondary stroke prevention as managed by PCP:  - ASA 39m daily  - Lipitor 866mdaily (LDL goal less than 70)  -  Blood pressure control  - Glycemic control (Hgb A1c goal less than 7)  -  Mediterranean diet  -  Routine exercise 2.  Follow up as needed.  AdMetta ClinesDO  CC:  AdTheotis BurrowMD

## 2020-04-23 NOTE — Patient Instructions (Signed)
1.  Continue aspirin 69m daily, atorvastatin 820mdaily, losartan 2.  Mediterranean diet (see below) 3.  Routine exercise 4.  Follow up as needed.   Mediterranean Diet A Mediterranean diet refers to food and lifestyle choices that are based on the traditions of countries located on the MeThe [employer] of CompaniesThis way of eating has been shown to help prevent certain conditions and improve outcomes for people who have chronic diseases, like kidney disease and heart disease. What are tips for following this plan? Lifestyle  Cook and eat meals together with your family, when possible.  Drink enough fluid to keep your urine clear or pale yellow.  Be physically active every day. This includes: ? Aerobic exercise like running or swimming. ? Leisure activities like gardening, walking, or housework.  Get 7-8 hours of sleep each night.  If recommended by your health care provider, drink red wine in moderation. This means 1 glass a day for nonpregnant women and 2 glasses a day for men. A glass of wine equals 5 oz (150 mL). Reading food labels   Check the serving size of packaged foods. For foods such as rice and pasta, the serving size refers to the amount of cooked product, not dry.  Check the total fat in packaged foods. Avoid foods that have saturated fat or trans fats.  Check the ingredients list for added sugars, such as corn syrup. Shopping  At the grocery store, buy most of your food from the areas near the walls of the store. This includes: ? Fresh fruits and vegetables (produce). ? Grains, beans, nuts, and seeds. Some of these may be available in unpackaged forms or large amounts (in bulk). ? Fresh seafood. ? Poultry and eggs. ? Low-fat dairy products.  Buy whole ingredients instead of prepackaged foods.  Buy fresh fruits and vegetables in-season from local farmers markets.  Buy frozen fruits and vegetables in resealable bags.  If you do not have access to quality fresh  seafood, buy precooked frozen shrimp or canned fish, such as tuna, salmon, or sardines.  Buy small amounts of raw or cooked vegetables, salads, or olives from the deli or salad bar at your store.  Stock your pantry so you always have certain foods on hand, such as olive oil, canned tuna, canned tomatoes, rice, pasta, and beans. Cooking  Cook foods with extra-virgin olive oil instead of using butter or other vegetable oils.  Have meat as a side dish, and have vegetables or grains as your main dish. This means having meat in small portions or adding small amounts of meat to foods like pasta or stew.  Use beans or vegetables instead of meat in common dishes like chili or lasagna.  Experiment with different cooking methods. Try roasting or broiling vegetables instead of steaming or sauteing them.  Add frozen vegetables to soups, stews, pasta, or rice.  Add nuts or seeds for added healthy fat at each meal. You can add these to yogurt, salads, or vegetable dishes.  Marinate fish or vegetables using olive oil, lemon juice, garlic, and fresh herbs. Meal planning   Plan to eat 1 vegetarian meal one day each week. Try to work up to 2 vegetarian meals, if possible.  Eat seafood 2 or more times a week.  Have healthy snacks readily available, such as: ? Vegetable sticks with hummus. ? Gr[place]ogurt. ? Fruit and nut trail mix.  Eat balanced meals throughout the week. This includes: ? Fruit: 2-3 servings a day ? Vegetables: 4-5 servings a  day ? Low-fat dairy: 2 servings a day ? Fish, poultry, or lean meat: 1 serving a day ? Beans and legumes: 2 or more servings a week ? Nuts and seeds: 1-2 servings a day ? Whole grains: 6-8 servings a day ? Extra-virgin olive oil: 3-4 servings a day  Limit red meat and sweets to only a few servings a month What are my food choices?  Mediterranean diet ? Recommended  Grains: Whole-grain pasta. Brown rice. Bulgar wheat. Polenta. Couscous. Whole-wheat  bread. [place].  Vegetables: Artichokes. Beets. Broccoli. Cabbage. Carrots. Eggplant. Green beans. Chard. Kale. Spinach. Onions. Leeks. Peas. Squash. Tomatoes. Peppers. Radishes.  Fruits: Apples. Apricots. Avocado. Berries. Bananas. Cherries. Dates. Figs. Grapes. Lemons. Melon. Oranges. Peaches. Plums. Pomegranate.  Meats and other protein foods: Beans. Almonds. Sunflower seeds. Pine nuts. Peanuts. [place]. Salmon. Scallops. Shrimp. [place]. Tilapia. Clams. Oysters. Eggs.  Dairy: Low-fat milk. Cheese. Greek yogurt.  Beverages: Water. Red wine. Herbal tea.  Fats and oils: Extra virgin olive oil. Avocado oil. Grape seed oil.  Sweets and desserts: [place] yogurt with honey. Baked apples. Poached pears. Trail mix.  Seasoning and other foods: Basil. Cilantro. Coriander. Cumin. Mint. Parsley. Sage. Rosemary. Tarragon. Garlic. Oregano. Thyme. Pepper. Balsalmic vinegar. Tahini. Hummus. Tomato sauce. Olives. Mushrooms. ? Limit these  Grains: Prepackaged pasta or rice dishes. Prepackaged cereal with added sugar.  Vegetables: Deep fried potatoes (french fries).  Fruits: Fruit canned in syrup.  Meats and other protein foods: Beef. Pork. Lamb. Poultry with skin. Hot dogs. Berniece Salines.  Dairy: Ice cream. Sour cream. Whole milk.  Beverages: Juice. Sugar-sweetened soft drinks. Beer. Liquor and spirits.  Fats and oils: Butter. Canola oil. Vegetable oil. Beef fat (tallow). Lard.  Sweets and desserts: Cookies. Cakes. Pies. Candy.  Seasoning and other foods: Mayonnaise. Premade sauces and marinades. The items listed may not be a complete list. Talk with your dietitian about what dietary choices are right for you. Summary  The Mediterranean diet includes both food and lifestyle choices.  Eat a variety of fresh fruits and vegetables, beans, nuts, seeds, and whole grains.  Limit the amount of red meat and sweets that you eat.  Talk with your health care provider about whether it is safe for you to  drink red wine in moderation. This means 1 glass a day for nonpregnant women and 2 glasses a day for men. A glass of wine equals 5 oz (150 mL). This information is not intended to replace advice given to you by your health care provider. Make sure you discuss any questions you have with your health care provider. Document Revised: 03/26/2016 Document Reviewed: 03/19/2016 Elsevier Patient Education  [place].

## 2020-04-24 ENCOUNTER — Ambulatory Visit (INDEPENDENT_AMBULATORY_CARE_PROVIDER_SITE_OTHER): Payer: Medicare Other

## 2020-04-24 ENCOUNTER — Encounter: Payer: Self-pay | Admitting: Neurology

## 2020-04-24 DIAGNOSIS — J309 Allergic rhinitis, unspecified: Secondary | ICD-10-CM

## 2020-04-29 ENCOUNTER — Telehealth: Payer: Self-pay | Admitting: Pulmonary Disease

## 2020-04-29 NOTE — Telephone Encounter (Signed)
Nucala Order: 154m #1 Vial Order Date: 04/29/20 Expected date of arrival: 04/30/20 Ordered by: [place] BNigel [place]

## 2020-05-01 ENCOUNTER — Ambulatory Visit (INDEPENDENT_AMBULATORY_CARE_PROVIDER_SITE_OTHER): Payer: Medicare Other

## 2020-05-01 DIAGNOSIS — J309 Allergic rhinitis, unspecified: Secondary | ICD-10-CM

## 2020-05-06 NOTE — Telephone Encounter (Signed)
Nucala Shipment Received: 16m #1 vial Medication arrival date: 05/06/20 Lot #: [id number] date: 09/10/2023 Received by: LElliot Dally

## 2020-05-08 ENCOUNTER — Ambulatory Visit (INDEPENDENT_AMBULATORY_CARE_PROVIDER_SITE_OTHER): Payer: Medicare Other

## 2020-05-08 DIAGNOSIS — J309 Allergic rhinitis, unspecified: Secondary | ICD-10-CM | POA: Diagnosis not present

## 2020-05-10 ENCOUNTER — Ambulatory Visit (INDEPENDENT_AMBULATORY_CARE_PROVIDER_SITE_OTHER): Payer: Medicare Other

## 2020-05-10 ENCOUNTER — Other Ambulatory Visit: Payer: Self-pay

## 2020-05-10 DIAGNOSIS — J4550 Severe persistent asthma, uncomplicated: Secondary | ICD-10-CM | POA: Diagnosis not present

## 2020-05-10 MED ORDER — MEPOLIZUMAB 100 MG ~~LOC~~ SOLR
100.0000 mg | Freq: Once | SUBCUTANEOUS | Status: AC
  Administered 2020-05-10: 100 mg via SUBCUTANEOUS

## 2020-05-10 NOTE — Progress Notes (Signed)
Have you been hospitalized within the last 10 days?  No Do you have a fever?  No Do you have a cough?  No Do you have a headache or sore throat? No Do you have your Epi Pen visible and is it within date?  Yes 

## 2020-05-14 ENCOUNTER — Ambulatory Visit (INDEPENDENT_AMBULATORY_CARE_PROVIDER_SITE_OTHER): Payer: Medicare Other

## 2020-05-14 DIAGNOSIS — I639 Cerebral infarction, unspecified: Secondary | ICD-10-CM

## 2020-05-14 LAB — CUP PACEART REMOTE DEVICE CHECK
Date Time Interrogation Session: 20211004230150
Implantable Pulse Generator Implant Date: 20210107

## 2020-05-17 NOTE — Progress Notes (Signed)
Carelink Summary Report / Loop Recorder 

## 2020-05-27 ENCOUNTER — Telehealth: Payer: Self-pay | Admitting: Pulmonary Disease

## 2020-05-27 NOTE — Telephone Encounter (Signed)
Nucala Order: 142m #1 Vial Order Date: 05/27/20 Expected date of arrival: 05/28/20 Ordered by: [place]

## 2020-05-28 NOTE — Telephone Encounter (Signed)
Nucala Shipment Received: 133m #1 vial Medication arrival date: 05/28/20 Lot #: [id number] Exp date: 10/08/2023 Received by: LElliot Dally

## 2020-06-05 ENCOUNTER — Ambulatory Visit (INDEPENDENT_AMBULATORY_CARE_PROVIDER_SITE_OTHER): Payer: Medicare Other

## 2020-06-05 DIAGNOSIS — J309 Allergic rhinitis, unspecified: Secondary | ICD-10-CM | POA: Diagnosis not present

## 2020-06-07 ENCOUNTER — Ambulatory Visit: Payer: Medicare Other

## 2020-06-10 ENCOUNTER — Ambulatory Visit (INDEPENDENT_AMBULATORY_CARE_PROVIDER_SITE_OTHER): Payer: Medicare Other

## 2020-06-10 ENCOUNTER — Other Ambulatory Visit: Payer: Self-pay

## 2020-06-10 DIAGNOSIS — J4550 Severe persistent asthma, uncomplicated: Secondary | ICD-10-CM

## 2020-06-10 MED ORDER — MEPOLIZUMAB 100 MG ~~LOC~~ SOLR
100.0000 mg | Freq: Once | SUBCUTANEOUS | Status: AC
  Administered 2020-06-10: 100 mg via SUBCUTANEOUS

## 2020-06-10 NOTE — Progress Notes (Signed)
Have you been hospitalized within the last 10 days?  No Do you have a fever?  No Do you have a cough?  No Do you have a headache or sore throat? No Do you have your Epi Pen visible and is it within date?  Yes 

## 2020-06-16 LAB — CUP PACEART REMOTE DEVICE CHECK
Date Time Interrogation Session: 20211106230120
Implantable Pulse Generator Implant Date: 20210107

## 2020-06-17 ENCOUNTER — Ambulatory Visit (INDEPENDENT_AMBULATORY_CARE_PROVIDER_SITE_OTHER): Payer: Medicare Other

## 2020-06-17 DIAGNOSIS — I639 Cerebral infarction, unspecified: Secondary | ICD-10-CM

## 2020-06-18 NOTE — Progress Notes (Signed)
Carelink Summary Report / Loop Recorder 

## 2020-06-19 ENCOUNTER — Other Ambulatory Visit: Payer: Self-pay

## 2020-06-19 ENCOUNTER — Encounter: Payer: Self-pay | Admitting: Allergy & Immunology

## 2020-06-19 ENCOUNTER — Ambulatory Visit (INDEPENDENT_AMBULATORY_CARE_PROVIDER_SITE_OTHER): Payer: Medicare Other | Admitting: Allergy & Immunology

## 2020-06-19 VITALS — BP 128/86 | HR 75 | Resp 18 | Ht 67.0 in | Wt 181.0 lb

## 2020-06-19 DIAGNOSIS — J3089 Other allergic rhinitis: Secondary | ICD-10-CM

## 2020-06-19 DIAGNOSIS — H1010 Acute atopic conjunctivitis, unspecified eye: Secondary | ICD-10-CM | POA: Diagnosis not present

## 2020-06-19 DIAGNOSIS — J302 Other seasonal allergic rhinitis: Secondary | ICD-10-CM | POA: Diagnosis not present

## 2020-06-19 DIAGNOSIS — I639 Cerebral infarction, unspecified: Secondary | ICD-10-CM | POA: Diagnosis not present

## 2020-06-19 DIAGNOSIS — J4550 Severe persistent asthma, uncomplicated: Secondary | ICD-10-CM

## 2020-06-19 MED ORDER — OLOPATADINE HCL 0.1 % OP SOLN
1.0000 [drp] | Freq: Two times a day (BID) | OPHTHALMIC | 5 refills | Status: DC
Start: 2020-06-19 — End: 2021-04-16

## 2020-06-19 NOTE — Progress Notes (Signed)
FOLLOW UP  Date of Service/Encounter:  06/19/20   Assessment:   Severe persistent asthma, with acute exacerbation likely secondary to leaf exposure  Chronic nonseasonal allergic rhinitis (trees, weeds, mold, dust mite)- on allergen immunotherapy  Previous World Location manager  Recent stroke  Plan/Recommendations:   1. Severe persistent asthma, uncomplicated - Spirometry looks excellent today.  - Use the albuterol nebulizer twice daily for one week and then stop. - Add on a prednisone dose pack to see if this provides more timely improvement in your symptoms. - Daily controller medication(s): Symbicort 160/4.5 two puffs twice daily + Spiriva 2.8mg one puff once daily + Singulair (montelukast) 111mdaily + Nucala 10063monthly - Rescue medications: albuterol 4 puffs every 4-6 hours as needed - Asthma control goals:  * Full participation in all desired activities (may need albuterol before activity) * Albuterol use two time or less a week on average (not counting use with activity) * Cough interfering with sleep two time or less a month * Oral steroids no more than once a year * No hospitalizations  2.Chronic nonseasonal allergic rhinitis - Continue with nasal ipratropium one spray per nostril up to three times daily (this can be over drying, so watch out). - Use nasal saline rinses. - Continue with allergy shots at the same schedule, but notice whether your symptoms get worse after a couple of weeks.   3. Return in about 6 months (around 12/17/2020).   Subjective:   Caleb Evans a [age]o. male presenting today for follow up of  Chief Complaint  Patient presents with  . Asthma  . Allergic Rhinitis     Caleb Evans a history of the following: Patient Active Problem List   Diagnosis Date Noted  . Cryptogenic stroke (HCC[place]1/02/2020  . Medication management 06/13/2019  . CVA (cerebral vascular accident) (HCC[place]iley[place]0/19/2020  .  Former smoker 05/29/2019  . Aneurysm of thoracic aorta (HCCWanche[place]4/15/2019  . Hyperlipidemia 11/22/2017  . AAA (abdominal aortic aneurysm) without rupture (HCC[place]4/15/2019  . Seasonal and perennial allergic rhinitis 04/19/2017  . Severe persistent asthma, uncomplicated 08/35/32/9924 GERD (gastroesophageal reflux disease) 04/08/2016  . Chronic nonseasonal allergic rhinitis due to fungal spores 01/17/2014  . Asthma-COPD overlap syndrome (HCCNorm[place]6/05/2014  . Mild chronic obstructive pulmonary disease (HCCRalls6/05/2014    History obtained from: chart review and patient.  Caleb Evans a [age]o. male presenting for a follow up visit.  He was last seen in May 2021.  At that time, we continue with the Symbicort 160/4.5 mcg 2 puffs twice daily.  We also continued with his mepolizumab, which he receives [facility].  For his allergic rhinitis, we continue with allergy shots every 4 weeks as well as Flonase and ipratropium.  He also has Astelin nasal spray.  He previously been on [facility] excellent results, but his insurance stopped covering it.  Therefore, we are trying to get through with a combination of other nasal sprays.  In the interim, he has done very well.  This is actually his anniversary night and he is making chicken soup, which he calls "Jewish penicillin".  Asthma/Respiratory Symptom History: He reports that he has been having some breathing issues. He will start coughing and will have some problems breathing in. Symptoms have been going on for a few weeks. Leaves blowing around make his symptoms worse. This tends to happen around this time all of the time. He has been using the  ProAir inhaler with him to help. Last prednisone was two years ago. He is on the Symbicort BID. He is up to date on his Nucala. He feels good around two days after getting the Nucala. He goes to [place] to [place], and we have offered to give it here.  However, his wife apparently likes spending the time in  [place] so that she can shop at [facility].    Allergic Rhinitis Symptom History: He is on azelastine twice daily. He is on the fluticasone in the middle of the day. Shots are going well. He knows that he is much better with the shots compared to without them.  He remains on his nasal sprays and takes a variety of them depending on how his symptoms are going.  He has not needed antibiotics or prednisone in quite some time.  He continues to go to Rehab for his history of a stroke. He is fully vaccinated to [place].   Otherwise, there have been no changes to his past medical history, surgical history, family history, or social history.    Review of Systems  Constitutional: Negative.  Negative for chills, fever, malaise/fatigue and weight loss.  HENT: Positive for congestion. Negative for ear discharge, ear pain and sinus pain.   Eyes: Negative for pain, discharge and redness.  Respiratory: Positive for shortness of breath. Negative for cough, sputum production and wheezing.   Cardiovascular: Negative.  Negative for chest pain and palpitations.  Gastrointestinal: Negative for abdominal pain, constipation, diarrhea, heartburn, nausea and vomiting.  Skin: Negative.  Negative for itching and rash.  Neurological: Negative for dizziness and headaches.  Endo/Heme/Allergies: Negative for environmental allergies. Does not bruise/bleed easily.       Objective:   Blood pressure 128/86, pulse 75, resp. rate 18, height 5' 7"  (1.702 m), weight 181 lb (82.1 kg), SpO2 94 %. Body mass index is 28.35 kg/m.   Physical Exam:  Physical Exam Constitutional:      Appearance: He is well-developed.     Comments: Pleasant boisterous male.   HENT:     Head: Normocephalic and atraumatic.     Right Ear: Tympanic membrane, ear canal and external ear normal.     Left Ear: Tympanic membrane, ear canal and external ear normal.     Nose: No nasal deformity, septal deviation, mucosal edema or rhinorrhea.      Right Turbinates: Enlarged and swollen.     Left Turbinates: Enlarged and swollen.     Right Sinus: No maxillary sinus tenderness or frontal sinus tenderness.     Left Sinus: No maxillary sinus tenderness or frontal sinus tenderness.     Mouth/Throat:     Mouth: Mucous membranes are not pale and not dry.     Pharynx: Uvula midline.  Eyes:     General:        Right eye: No discharge.        Left eye: No discharge.     Conjunctiva/sclera: Conjunctivae normal.     Right eye: Right conjunctiva is not injected. No chemosis.    Left eye: Left conjunctiva is not injected. No chemosis.    Pupils: Pupils are equal, round, and reactive to light.  Cardiovascular:     Rate and Rhythm: Normal rate and regular rhythm.     Heart sounds: Normal heart sounds.  Pulmonary:     Effort: Pulmonary effort is normal. No tachypnea, accessory muscle usage or respiratory distress.     Breath sounds: Normal breath sounds. No wheezing, rhonchi  or rales.     Comments: Moving air well in all lung fields.  Chest:     Chest wall: No tenderness.  Lymphadenopathy:     Cervical: No cervical adenopathy.  Skin:    Coloration: Skin is not pale.     Findings: No abrasion, erythema, petechiae or rash. Rash is not papular, urticarial or vesicular.     Comments: No eczematous or urticarial lesions noted.   Neurological:     Mental Status: He is alert.  Psychiatric:        Behavior: Behavior is cooperative.      Diagnostic studies:    Spirometry: results normal (FEV1: 2.45/82%, FVC: 3%, FEV1/FVC: 3.27/81%).    Spirometry consistent with normal pattern.   Allergy Studies: none      [name], MD  [facility]

## 2020-06-19 NOTE — Patient Instructions (Addendum)
1. Severe persistent asthma, uncomplicated - Spirometry looks excellent today.  - Use the albuterol nebulizer twice daily for one week and then stop. - Add on a prednisone dose pack to see if this provides more timely improvement in your symptoms. - Daily controller medication(s): Symbicort 160/4.5 two puffs twice daily + Spiriva 2.43mg one puff once daily + Singulair (montelukast) 160mdaily + Nucala 1007monthly - Rescue medications: albuterol 4 puffs every 4-6 hours as needed - Asthma control goals:  * Full participation in all desired activities (may need albuterol before activity) * Albuterol use two time or less a week on average (not counting use with activity) * Cough interfering with sleep two time or less a month * Oral steroids no more than once a year * No hospitalizations  2.Chronic nonseasonal allergic rhinitis - Continue with nasal ipratropium one spray per nostril up to three times daily (this can be over drying, so watch out). - Use nasal saline rinses. - Continue with allergy shots at the same schedule, but notice whether your symptoms get worse after a couple of weeks.   3. Return in about 6 months (around 12/17/2020).    Please inform us [place] any Emergency Department visits, hospitalizations, or changes in symptoms. Call us [place] going to the ED for breathing or allergy symptoms since we might be able to fit you in for a sick visit. Feel free to contact us [place] with any questions, problems, or concerns.  It was a pleasure to see you again today! Glad you are making such a speedy recovery!   Websites that have reliable patient information: 1. American Academy of Asthma, Allergy, and Immunology: www.aaaai.org 2. Food Allergy Research and Education (FARE): foodallergy.org 3. Mothers of Asthmatics: http://www.asthmacommunitynetwork.org 4. American College of Allergy, Asthma, and Immunology: www.acaai.org  "Like" us [place] Facebook and Instagram for our latest updates!        Make sure you are registered to vote! If you have moved or changed any of your contact information, you will need to get this updated before voting!  In some cases, you MAY be able to register to vote online: [email or web address]

## 2020-06-20 ENCOUNTER — Encounter: Payer: Self-pay | Admitting: Allergy & Immunology

## 2020-06-28 ENCOUNTER — Telehealth: Payer: Self-pay | Admitting: Pulmonary Disease

## 2020-06-28 NOTE — Telephone Encounter (Signed)
Nucala Order: 141m #1 Vial Order Date: 06/28/20 Expected date of arrival: 07/01/20 Ordered by: LE[place] BNigel Mormon

## 2020-07-01 NOTE — Telephone Encounter (Signed)
Nucala Shipment Received: 181m #1 vial Medication arrival date: 07/01/20 Lot #: [id number] date: 11/08/2023 Received by: LElliot Dally

## 2020-07-03 ENCOUNTER — Ambulatory Visit (INDEPENDENT_AMBULATORY_CARE_PROVIDER_SITE_OTHER): Payer: Medicare Other

## 2020-07-03 DIAGNOSIS — J309 Allergic rhinitis, unspecified: Secondary | ICD-10-CM

## 2020-07-09 ENCOUNTER — Other Ambulatory Visit: Payer: Self-pay

## 2020-07-09 ENCOUNTER — Ambulatory Visit (INDEPENDENT_AMBULATORY_CARE_PROVIDER_SITE_OTHER): Payer: Medicare Other

## 2020-07-09 DIAGNOSIS — J4550 Severe persistent asthma, uncomplicated: Secondary | ICD-10-CM | POA: Diagnosis not present

## 2020-07-09 MED ORDER — MEPOLIZUMAB 100 MG ~~LOC~~ SOLR
100.0000 mg | SUBCUTANEOUS | Status: DC
  Administered 2020-07-09: 100 mg via SUBCUTANEOUS

## 2020-07-09 NOTE — Progress Notes (Signed)
Have you been hospitalized within the last 10 days?  No Do you have a fever?  No Do you have a cough?  No Do you have a headache or sore throat? No Do you have your Epi Pen visible and is it within date?  Yes 

## 2020-07-21 LAB — CUP PACEART REMOTE DEVICE CHECK
Date Time Interrogation Session: 20211210003046
Implantable Pulse Generator Implant Date: 20210107

## 2020-07-22 ENCOUNTER — Ambulatory Visit (INDEPENDENT_AMBULATORY_CARE_PROVIDER_SITE_OTHER): Payer: Medicare Other

## 2020-07-22 DIAGNOSIS — I639 Cerebral infarction, unspecified: Secondary | ICD-10-CM

## 2020-07-23 ENCOUNTER — Telehealth: Payer: Self-pay | Admitting: Pulmonary Disease

## 2020-07-23 NOTE — Telephone Encounter (Signed)
Nucala Order: 129m #1 Vial Order Date: 07/23/20 Expected date of arrival: 07/24/20 Ordered by: [place]igel [place]

## 2020-07-24 NOTE — Telephone Encounter (Signed)
Nucala Shipment Received: 178m #1 vial Medication arrival date: 07/24/20 Lot #: [id number] Exp date: 11/08/2023 Received by: LElliot Dally

## 2020-07-31 ENCOUNTER — Ambulatory Visit (INDEPENDENT_AMBULATORY_CARE_PROVIDER_SITE_OTHER): Payer: Medicare Other

## 2020-07-31 DIAGNOSIS — J309 Allergic rhinitis, unspecified: Secondary | ICD-10-CM

## 2020-08-06 ENCOUNTER — Telehealth: Payer: Self-pay | Admitting: Pulmonary Disease

## 2020-08-06 ENCOUNTER — Ambulatory Visit: Payer: Medicare Other

## 2020-08-06 NOTE — Progress Notes (Signed)
Carelink Summary Report / Loop Recorder 

## 2020-08-06 NOTE — Telephone Encounter (Signed)
Called and spoke with Patient's Wife Manuela Schwartz.  Patient is currently in [place], but is available 08/08/20.  Patient scheduled 08/08/20 at 1115 for Nucala injection.

## 2020-08-08 ENCOUNTER — Ambulatory Visit (INDEPENDENT_AMBULATORY_CARE_PROVIDER_SITE_OTHER): Payer: Medicare Other

## 2020-08-08 ENCOUNTER — Other Ambulatory Visit: Payer: Self-pay

## 2020-08-08 DIAGNOSIS — J4550 Severe persistent asthma, uncomplicated: Secondary | ICD-10-CM

## 2020-08-08 MED ORDER — MEPOLIZUMAB 100 MG ~~LOC~~ SOLR
100.0000 mg | Freq: Once | SUBCUTANEOUS | Status: AC
  Administered 2020-08-08: 11:00:00 100 mg via SUBCUTANEOUS

## 2020-08-08 NOTE — Progress Notes (Signed)
Have you been hospitalized within the last 10 days?  No Do you have a fever?  No Do you have a cough?  No Do you have a headache or sore throat? No  

## 2020-08-22 ENCOUNTER — Telehealth: Payer: Self-pay | Admitting: Pulmonary Disease

## 2020-08-22 NOTE — Telephone Encounter (Signed)
[name] #1 Vial Order Date: 08/22/20 Expected date of arrival: 1/14/2 Ordered by: [place] BNigel Mormon

## 2020-08-23 NOTE — Telephone Encounter (Signed)
Nucala Shipment Received: 133m #1 vial Medication arrival date: 08/23/20 Lot #: [id number] date: 11/08/2023 Received by: LElliot Dally

## 2020-08-26 ENCOUNTER — Ambulatory Visit (INDEPENDENT_AMBULATORY_CARE_PROVIDER_SITE_OTHER): Payer: Medicare Other

## 2020-08-26 DIAGNOSIS — I639 Cerebral infarction, unspecified: Secondary | ICD-10-CM

## 2020-08-28 ENCOUNTER — Ambulatory Visit (INDEPENDENT_AMBULATORY_CARE_PROVIDER_SITE_OTHER): Payer: Medicare Other

## 2020-08-28 DIAGNOSIS — J309 Allergic rhinitis, unspecified: Secondary | ICD-10-CM

## 2020-08-28 LAB — CUP PACEART REMOTE DEVICE CHECK
Date Time Interrogation Session: 20220111230120
Implantable Pulse Generator Implant Date: 20210107

## 2020-08-29 ENCOUNTER — Ambulatory Visit (INDEPENDENT_AMBULATORY_CARE_PROVIDER_SITE_OTHER): Payer: Medicare Other | Admitting: Internal Medicine

## 2020-08-29 ENCOUNTER — Other Ambulatory Visit: Payer: Self-pay

## 2020-08-29 ENCOUNTER — Encounter: Payer: Self-pay | Admitting: Internal Medicine

## 2020-08-29 VITALS — BP 110/78 | HR 77 | Ht 67.0 in | Wt 184.0 lb

## 2020-08-29 DIAGNOSIS — I639 Cerebral infarction, unspecified: Secondary | ICD-10-CM | POA: Diagnosis not present

## 2020-08-29 NOTE — Progress Notes (Signed)
HPI Caleb Evans returns today for followup and to undergo ILR insertion. He is a pleasant 70 yo man with a cryptogenic stroke, who present to undergo ILR insertion. The patient has no questions regarding the device placement. Allergies  Allergen Reactions  . Chlorpheniramine Other (See Comments)    Sinus congestion  . Amoxicillin-Pot Clavulanate Other (See Comments)    UNSPECIFIED REACTION  Has patient had a PCN reaction causing immediate rash, facial/tongue/throat swelling, SOB or lightheadedness with hypotension: No Has patient had a PCN reaction causing severe rash involving mucus membranes or skin necrosis: No Has patient had a PCN reaction that required hospitalization: No Has patient had a PCN reaction occurring within the last 10 years: Yes If all of the above answers are "NO", then may proceed with Cephalosporin use.  Caused liver failure   . Lisinopril Cough     Current Outpatient Medications  Medication Sig Dispense Refill  . aspirin 81 MG chewable tablet Chew 1 tablet (81 mg total) by mouth daily.    Marland Kitchen atorvastatin (LIPITOR) 80 MG tablet Take 80 mg by mouth at bedtime.    Marland Kitchen azelastine (ASTELIN) 0.1 % nasal spray PLACE 2 SPRAYS IN BOTH NOSTRILS 2 TIMES DAILY. 30 mL 6  . benzonatate (TESSALON) 100 MG capsule TAKE 1 CAPSULE THREE TIMES A DAY AS NEEDED FOR COUGH. 20 capsule 0  . budesonide-formoterol (SYMBICORT) 160-4.5 MCG/ACT inhaler Inhale 2 puffs into the lungs 2 (two) times daily. Uses as needed.    . Calcium Carb-Cholecalciferol (CALCIUM 600+D3 PO) Take 1 tablet by mouth daily.    . fluticasone (FLONASE) 50 MCG/ACT nasal spray Place 2 sprays into both nostrils daily.    Marland Kitchen ipratropium-albuterol (DUONEB) 0.5-2.5 (3) MG/3ML SOLN Take 3 mLs by nebulization every 4 (four) hours as needed.    Marland Kitchen losartan (COZAAR) 25 MG tablet Take 25 mg by mouth daily.    . Mepolizumab (NUCALA) 100 MG/ML SOAJ Inject into the skin.    . Multiple Vitamin (MULTIVITAMIN WITH MINERALS) TABS  tablet Take 1 tablet by mouth daily.    Marland Kitchen olopatadine (PATANOL) 0.1 % ophthalmic solution Place 1 drop into both eyes 2 (two) times daily. 5 mL 5  . PROAIR HFA 108 (90 Base) MCG/ACT inhaler INHALE 2 PUFFS EVERY 4 TO 6 HOURS AS NEEDED. 18 g 1   Current Facility-Administered Medications  Medication Dose Route Frequency Provider Last Rate Last Admin  . Mepolizumab SOLR 100 mg  100 mg Subcutaneous Q28 days Mannam, Praveen, MD   100 mg at 06/21/18 0809  . Mepolizumab SOLR 100 mg  100 mg Subcutaneous Q28 days Mannam, Praveen, MD   100 mg at 09/23/18 1621  . Mepolizumab SOLR 100 mg  100 mg Subcutaneous Q28 days Mannam, Praveen, MD   100 mg at 12/26/18 1411  . Mepolizumab SOLR 100 mg  100 mg Subcutaneous Q28 days Mannam, Praveen, MD   100 mg at 01/23/19 1405  . Mepolizumab SOLR 100 mg  100 mg Subcutaneous Q28 days Mannam, Praveen, MD   100 mg at 02/23/19 1347  . Mepolizumab SOLR 100 mg  100 mg Subcutaneous Q28 days Mannam, Praveen, MD   100 mg at 03/27/19 1359  . Mepolizumab SOLR 100 mg  100 mg Subcutaneous Q28 days Mannam, Praveen, MD   100 mg at 04/27/19 1413  . Mepolizumab SOLR 100 mg  100 mg Subcutaneous Q28 days Mannam, Praveen, MD   100 mg at 07/24/19 1408  . Mepolizumab SOLR 100 mg  100 mg  Subcutaneous Q28 days Mannam, Praveen, MD   100 mg at 08/24/19 1337  . Mepolizumab SOLR 100 mg  100 mg Subcutaneous Q28 days Mannam, Praveen, MD   100 mg at 09/21/19 1421  . Mepolizumab SOLR 100 mg  100 mg Subcutaneous Q28 days Mannam, Praveen, MD   100 mg at 12/01/19 1637  . Mepolizumab SOLR 100 mg  100 mg Subcutaneous Q28 days Mannam, Praveen, MD   100 mg at 01/09/20 1314  . Mepolizumab SOLR 100 mg  100 mg Subcutaneous Q28 days Mannam, Praveen, MD   100 mg at 03/13/20 1349  . Mepolizumab SOLR 100 mg  100 mg Subcutaneous Q28 days Lauraine Rinne, NP   100 mg at 07/09/20 1401   Facility-Administered Medications Ordered in Other Visits  Medication Dose Route Frequency Provider Last Rate Last Admin  . clindamycin  (CLEOCIN) 900 mg in dextrose 5 % 50 mL IVPB  900 mg Intravenous 60 min Pre-Op Stark Klein, MD       And  . gentamicin (GARAMYCIN) 390 mg in dextrose 5 % 50 mL IVPB  5 mg/kg Intravenous 60 min Pre-Op Stark Klein, MD         Past Medical History:  Diagnosis Date  . AAA (abdominal aortic aneurysm) ([place])   . Adrenal nodule ([place])   . Aneurysm of thoracic aorta ([place]) 11/22/2017  . Asthma   . Asthma-COPD overlap syndrome ([place]) 01/17/2014  . Bladder mass   . Bladder neoplasm   . Cancer [facility])    bladder cancer  . COPD (chronic obstructive pulmonary disease) ([place])   . Crohn's disease ([place])   . CVA (cerebral vascular accident) ([place]) 05/29/2019  . Diverticulitis   . Former smoker 05/29/2019  . GERD (gastroesophageal reflux disease)    takes Nexium off and on  . Hyperlipidemia 11/22/2017  . Over weight   . Seasonal and perennial allergic rhinitis 04/19/2017  . Skin lesion   . Thoracic aortic aneurysm ([place])   . Weak urinary stream     ROS:   All systems reviewed and negative except as noted in the HPI.   Past Surgical History:  Procedure Laterality Date  . APPENDECTOMY    . CHOLECYSTECTOMY    . LAPAROSCOPIC PARTIAL COLECTOMY  01/18/2018   ERAS PATHWAY  . LAPAROSCOPIC PARTIAL COLECTOMY N/A 01/18/2018   Procedure: LAPAROSCOPIC PARTIAL COLECTOMY ERAS PATHWAY;  Surgeon: Stark Klein, MD;  Location: [place];  Service: General;  Laterality: N/A;  . TONSILLECTOMY       Family History  Adopted: Yes  Problem Relation Age of Onset  . Healthy Son   . Healthy Daughter   . Bladder Cancer Neg Hx   . Kidney cancer Neg Hx   . Prostate cancer Neg Hx   . Allergic rhinitis Neg Hx   . Angioedema Neg Hx   . Asthma Neg Hx   . Eczema Neg Hx   . Urticaria Neg Hx   . Immunodeficiency Neg Hx   . Atopy Neg Hx      Social History   Socioeconomic History  . Marital status: Married    Spouse name: Not on file  . Number of children: 2  . Years of education: Not on file  . Highest education  level: Associate degree: occupational, Hotel manager, or vocational program  Occupational History  . Not on file  Tobacco Use  . Smoking status: Former Smoker    Packs/day: 3.00    Years: 50.00    Pack years: 150.00  Types: Cigarettes    Quit date: 04/10/2018    Years since quitting: 2.3  . Smokeless tobacco: Never Used  Vaping Use  . Vaping Use: Never used  Substance and Sexual Activity  . Alcohol use: Yes    Alcohol/week: 0.0 standard drinks    Comment: occasional  . Drug use: No  . Sexual activity: Not on file  Other Topics Concern  . Not on file  Social History Narrative   Patient is adopted   Married, lives with spouse   2 children (boy and girl)   OCCUPATION: Dealer x39yr.  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   Right handed   Pt drinks 2 cups of coffee a day, rarely drinks tea, he drinks soda 3-4 a day   Social Determinants of Health   Financial Resource Strain: Not on file  Food Insecurity: Not on file  Transportation Needs: Not on file  Physical Activity: Not on file  Stress: Not on file  Social Connections: Not on file  Intimate Partner Violence: Not on file     BP 110/78   Pulse 77   Ht 5' 7"  (1.702 m)   Wt 184 lb (83.5 kg)   SpO2 95%   BMI 28.82 kg/m   Physical Exam:  Well appearing NAD HEENT: Unremarkable Neck:  No JVD, no thyromegally Lymphatics:  No adenopathy Back:  No CVA tenderness Lungs:  Clear HEART:  Regular rate rhythm, no murmurs, no rubs, no clicks Abd:  soft, positive bowel sounds, no organomegally, no rebound, no guarding Ext:  2 plus pulses, no edema, no cyanosis, no clubbing Skin:  No rashes no nodules Neuro:  CN II through XII intact, motor grossly intact  EKG - nsr  DEVICE  Normal device function.  See PaceArt for details.   Assess/Plan: 1. Cryptogenic stroke - he has undergone ILR insertion. He will undergo watchful waiting. Hopefully we will be able to determine the etiology of his  stroke with his newly placed ILR. So far he has not had atrial fib.  2. HTN - his bp is well controlled. He will continue his current meds.   GCarleene OverlieTaylor,MD

## 2020-08-29 NOTE — Patient Instructions (Signed)
Medication Instructions:  Your physician recommends that you continue on your current medications as directed. Please refer to the Current Medication list given to you today.  Labwork: None ordered.  Testing/Procedures: None ordered.  Follow-Up: Your physician wants you to follow-up in: one year with Cristopher Peru, MD or one of the following Advanced Practice Providers on your designated Care Team:    Chanetta Marshall, NP  Tommye Standard, PA-C  Legrand Como "Jonni Sanger" [place], [place]  Remote monitoring is used to monitor your loop recorder monthly.  Any Other Special Instructions Will Be Listed Below (If Applicable).  If you need a refill on your cardiac medications before your next appointment, please call your pharmacy.

## 2020-08-31 ENCOUNTER — Other Ambulatory Visit: Payer: Self-pay | Admitting: Allergy & Immunology

## 2020-09-06 ENCOUNTER — Ambulatory Visit (INDEPENDENT_AMBULATORY_CARE_PROVIDER_SITE_OTHER): Payer: Medicare Other

## 2020-09-06 ENCOUNTER — Other Ambulatory Visit: Payer: Self-pay

## 2020-09-06 DIAGNOSIS — J4550 Severe persistent asthma, uncomplicated: Secondary | ICD-10-CM | POA: Diagnosis not present

## 2020-09-06 MED ORDER — MEPOLIZUMAB 100 MG ~~LOC~~ SOLR
100.0000 mg | Freq: Once | SUBCUTANEOUS | Status: AC
  Administered 2020-09-06: 100 mg via SUBCUTANEOUS

## 2020-09-06 NOTE — Progress Notes (Signed)
Have you been hospitalized within the last 10 days?  No Do you have a fever?  No Do you have a cough?  No Do you have a headache or sore throat? No  

## 2020-09-09 DIAGNOSIS — J3089 Other allergic rhinitis: Secondary | ICD-10-CM | POA: Diagnosis not present

## 2020-09-09 NOTE — Progress Notes (Signed)
VIAL EXP 09-09-21

## 2020-09-10 NOTE — Progress Notes (Signed)
Carelink Summary Report / Loop Recorder 

## 2020-09-11 ENCOUNTER — Other Ambulatory Visit: Payer: Self-pay

## 2020-09-11 ENCOUNTER — Encounter: Payer: Self-pay | Admitting: Pulmonary Disease

## 2020-09-11 ENCOUNTER — Ambulatory Visit (INDEPENDENT_AMBULATORY_CARE_PROVIDER_SITE_OTHER): Payer: Medicare Other | Admitting: Pulmonary Disease

## 2020-09-11 VITALS — BP 136/80 | HR 70 | Temp 98.0°F | Ht 68.0 in | Wt 184.2 lb

## 2020-09-11 DIAGNOSIS — Z87891 Personal history of nicotine dependence: Secondary | ICD-10-CM

## 2020-09-11 DIAGNOSIS — J4550 Severe persistent asthma, uncomplicated: Secondary | ICD-10-CM | POA: Diagnosis not present

## 2020-09-11 DIAGNOSIS — J449 Chronic obstructive pulmonary disease, unspecified: Secondary | ICD-10-CM | POA: Diagnosis not present

## 2020-09-11 DIAGNOSIS — J302 Other seasonal allergic rhinitis: Secondary | ICD-10-CM

## 2020-09-11 DIAGNOSIS — J3089 Other allergic rhinitis: Secondary | ICD-10-CM | POA: Diagnosis not present

## 2020-09-11 DIAGNOSIS — J4489 Other specified chronic obstructive pulmonary disease: Secondary | ICD-10-CM

## 2020-09-11 NOTE — Patient Instructions (Addendum)
Thank you for visiting Dr. Valeta Harms at [facility]. Today we recommend the following:  Call me if you are have any change in symptoms or frequent exacerbations.  Return in about 1 year (around 09/11/2021) for with APP or Dr. Valeta Harms.    Please do your part to reduce the spread of COVID-19.

## 2020-09-11 NOTE — Progress Notes (Signed)
Synopsis: Referred in Feb 2022 for severe asthma by Caleb Evans*  Subjective:   PATIENT ID: Caleb Evans GENDER: male DOB: 1951/04/04, MRN: [id number]  Chief Complaint  Patient presents with  . Follow-up    Pt stated that he has had a few issues at night with his breathing.  He is doing well at this time.     70 yo M, AAA, crohns, COPD, was at [employer], Retired [employer], retired early after 29 years. Mostly stable with his breathing now. He has been on Nucala injections for ~3 years. He does think it helps him. Currently using albuterol usually once per day. Rarely using nebulizer. Currently compliant with symbicort twice daily. Quit smoking 1 year ago. He still craves them on occasion. Recently on prednisone for an exacerbation while traveling in [place], [place] while on vacation.  At this point remained stable on his current regimen here today to establish care with new primary pulmonary provider.     Past Medical History:  Diagnosis Date  . AAA (abdominal aortic aneurysm) ([place])   . Adrenal nodule ([place])   . Aneurysm of thoracic aorta ([place]) 11/22/2017  . Asthma   . 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 Bladder mass   . Bladder neoplasm   . Cancer [facility])    bladder cancer  . COPD (chronic obstructive pulmonary disease) ([place])   . Crohn's disease ([place])   . CVA (cerebral vascular accident) ([place]) 05/29/2019  . Diverticulitis   . Former smoker 05/29/2019  . GERD (gastroesophageal reflux disease)    takes Nexium off and on  . Hyperlipidemia 11/22/2017  . Over weight   . Seasonal and perennial allergic rhinitis 04/19/2017  . Skin lesion   . Thoracic aortic aneurysm ([place])   . Weak urinary stream      Family History  Adopted: Yes  Problem Relation Age of Onset  . Healthy Son   . Healthy Daughter   . Bladder Cancer Neg Hx   . Kidney cancer Neg Hx   . Prostate cancer Neg Hx   . Allergic rhinitis Neg Hx   . Angioedema Neg Hx   .  Asthma Neg Hx   . Eczema Neg Hx   . Urticaria Neg Hx   . Immunodeficiency Neg Hx   . Atopy Neg Hx      Past Surgical History:  Procedure Laterality Date  . APPENDECTOMY    . CHOLECYSTECTOMY    . LAPAROSCOPIC PARTIAL COLECTOMY  01/18/2018   ERAS PATHWAY  . LAPAROSCOPIC PARTIAL COLECTOMY N/A 01/18/2018   Procedure: LAPAROSCOPIC PARTIAL COLECTOMY ERAS PATHWAY;  Surgeon: Stark Klein, MD;  Location: [facility];  Service: General;  Laterality: N/A;  . TONSILLECTOMY      Social History   Socioeconomic History  . Marital status: Married    Spouse name: Not on file  . Number of children: 2  . Years of education: Not on file  . Highest education level: Associate degree: occupational, Hotel manager, or vocational program  Occupational History  . Not on file  Tobacco Use  . Smoking status: Former Smoker    Packs/day: 3.00    Years: 50.00    Pack years: 150.00    Types: Cigarettes    Quit date: 04/10/2018    Years since quitting: 2.4  . Smokeless tobacco: Never Used  Vaping Use  . Vaping Use: Never used  Substance and Sexual Activity  . Alcohol use: Yes    Alcohol/week: 0.0 standard drinks  Comment: occasional  . Drug use: No  . Sexual activity: Not on file  Other Topics Concern  . Not on file  Social History Narrative   Patient is adopted   Married, lives with spouse   2 children (boy and girl)   OCCUPATION: Dealer x51yr.  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 Chlorpheniramine Other (See Comments)    Sinus congestion  . Amoxicillin-Pot  Clavulanate Other (See Comments)    UNSPECIFIED REACTION  Has patient had a PCN reaction causing immediate rash, facial/tongue/throat swelling, SOB or lightheadedness with hypotension: No Has patient had a PCN reaction causing severe rash involving mucus membranes or skin necrosis: No Has patient had a PCN reaction that required hospitalization: No Has patient had a PCN reaction occurring within the last 10 years: Yes If all of the above answers are "NO", then may proceed with Cephalosporin use.  Caused liver failure   . Lisinopril Cough     Outpatient Medications Prior to Visit  Medication Sig Dispense Refill  . aspirin 81 MG chewable tablet Chew 1 tablet (81 mg total) by mouth daily.    .Marland Kitchenatorvastatin (LIPITOR) 80 MG tablet Take 80 mg by mouth at bedtime.    .Marland Kitchenazelastine (ASTELIN) 0.1 % nasal spray PLACE 2 SPRAYS IN BOTH NOSTRILS 2 TIMES DAILY. 30 mL 6  . benzonatate (TESSALON) 100 MG capsule TAKE 1 CAPSULE THREE TIMES A DAY AS NEEDED FOR COUGH. 20 capsule 0  . budesonide-formoterol (SYMBICORT) 160-4.5 MCG/ACT inhaler Inhale 2 puffs into the lungs 2 (two) times daily. Uses as needed.    . Calcium Carb-Cholecalciferol (CALCIUM 600+D3 PO) Take 1 tablet by mouth daily.    . fluticasone (FLONASE) 50 MCG/ACT nasal spray INHALE 2 SPRAYS IN EACH NOSTRIL DAILY. 16 g 2  . ipratropium-albuterol (DUONEB) 0.5-2.5 (3) MG/3ML SOLN Take 3 mLs by nebulization every 4 (four) hours as needed.    .Marland Kitchenlosartan (COZAAR) 25 MG tablet Take 25 mg by mouth daily.    . Mepolizumab (NUCALA) 100 MG/ML SOAJ Inject into the skin.    . Multiple Vitamin (MULTIVITAMIN WITH MINERALS) TABS tablet Take 1 tablet by mouth daily.    .Marland Kitchenolopatadine (PATANOL) 0.1 % ophthalmic solution Place 1 drop into both eyes 2 (two) times daily. 5 mL 5  . PROAIR HFA 108 (90 Base) MCG/ACT inhaler INHALE 2 PUFFS EVERY 4 TO 6 HOURS AS NEEDED. 18 g 1   Facility-Administered Medications Prior to Visit  Medication Dose Route Frequency Provider  Last Rate Last Admin  . clindamycin (CLEOCIN) 900 mg in dextrose 5 % 50 mL IVPB  900 mg Intravenous 60 min Pre-Op BStark Klein MD       And  . gentamicin (GARAMYCIN) 390 mg in dextrose 5 % 50 mL IVPB  5 mg/kg Intravenous 60 min Pre-Op BStark Klein MD      . Mepolizumab SOLR 100 mg  100 mg Subcutaneous Q28 days Mannam, Praveen, MD   100 mg  at 06/21/18 0809  . Mepolizumab SOLR 100 mg  100 mg Subcutaneous Q28 days Mannam, Praveen, MD   100 mg at 09/23/18 1621  . Mepolizumab SOLR 100 mg  100 mg Subcutaneous Q28 days Mannam, Praveen, MD   100 mg at 12/26/18 1411  . Mepolizumab SOLR 100 mg  100 mg Subcutaneous Q28 days Mannam, Praveen, MD   100 mg at 01/23/19 1405  . Mepolizumab SOLR 100 mg  100 mg Subcutaneous Q28 days Mannam, Praveen, MD   100 mg at 02/23/19 1347  . Mepolizumab SOLR 100 mg  100 mg Subcutaneous Q28 days Mannam, Praveen, MD   100 mg at 03/27/19 1359  . Mepolizumab SOLR 100 mg  100 mg Subcutaneous Q28 days Mannam, Praveen, MD   100 mg at 04/27/19 1413  . Mepolizumab SOLR 100 mg  100 mg Subcutaneous Q28 days Mannam, Praveen, MD   100 mg at 07/24/19 1408  . Mepolizumab SOLR 100 mg  100 mg Subcutaneous Q28 days Mannam, Praveen, MD   100 mg at 08/24/19 1337  . Mepolizumab SOLR 100 mg  100 mg Subcutaneous Q28 days Mannam, Praveen, MD   100 mg at 09/21/19 1421  . Mepolizumab SOLR 100 mg  100 mg Subcutaneous Q28 days Mannam, Praveen, MD   100 mg at 12/01/19 1637  . Mepolizumab SOLR 100 mg  100 mg Subcutaneous Q28 days Mannam, Praveen, MD   100 mg at 01/09/20 1314  . Mepolizumab SOLR 100 mg  100 mg Subcutaneous Q28 days Mannam, Praveen, MD   100 mg at 03/13/20 1349  . Mepolizumab SOLR 100 mg  100 mg Subcutaneous Q28 days Lauraine Rinne, NP   100 mg at 07/09/20 1401    Review of Systems  Constitutional: Negative for chills, fever, malaise/fatigue and weight loss.  HENT: Negative for hearing loss, sore throat and tinnitus.   Eyes: Negative for blurred vision and double vision.   Respiratory: Positive for cough, shortness of breath and wheezing. Negative for hemoptysis, sputum production and stridor.   Cardiovascular: Negative for chest pain, palpitations, orthopnea, leg swelling and PND.  Gastrointestinal: Negative for abdominal pain, constipation, diarrhea, heartburn, nausea and vomiting.  Genitourinary: Negative for dysuria, hematuria and urgency.  Musculoskeletal: Negative for joint pain and myalgias.  Skin: Negative for itching and rash.  Neurological: Negative for dizziness, tingling, weakness and headaches.  Endo/Heme/Allergies: Negative for environmental allergies. Does not bruise/bleed easily.  Psychiatric/Behavioral: Negative for depression. The patient is not nervous/anxious and does not have insomnia.   All other systems reviewed and are negative.    Objective:  Physical Exam Vitals reviewed.  Constitutional:      General: He is not in acute distress.    Appearance: He is well-developed and well-nourished.  HENT:     Head: Normocephalic and atraumatic.     Mouth/Throat:     Mouth: Oropharynx is clear and moist.  Eyes:     General: No scleral icterus.    Conjunctiva/sclera: Conjunctivae normal.     Pupils: Pupils are equal, round, and reactive to light.  Neck:     Vascular: No JVD.     Trachea: No tracheal deviation.  Cardiovascular:     Rate and Rhythm: Normal rate and regular rhythm.     Pulses: Intact distal pulses.     Heart sounds: Normal heart sounds. No murmur heard.   Pulmonary:     Effort: Pulmonary effort is normal. No tachypnea, accessory muscle usage or respiratory distress.     Breath sounds: Normal breath  sounds. No stridor. No wheezing, rhonchi or rales.  Abdominal:     General: Bowel sounds are normal. There is no distension.     Palpations: Abdomen is soft.     Tenderness: There is no abdominal tenderness.  Musculoskeletal:        General: No tenderness or edema.     Cervical back: Neck supple.  Lymphadenopathy:      Cervical: No cervical adenopathy.  Skin:    General: Skin is warm and dry.     Capillary Refill: Capillary refill takes less than 2 seconds.     Findings: No rash.  Neurological:     Mental Status: He is alert and oriented to person, place, and time.  Psychiatric:        Mood and Affect: Mood and affect normal.        Behavior: Behavior normal.      Vitals:   09/11/20 1334  BP: 136/80  Pulse: 70  Temp: 98 F (36.7 C)  TempSrc: Tympanic  SpO2: 98%  Weight: 184 lb 4 oz (83.6 kg)  Height: 5' 8"  (1.727 m)   98% on RA BMI Readings from Last 3 Encounters:  09/11/20 28.02 kg/m  08/29/20 28.82 kg/m  06/19/20 28.35 kg/m   Wt Readings from Last 3 Encounters:  09/11/20 184 lb 4 oz (83.6 kg)  08/29/20 184 lb (83.5 kg)  06/19/20 181 lb (82.1 kg)     CBC    Component Value Date/Time   WBC 9.5 06/13/2019 1652   RBC 4.72 06/13/2019 1652   HGB 14.6 06/13/2019 1652   HCT 44.0 06/13/2019 1652   PLT 388.0 06/13/2019 1652   MCV 93.2 06/13/2019 1652   MCH 30.8 05/29/2019 0456   MCHC 33.2 06/13/2019 1652   RDW 14.4 06/13/2019 1652   LYMPHSABS 2.9 06/13/2019 1652   MONOABS 0.6 06/13/2019 1652   EOSABS 0.1 06/13/2019 1652   BASOSABS 0.1 06/13/2019 1652    Chest Imaging: Lung cancer screening CT 01/18/2020: Lung RADS 2 centrilobular emphysema, paraseptal emphysema recommending 57-monthfollow-up. The patient's images have been independently reviewed by me.    Pulmonary Functions Testing Results: PFT Results Latest Ref Rng & Units 04/27/2018  FVC-Pre L 3.32  FVC-Predicted Pre % 81  FVC-Post L 3.47  FVC-Predicted Post % 84  Pre FEV1/FVC % % 70  Post FEV1/FCV % % 72  FEV1-Pre L 2.32  FEV1-Predicted Pre % 75  FEV1-Post L 2.50  DLCO uncorrected ml/min/mmHg 20.09  DLCO UNC% % 70  DLVA Predicted % 84  TLC L 5.80  TLC % Predicted % 90  RV % Predicted % 107    FeNO:   Pathology:   Echocardiogram:   Heart Catheterization:     Assessment & Plan:     ICD-10-CM    1. Severe persistent asthma, uncomplicated  JR97.58  2. Ast[place]-COPD [place] ([place]  J44.9   3. Seasonal and perennial allergic rhinitis  J30.89    J30.2   4. Former smoker - Quit 2020  [phone]    Discussion:  This is a 70year old gentleman with severe persistent asthma, former smoker, asthma COPD overlap syndrome.  Currently with severe disease that has required escalation to biologic injections.  Currently managed on Nucala for the past approximate 3 years.  Also using Symbicort twice daily and as needed albuterol.  Nocturnal symptoms are decently controlled.  Only had one exacerbation within the past year requiring prednisone that was about 2 months ago.  Plan: Continue Symbicort, refills  as needed Albuterol as needed, refills as needed Continue Nucala injections as currently scheduled. Patient to let us know if he has any change in his symptomatology or requires prednisone within the next calendar year for any frequent exacerbations. Patient can see [place] in 1 year or as needed based on symptoms.   Current Outpatient Medications:  .  aspirin 81 MG chewable tablet, Chew 1 tablet (81 mg total) by mouth daily., Disp:  , Rfl:  .  atorvastatin (LIPITOR) 80 MG tablet, Take 80 mg by mouth at bedtime., Disp: , Rfl:  .  azelastine (ASTELIN) 0.1 % nasal spray, PLACE 2 SPRAYS IN BOTH NOSTRILS 2 TIMES DAILY., Disp: 30 mL, Rfl: 6 .  benzonatate (TESSALON) 100 MG capsule, TAKE 1 CAPSULE THREE TIMES A DAY AS NEEDED FOR COUGH., Disp: 20 capsule, Rfl: 0 .  budesonide-formoterol (SYMBICORT) 160-4.5 MCG/ACT inhaler, Inhale 2 puffs into the lungs 2 (two) times daily. Uses as needed., Disp: , Rfl:  .  Calcium Carb-Cholecalciferol (CALCIUM 600+D3 PO), Take 1 tablet by mouth daily., Disp: , Rfl:  .  fluticasone (FLONASE) 50 MCG/ACT nasal spray, INHALE 2 SPRAYS IN EACH NOSTRIL DAILY., Disp: 16 g, Rfl: 2 .  ipratropium-albuterol (DUONEB) 0.5-2.5 (3) MG/3ML SOLN, Take 3 mLs by nebulization every 4  (four) hours as needed., Disp: , Rfl:  .  losartan (COZAAR) 25 MG tablet, Take 25 mg by mouth daily., Disp: , Rfl:  .  Mepolizumab (NUCALA) 100 MG/ML SOAJ, Inject into the skin., Disp: , Rfl:  .  Multiple Vitamin (MULTIVITAMIN WITH MINERALS) TABS tablet, Take 1 tablet by mouth daily., Disp: , Rfl:  .  olopatadine (PATANOL) 0.1 % ophthalmic solution, Place 1 drop into both eyes 2 (two) times daily., Disp: 5 mL, Rfl: 5 .  PROAIR HFA 108 (90 Base) MCG/ACT inhaler, INHALE 2 PUFFS EVERY 4 TO 6 HOURS AS NEEDED., Disp: 18 g, Rfl: 1  Current Facility-Administered Medications:  Marland Kitchen  Mepolizumab SOLR 100 mg, 100 mg, Subcutaneous, Q28 days, Mannam, Praveen, MD, 100 mg at 06/21/18 0809 .  Mepolizumab SOLR 100 mg, 100 mg, Subcutaneous, Q28 days, Mannam, Praveen, MD, 100 mg at 09/23/18 1621 .  Mepolizumab SOLR 100 mg, 100 mg, Subcutaneous, Q28 days, Mannam, Praveen, MD, 100 mg at 12/26/18 1411 .  Mepolizumab SOLR 100 mg, 100 mg, Subcutaneous, Q28 days, Mannam, Praveen, MD, 100 mg at 01/23/19 1405 .  Mepolizumab SOLR 100 mg, 100 mg, Subcutaneous, Q28 days, Mannam, Praveen, MD, 100 mg at 02/23/19 1347 .  Mepolizumab SOLR 100 mg, 100 mg, Subcutaneous, Q28 days, Mannam, Praveen, MD, 100 mg at 03/27/19 1359 .  Mepolizumab SOLR 100 mg, 100 mg, Subcutaneous, Q28 days, Mannam, Praveen, MD, 100 mg at 04/27/19 1413 .  Mepolizumab SOLR 100 mg, 100 mg, Subcutaneous, Q28 days, Mannam, Praveen, MD, 100 mg at 07/24/19 1408 .  Mepolizumab SOLR 100 mg, 100 mg, Subcutaneous, Q28 days, Mannam, Praveen, MD, 100 mg at 08/24/19 1337 .  Mepolizumab SOLR 100 mg, 100 mg, Subcutaneous, Q28 days, Mannam, Praveen, MD, 100 mg at 09/21/19 1421 .  Mepolizumab SOLR 100 mg, 100 mg, Subcutaneous, Q28 days, Mannam, Praveen, MD, 100 mg at 12/01/19 1637 .  Mepolizumab SOLR 100 mg, 100 mg, Subcutaneous, Q28 days, Mannam, Praveen, MD, 100 mg at 01/09/20 1314 .  Mepolizumab SOLR 100 mg, 100 mg, Subcutaneous, Q28 days, Mannam, Praveen, MD, 100 mg at  03/13/20 1349 .  Mepolizumab SOLR 100 mg, 100 mg, Subcutaneous, Q28 days, Lauraine Rinne, NP, 100 mg at 07/09/20 1401  Facility-Administered  Medications Ordered in Other Visits:  .  clindamycin (CLEOCIN) 900 mg in dextrose 5 % 50 mL IVPB, 900 mg, Intravenous, 60 min Pre-Op **AND** gentamicin (GARAMYCIN) 390 mg in dextrose 5 % 50 mL IVPB, 5 mg/kg, Intravenous, 60 min Pre-Op, Stark Klein, MD  I spent 30 minutes dedicated to the care of this patient on the date of this encounter to include pre-visit review of records, face-to-face time with the patient discussing conditions above, post visit ordering of testing, clinical documentation with the electronic health record, making appropriate referrals as documented, and communicating necessary findings to members of the patients care team.   Garner Nash, [facility] 09/11/2020 1:40 PM

## 2020-09-20 ENCOUNTER — Ambulatory Visit (INDEPENDENT_AMBULATORY_CARE_PROVIDER_SITE_OTHER): Payer: Medicare Other

## 2020-09-20 DIAGNOSIS — J309 Allergic rhinitis, unspecified: Secondary | ICD-10-CM | POA: Diagnosis not present

## 2020-09-23 ENCOUNTER — Telehealth: Payer: Self-pay | Admitting: Pulmonary Disease

## 2020-09-23 NOTE — Telephone Encounter (Signed)
Nucala Order: 166m #1 Vial Order Date: 09/23/20 Expected date of arrival: 09/24/20 Ordered by: LMaitland BNigel Mormon

## 2020-09-24 NOTE — Telephone Encounter (Signed)
Nucala Shipment Received: 17m #1 vial Medication arrival date: 09/24/20 [id number] 12/08/2023 Received by: LElliot Dally

## 2020-09-25 LAB — CUP PACEART REMOTE DEVICE CHECK
Date Time Interrogation Session: 20220213230519
Implantable Pulse Generator Implant Date: 20210107

## 2020-09-30 ENCOUNTER — Ambulatory Visit (INDEPENDENT_AMBULATORY_CARE_PROVIDER_SITE_OTHER): Payer: Medicare Other

## 2020-09-30 DIAGNOSIS — I639 Cerebral infarction, unspecified: Secondary | ICD-10-CM | POA: Diagnosis not present

## 2020-10-03 ENCOUNTER — Telehealth: Payer: Self-pay | Admitting: Pulmonary Disease

## 2020-10-03 NOTE — Telephone Encounter (Signed)
Called and spoke with Patient. Patient scheduled Nucala injection at 1130, 10/17/20.

## 2020-10-04 ENCOUNTER — Ambulatory Visit: Payer: Medicare Other

## 2020-10-04 NOTE — Progress Notes (Signed)
Carelink Summary Report / Loop Recorder 

## 2020-10-06 ENCOUNTER — Other Ambulatory Visit (HOSPITAL_COMMUNITY): Payer: Self-pay | Admitting: Pharmacy Technician

## 2020-10-17 ENCOUNTER — Ambulatory Visit (INDEPENDENT_AMBULATORY_CARE_PROVIDER_SITE_OTHER): Payer: Medicare Other

## 2020-10-17 ENCOUNTER — Other Ambulatory Visit: Payer: Self-pay

## 2020-10-17 ENCOUNTER — Telehealth: Payer: Self-pay | Admitting: Pulmonary Disease

## 2020-10-17 DIAGNOSIS — J4550 Severe persistent asthma, uncomplicated: Secondary | ICD-10-CM

## 2020-10-17 MED ORDER — MEPOLIZUMAB 100 MG ~~LOC~~ SOLR
100.0000 mg | Freq: Once | SUBCUTANEOUS | Status: AC
  Administered 2020-10-17: 100 mg via SUBCUTANEOUS

## 2020-10-17 NOTE — Telephone Encounter (Signed)
Patient stated he received call from infusion clinic about injection. Patient is buy and bill, but said if his insurance would allow, he would like to learn self administration. 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 I did show Patient Nucala pen and explained how to self administer. Patient is interested, if insurance allows.   Message routed to Pharmacy Team

## 2020-10-17 NOTE — Telephone Encounter (Signed)
ERROR

## 2020-10-17 NOTE — Progress Notes (Signed)
Have you been hospitalized within the last 10 days?  No Do you have a fever?  No Do you have a cough?  No Do you have a headache or sore throat? No  

## 2020-10-17 NOTE — Telephone Encounter (Signed)
Submitted a Prior Authorization request to [place] for [place] (via fax) Will update once we receive a response.

## 2020-10-23 ENCOUNTER — Ambulatory Visit (INDEPENDENT_AMBULATORY_CARE_PROVIDER_SITE_OTHER): Payer: Medicare Other | Admitting: *Deleted

## 2020-10-23 DIAGNOSIS — J309 Allergic rhinitis, unspecified: Secondary | ICD-10-CM

## 2020-10-25 ENCOUNTER — Telehealth: Payer: Self-pay | Admitting: Pharmacy Technician

## 2020-10-25 NOTE — Telephone Encounter (Signed)
Self inject patient.

## 2020-10-25 NOTE — Telephone Encounter (Signed)
Patient faxed in PAP application. Awaiting provider form for Nucala pen injectors. Was placed in MD's box.

## 2020-10-25 NOTE — Telephone Encounter (Signed)
Patient will self inject

## 2020-11-03 LAB — CUP PACEART REMOTE DEVICE CHECK
Date Time Interrogation Session: 20220318230354
Implantable Pulse Generator Implant Date: 20210107

## 2020-11-04 ENCOUNTER — Ambulatory Visit (INDEPENDENT_AMBULATORY_CARE_PROVIDER_SITE_OTHER): Payer: Medicare Other

## 2020-11-04 DIAGNOSIS — I639 Cerebral infarction, unspecified: Secondary | ICD-10-CM

## 2020-11-06 ENCOUNTER — Telehealth: Payer: Self-pay | Admitting: Pulmonary Disease

## 2020-11-06 NOTE — Telephone Encounter (Signed)
Spoke to patient, advised PAP application was placed in MD box for signature. Patient will need 1 more visit with infusion team, while we work on paperwork. Sent message.

## 2020-11-08 NOTE — Telephone Encounter (Signed)
Submitted Patient Assistance Application to [facility] [place] for [place] [employer] along with provider portion. Will update patient when we receive a response.  Fax# [phone] Phone# [phone]

## 2020-11-14 ENCOUNTER — Other Ambulatory Visit: Payer: Self-pay

## 2020-11-14 ENCOUNTER — Ambulatory Visit (INDEPENDENT_AMBULATORY_CARE_PROVIDER_SITE_OTHER): Payer: Medicare Other

## 2020-11-14 VITALS — BP 121/80 | HR 70 | Temp 97.6°F | Resp 14

## 2020-11-14 DIAGNOSIS — J4550 Severe persistent asthma, uncomplicated: Secondary | ICD-10-CM

## 2020-11-14 MED ORDER — METHYLPREDNISOLONE SODIUM SUCC 125 MG IJ SOLR: 125.0000 mg | Freq: Once | INTRAMUSCULAR | Status: DC | PRN

## 2020-11-14 MED ORDER — SODIUM CHLORIDE 0.9 % IV SOLN: Freq: Once | INTRAVENOUS | Status: DC | PRN

## 2020-11-14 MED ORDER — EPINEPHRINE 0.3 MG/0.3ML IJ SOAJ: 0.3000 mg | Freq: Once | INTRAMUSCULAR | Status: DC | PRN

## 2020-11-14 MED ORDER — MEPOLIZUMAB 100 MG/ML ~~LOC~~ SOSY
100.0000 mg | PREFILLED_SYRINGE | Freq: Once | SUBCUTANEOUS | Status: AC
  Administered 2020-11-14: 100 mg via SUBCUTANEOUS
  Filled 2020-11-14: qty 1

## 2020-11-14 MED ORDER — DIPHENHYDRAMINE HCL 50 MG/ML IJ SOLN: 50.0000 mg | Freq: Once | INTRAMUSCULAR | Status: DC | PRN

## 2020-11-14 MED ORDER — ALBUTEROL SULFATE HFA 108 (90 BASE) MCG/ACT IN AERS: 2.0000 | INHALATION_SPRAY | Freq: Once | RESPIRATORY_TRACT | Status: DC | PRN

## 2020-11-14 MED ORDER — FAMOTIDINE IN NACL 20-0.9 MG/50ML-% IV SOLN: 20.0000 mg | Freq: Once | INTRAVENOUS | Status: DC | PRN

## 2020-11-14 NOTE — Progress Notes (Signed)
Diagnosis: Asthma  Provider:  Marshell Garfinkel, MD  Procedure: Injection  Nucala (Mepolizumab), Dose: 100 mg, Site: subcutaneous  Discharge: Condition: Good, Destination: Home . AVS provided to patient.   Performed by:  Paul Dykes, RN

## 2020-11-15 ENCOUNTER — Other Ambulatory Visit (HOSPITAL_COMMUNITY): Payer: Self-pay

## 2020-11-15 NOTE — Telephone Encounter (Signed)
Called [facility] [place], they were unable to verify patient pharmacy benefits. Will need to do a conference call with the patient's plan to verify benefits. Will await call.  Previously called insurance- Librarian, academic is a plan exclusion.  Patient's insurance info-  [employer]  [phone] ID- [id number] [phone]  No PCN  Phone# [phone]

## 2020-11-18 NOTE — Progress Notes (Signed)
Carelink Summary Report / Loop Recorder 

## 2020-11-18 NOTE — Telephone Encounter (Signed)
Called [facility] [place], spoke to Rep, Genia Del, she was unable to speak to Pharmacy Dispensing optician. And was advised that patient must call to give consent for them to verify benefit.  Called patient and provided information. He will call me back once complete.  Rx# [phone]  Nucala# [phone]

## 2020-11-20 ENCOUNTER — Ambulatory Visit (INDEPENDENT_AMBULATORY_CARE_PROVIDER_SITE_OTHER): Payer: Medicare Other

## 2020-11-20 DIAGNOSIS — J309 Allergic rhinitis, unspecified: Secondary | ICD-10-CM | POA: Diagnosis not present

## 2020-11-20 NOTE — Telephone Encounter (Signed)
Called pt and verified that he has followed up and provided consent. Spoke with Psychologist, forensic at [employer] to [place] and informed her of this--she will begin processing and should have a resolution in 24-48 hours.

## 2020-11-26 ENCOUNTER — Other Ambulatory Visit (HOSPITAL_COMMUNITY): Payer: Self-pay

## 2020-11-26 NOTE — Telephone Encounter (Signed)
Called [facility] [place] to follow up on Nucala benefits investigation. Rep advised that they were able to verify benefits for Nucala Pen.  Received notification from [facility] [place] for [facility] PEN patient assistance, patient's application has been DENIED due to patient's pharmacy benefit is a Nature conservation officer. Rep says although there is no coverage for Nucala Pen through pharmacy benefit, patient is not eligible.   Patient has Medicare and Secondary that will cover in-office injections.    [facility] [place] Phone# [phone]  Called patient and discussed. He is fine with continuing to receive via the infusion center.  Routing to infusion team to advise.

## 2020-11-27 ENCOUNTER — Ambulatory Visit (INDEPENDENT_AMBULATORY_CARE_PROVIDER_SITE_OTHER): Payer: Medicare Other | Admitting: *Deleted

## 2020-11-27 DIAGNOSIS — J309 Allergic rhinitis, unspecified: Secondary | ICD-10-CM

## 2020-12-06 ENCOUNTER — Ambulatory Visit (INDEPENDENT_AMBULATORY_CARE_PROVIDER_SITE_OTHER): Payer: Medicare Other

## 2020-12-06 DIAGNOSIS — J309 Allergic rhinitis, unspecified: Secondary | ICD-10-CM | POA: Diagnosis not present

## 2020-12-09 ENCOUNTER — Telehealth: Payer: Self-pay

## 2020-12-09 ENCOUNTER — Ambulatory Visit: Payer: Medicare Other

## 2020-12-09 ENCOUNTER — Ambulatory Visit (INDEPENDENT_AMBULATORY_CARE_PROVIDER_SITE_OTHER): Payer: Medicare Other

## 2020-12-09 DIAGNOSIS — I639 Cerebral infarction, unspecified: Secondary | ICD-10-CM

## 2020-12-10 LAB — CUP PACEART REMOTE DEVICE CHECK
Date Time Interrogation Session: 20220430230132
Implantable Pulse Generator Implant Date: 20210107

## 2020-12-11 ENCOUNTER — Ambulatory Visit: Payer: Medicare Other

## 2020-12-11 ENCOUNTER — Ambulatory Visit (INDEPENDENT_AMBULATORY_CARE_PROVIDER_SITE_OTHER): Payer: Medicare Other

## 2020-12-11 DIAGNOSIS — J309 Allergic rhinitis, unspecified: Secondary | ICD-10-CM

## 2020-12-16 ENCOUNTER — Other Ambulatory Visit: Payer: Self-pay

## 2020-12-16 ENCOUNTER — Ambulatory Visit (INDEPENDENT_AMBULATORY_CARE_PROVIDER_SITE_OTHER): Payer: Medicare Other | Admitting: *Deleted

## 2020-12-16 VITALS — BP 120/76 | HR 72 | Temp 98.0°F | Resp 20

## 2020-12-16 DIAGNOSIS — J4550 Severe persistent asthma, uncomplicated: Secondary | ICD-10-CM

## 2020-12-16 MED ORDER — DIPHENHYDRAMINE HCL 50 MG/ML IJ SOLN: 50.0000 mg | Freq: Once | INTRAMUSCULAR | Status: DC | PRN

## 2020-12-16 MED ORDER — SODIUM CHLORIDE 0.9 % IV SOLN: Freq: Once | INTRAVENOUS | Status: DC | PRN

## 2020-12-16 MED ORDER — FAMOTIDINE IN NACL 20-0.9 MG/50ML-% IV SOLN: 20.0000 mg | Freq: Once | INTRAVENOUS | Status: DC | PRN

## 2020-12-16 MED ORDER — METHYLPREDNISOLONE SODIUM SUCC 125 MG IJ SOLR: 125.0000 mg | Freq: Once | INTRAMUSCULAR | Status: DC | PRN

## 2020-12-16 MED ORDER — MEPOLIZUMAB 100 MG/ML ~~LOC~~ SOSY
100.0000 mg | PREFILLED_SYRINGE | Freq: Once | SUBCUTANEOUS | Status: AC
  Administered 2020-12-16: 100 mg via SUBCUTANEOUS
  Filled 2020-12-16: qty 1

## 2020-12-16 MED ORDER — EPINEPHRINE 0.3 MG/0.3ML IJ SOAJ: 0.3000 mg | Freq: Once | INTRAMUSCULAR | Status: DC | PRN

## 2020-12-16 MED ORDER — ALBUTEROL SULFATE HFA 108 (90 BASE) MCG/ACT IN AERS: 2.0000 | INHALATION_SPRAY | Freq: Once | RESPIRATORY_TRACT | Status: DC | PRN

## 2020-12-16 NOTE — Progress Notes (Signed)
Diagnosis: Asthma  Provider:  Marshell Garfinkel, MD  Procedure: Injection  Nucala (Mepolizumab), Dose: 100 mg, Site: subcutaneous  Discharge: Condition: Good, Destination: Home . AVS provided to patient.   Performed by:  Bonnye Fava, RN

## 2020-12-18 ENCOUNTER — Ambulatory Visit (INDEPENDENT_AMBULATORY_CARE_PROVIDER_SITE_OTHER): Payer: Medicare Other

## 2020-12-18 DIAGNOSIS — J309 Allergic rhinitis, unspecified: Secondary | ICD-10-CM

## 2020-12-31 NOTE — Progress Notes (Signed)
Carelink Summary Report / Loop Recorder 

## 2021-01-10 ENCOUNTER — Other Ambulatory Visit: Payer: Self-pay | Admitting: Allergy & Immunology

## 2021-01-10 NOTE — Telephone Encounter (Signed)
Please advise to refills of tessalons

## 2021-01-12 LAB — CUP PACEART REMOTE DEVICE CHECK
Date Time Interrogation Session: 20220602230415
Implantable Pulse Generator Implant Date: 20210107

## 2021-01-13 ENCOUNTER — Ambulatory Visit (INDEPENDENT_AMBULATORY_CARE_PROVIDER_SITE_OTHER): Payer: Medicare Other

## 2021-01-13 DIAGNOSIS — I639 Cerebral infarction, unspecified: Secondary | ICD-10-CM

## 2021-01-13 NOTE — Telephone Encounter (Signed)
Spoke to the patient and he has been coughing up green phlegm since the pollen has arrived. [place] problem. Patient states the tessalon pearls help tremendously. Patient uses Personal assistant. Please call Patient if Dr. Ernst Bowler ok's refill.

## 2021-01-15 ENCOUNTER — Ambulatory Visit (INDEPENDENT_AMBULATORY_CARE_PROVIDER_SITE_OTHER): Payer: Medicare Other

## 2021-01-15 DIAGNOSIS — J309 Allergic rhinitis, unspecified: Secondary | ICD-10-CM

## 2021-01-21 NOTE — Progress Notes (Signed)
Diagnosis: Asthma  Provider:  Marshell Garfinkel, MD  Procedure: Injection  Nucala (Mepolizumab), Dose: 100 mg, Site: subcutaneous  Discharge: Condition: Good, Destination: Home . AVS provided to patient.   Performed by:  Rosita Kea RN

## 2021-01-21 NOTE — Telephone Encounter (Signed)
Tessalon pearls were sent into patients pharmacy on 01/14/2021 and patient has picked them up.

## 2021-01-22 ENCOUNTER — Telehealth: Payer: Self-pay

## 2021-01-22 NOTE — Telephone Encounter (Signed)
Patient scheduled for annual lung screening CT scan on Tuesday June 21 @ 1:30

## 2021-01-23 ENCOUNTER — Other Ambulatory Visit: Payer: Self-pay | Admitting: *Deleted

## 2021-01-23 DIAGNOSIS — Z122 Encounter for screening for malignant neoplasm of respiratory organs: Secondary | ICD-10-CM

## 2021-01-23 DIAGNOSIS — Z87891 Personal history of nicotine dependence: Secondary | ICD-10-CM

## 2021-01-23 NOTE — Progress Notes (Signed)
Contacted and scheduled for annual lung screening scan. Patient is a former smoker, quit 2020, 153 pack year history.

## 2021-01-24 ENCOUNTER — Ambulatory Visit (INDEPENDENT_AMBULATORY_CARE_PROVIDER_SITE_OTHER): Payer: Medicare Other | Admitting: *Deleted

## 2021-01-24 ENCOUNTER — Other Ambulatory Visit: Payer: Self-pay

## 2021-01-24 VITALS — BP 136/92 | HR 75 | Temp 97.9°F | Resp 20

## 2021-01-24 DIAGNOSIS — J4550 Severe persistent asthma, uncomplicated: Secondary | ICD-10-CM | POA: Diagnosis not present

## 2021-01-24 MED ORDER — ALBUTEROL SULFATE HFA 108 (90 BASE) MCG/ACT IN AERS: 2.0000 | INHALATION_SPRAY | Freq: Once | RESPIRATORY_TRACT | Status: DC | PRN

## 2021-01-24 MED ORDER — SODIUM CHLORIDE 0.9 % IV SOLN: Freq: Once | INTRAVENOUS | Status: DC | PRN

## 2021-01-24 MED ORDER — DIPHENHYDRAMINE HCL 50 MG/ML IJ SOLN: 50.0000 mg | Freq: Once | INTRAMUSCULAR | Status: DC | PRN

## 2021-01-24 MED ORDER — METHYLPREDNISOLONE SODIUM SUCC 125 MG IJ SOLR: 125.0000 mg | Freq: Once | INTRAMUSCULAR | Status: DC | PRN

## 2021-01-24 MED ORDER — FAMOTIDINE IN NACL 20-0.9 MG/50ML-% IV SOLN: 20.0000 mg | Freq: Once | INTRAVENOUS | Status: DC | PRN

## 2021-01-24 MED ORDER — MEPOLIZUMAB 100 MG/ML ~~LOC~~ SOSY
100.0000 mg | PREFILLED_SYRINGE | Freq: Once | SUBCUTANEOUS | Status: AC
  Administered 2021-01-24: 100 mg via SUBCUTANEOUS
  Filled 2021-01-24: qty 1

## 2021-01-24 MED ORDER — EPINEPHRINE 0.3 MG/0.3ML IJ SOAJ: 0.3000 mg | Freq: Once | INTRAMUSCULAR | Status: DC | PRN

## 2021-01-24 NOTE — Progress Notes (Signed)
Diagnosis: Asthma  Provider:  Marshell Garfinkel, MD  Procedure: Injection  Nucala (Mepolizumab), Dose: 100 mg, Site: subcutaneous  Discharge: Condition: Good, Destination: Home . AVS provided to patient.   Performed by:  Oren Beckmann, RN

## 2021-01-28 ENCOUNTER — Other Ambulatory Visit: Payer: Self-pay

## 2021-01-28 ENCOUNTER — Ambulatory Visit
Admission: RE | Admit: 2021-01-28 | Discharge: 2021-01-28 | Disposition: A | Discharge status: Home / Self Care | Payer: Medicare Other | Source: Ambulatory Visit | Attending: Nurse Practitioner | Admitting: Nurse Practitioner

## 2021-01-28 DIAGNOSIS — Z87891 Personal history of nicotine dependence: Secondary | ICD-10-CM | POA: Insufficient documentation

## 2021-01-28 DIAGNOSIS — Z122 Encounter for screening for malignant neoplasm of respiratory organs: Secondary | ICD-10-CM | POA: Insufficient documentation

## 2021-02-04 ENCOUNTER — Other Ambulatory Visit: Payer: Self-pay | Admitting: Allergy & Immunology

## 2021-02-04 NOTE — Progress Notes (Signed)
Carelink Summary Report / Loop Recorder 

## 2021-02-12 ENCOUNTER — Ambulatory Visit (INDEPENDENT_AMBULATORY_CARE_PROVIDER_SITE_OTHER): Payer: Medicare Other

## 2021-02-12 DIAGNOSIS — J309 Allergic rhinitis, unspecified: Secondary | ICD-10-CM

## 2021-02-17 ENCOUNTER — Ambulatory Visit (INDEPENDENT_AMBULATORY_CARE_PROVIDER_SITE_OTHER): Payer: Medicare Other

## 2021-02-17 DIAGNOSIS — I639 Cerebral infarction, unspecified: Secondary | ICD-10-CM | POA: Diagnosis not present

## 2021-02-17 LAB — CUP PACEART REMOTE DEVICE CHECK
Date Time Interrogation Session: 20220705230921
Implantable Pulse Generator Implant Date: 20210107

## 2021-02-18 ENCOUNTER — Other Ambulatory Visit: Payer: Self-pay | Admitting: Allergy & Immunology

## 2021-02-18 NOTE — Telephone Encounter (Signed)
Called the patient to let him know Dr. Ernst Bowler sent in a 30 day courtesy refill for azelastine nasal spray. I left a message for him to call the office back to schedule a follow up appointment.

## 2021-02-18 NOTE — Telephone Encounter (Signed)
Patient was last seen in November of 2021 and was due back for a follow up visit may of 2022.

## 2021-02-19 ENCOUNTER — Encounter: Payer: Self-pay | Admitting: *Deleted

## 2021-02-21 ENCOUNTER — Other Ambulatory Visit: Payer: Self-pay

## 2021-02-21 ENCOUNTER — Ambulatory Visit (INDEPENDENT_AMBULATORY_CARE_PROVIDER_SITE_OTHER): Payer: Medicare Other

## 2021-02-21 VITALS — BP 127/80 | HR 68 | Temp 98.1°F | Resp 16

## 2021-02-21 DIAGNOSIS — J4550 Severe persistent asthma, uncomplicated: Secondary | ICD-10-CM

## 2021-02-21 MED ORDER — SODIUM CHLORIDE 0.9 % IV SOLN: Freq: Once | INTRAVENOUS | Status: DC | PRN

## 2021-02-21 MED ORDER — ALBUTEROL SULFATE HFA 108 (90 BASE) MCG/ACT IN AERS: 2.0000 | INHALATION_SPRAY | Freq: Once | RESPIRATORY_TRACT | Status: DC | PRN

## 2021-02-21 MED ORDER — FAMOTIDINE IN NACL 20-0.9 MG/50ML-% IV SOLN: 20.0000 mg | Freq: Once | INTRAVENOUS | Status: DC | PRN

## 2021-02-21 MED ORDER — DIPHENHYDRAMINE HCL 50 MG/ML IJ SOLN: 50.0000 mg | Freq: Once | INTRAMUSCULAR | Status: DC | PRN

## 2021-02-21 MED ORDER — MEPOLIZUMAB 100 MG/ML ~~LOC~~ SOSY
100.0000 mg | PREFILLED_SYRINGE | Freq: Once | SUBCUTANEOUS | Status: AC
  Administered 2021-02-21: 100 mg via SUBCUTANEOUS
  Filled 2021-02-21: qty 1

## 2021-02-21 MED ORDER — METHYLPREDNISOLONE SODIUM SUCC 125 MG IJ SOLR: 125.0000 mg | Freq: Once | INTRAMUSCULAR | Status: DC | PRN

## 2021-02-21 MED ORDER — EPINEPHRINE 0.3 MG/0.3ML IJ SOAJ: 0.3000 mg | Freq: Once | INTRAMUSCULAR | Status: DC | PRN

## 2021-02-21 NOTE — Progress Notes (Signed)
Diagnosis: Asthma  Provider:  Marshell Garfinkel, MD  Procedure: Injection  Nucala (Mepolizumab), Dose: 100 mg, Site: subcutaneous left arm  Discharge: Condition: Good, Destination: Home . AVS provided to patient.   Performed by:  Gustavia Carie, Sherlon Handing, LPN

## 2021-02-26 ENCOUNTER — Telehealth: Payer: Self-pay | Admitting: Pharmacy Technician

## 2021-02-26 NOTE — Telephone Encounter (Signed)
(  Fyi) spoke with patient in regards to change in formulation of nucala (vials). Patient will call 45 min prior to all future appt to confirm.

## 2021-03-12 NOTE — Progress Notes (Signed)
Carelink Summary Report / Loop Recorder 

## 2021-03-19 ENCOUNTER — Ambulatory Visit (INDEPENDENT_AMBULATORY_CARE_PROVIDER_SITE_OTHER): Payer: Medicare Other | Admitting: *Deleted

## 2021-03-19 DIAGNOSIS — J309 Allergic rhinitis, unspecified: Secondary | ICD-10-CM

## 2021-03-21 ENCOUNTER — Ambulatory Visit (INDEPENDENT_AMBULATORY_CARE_PROVIDER_SITE_OTHER): Payer: Medicare Other

## 2021-03-21 ENCOUNTER — Other Ambulatory Visit: Payer: Self-pay

## 2021-03-21 VITALS — BP 152/93 | HR 83 | Temp 98.4°F | Resp 16

## 2021-03-21 DIAGNOSIS — J4550 Severe persistent asthma, uncomplicated: Secondary | ICD-10-CM

## 2021-03-21 MED ORDER — METHYLPREDNISOLONE SODIUM SUCC 125 MG IJ SOLR: 125.0000 mg | Freq: Once | INTRAMUSCULAR | Status: DC | PRN

## 2021-03-21 MED ORDER — DIPHENHYDRAMINE HCL 50 MG/ML IJ SOLN: 50.0000 mg | Freq: Once | INTRAMUSCULAR | Status: DC | PRN

## 2021-03-21 MED ORDER — ALBUTEROL SULFATE HFA 108 (90 BASE) MCG/ACT IN AERS: 2.0000 | INHALATION_SPRAY | Freq: Once | RESPIRATORY_TRACT | Status: DC | PRN

## 2021-03-21 MED ORDER — MEPOLIZUMAB 100 MG ~~LOC~~ SOLR
100.0000 mg | Freq: Once | SUBCUTANEOUS | Status: AC
  Administered 2021-03-21: 100 mg via SUBCUTANEOUS
  Filled 2021-03-21: qty 1

## 2021-03-21 MED ORDER — SODIUM CHLORIDE 0.9 % IV SOLN: Freq: Once | INTRAVENOUS | Status: DC | PRN

## 2021-03-21 MED ORDER — EPINEPHRINE 0.3 MG/0.3ML IJ SOAJ: 0.3000 mg | Freq: Once | INTRAMUSCULAR | Status: DC | PRN

## 2021-03-21 MED ORDER — FAMOTIDINE IN NACL 20-0.9 MG/50ML-% IV SOLN: 20.0000 mg | Freq: Once | INTRAVENOUS | Status: DC | PRN

## 2021-03-21 NOTE — Progress Notes (Signed)
Diagnosis: Asthma  Provider:  Marshell Garfinkel, MD  Procedure: Injection  Nucala (Mepolizumab), Dose: 100 mg, Site: subcutaneous  Discharge: Condition: Good, Destination: Home . AVS provided to patient.   Performed by:  Charlie Pitter, RN

## 2021-03-24 ENCOUNTER — Ambulatory Visit (INDEPENDENT_AMBULATORY_CARE_PROVIDER_SITE_OTHER): Payer: Medicare Other

## 2021-03-24 DIAGNOSIS — I639 Cerebral infarction, unspecified: Secondary | ICD-10-CM

## 2021-03-25 LAB — CUP PACEART REMOTE DEVICE CHECK
Date Time Interrogation Session: 20220807230055
Implantable Pulse Generator Implant Date: 20210107

## 2021-04-03 ENCOUNTER — Encounter: Payer: Self-pay | Admitting: Pulmonary Disease

## 2021-04-09 ENCOUNTER — Ambulatory Visit: Payer: Medicare Other | Admitting: Dermatology

## 2021-04-12 NOTE — Progress Notes (Signed)
Carelink Summary Report / Loop Recorder 

## 2021-04-16 ENCOUNTER — Ambulatory Visit: Payer: Self-pay

## 2021-04-16 ENCOUNTER — Ambulatory Visit (INDEPENDENT_AMBULATORY_CARE_PROVIDER_SITE_OTHER): Payer: Medicare Other | Admitting: Allergy & Immunology

## 2021-04-16 ENCOUNTER — Encounter: Payer: Self-pay | Admitting: Allergy & Immunology

## 2021-04-16 ENCOUNTER — Other Ambulatory Visit: Payer: Self-pay

## 2021-04-16 VITALS — BP 124/80 | HR 77 | Temp 98.1°F | Resp 16 | Ht 68.0 in | Wt 179.6 lb

## 2021-04-16 DIAGNOSIS — I639 Cerebral infarction, unspecified: Secondary | ICD-10-CM

## 2021-04-16 DIAGNOSIS — J3089 Other allergic rhinitis: Secondary | ICD-10-CM

## 2021-04-16 DIAGNOSIS — J302 Other seasonal allergic rhinitis: Secondary | ICD-10-CM

## 2021-04-16 DIAGNOSIS — J4550 Severe persistent asthma, uncomplicated: Secondary | ICD-10-CM | POA: Diagnosis not present

## 2021-04-16 DIAGNOSIS — J309 Allergic rhinitis, unspecified: Secondary | ICD-10-CM | POA: Diagnosis not present

## 2021-04-16 MED ORDER — AZELASTINE HCL 137 MCG/SPRAY NA SOLN
NASAL | 5 refills | Status: DC
Start: 2021-04-16 — End: 2021-10-24

## 2021-04-16 MED ORDER — IPRATROPIUM-ALBUTEROL 0.5-2.5 (3) MG/3ML IN SOLN: 3.0000 mL | RESPIRATORY_TRACT | 2 refills | Status: AC | PRN

## 2021-04-16 MED ORDER — FLUTICASONE PROPIONATE 50 MCG/ACT NA SUSP: 2.0000 | Freq: Every day | NASAL | 5 refills | Status: DC

## 2021-04-16 MED ORDER — ALBUTEROL SULFATE HFA 108 (90 BASE) MCG/ACT IN AERS: INHALATION_SPRAY | RESPIRATORY_TRACT | 1 refills | Status: DC

## 2021-04-16 MED ORDER — OLOPATADINE HCL 0.1 % OP SOLN: 1.0000 [drp] | Freq: Two times a day (BID) | OPHTHALMIC | 5 refills | Status: DC

## 2021-04-16 NOTE — Patient Instructions (Addendum)
1. Severe persistent asthma, uncomplicated - Spirometry looks fairly stable.  - We are going to start Breztri two puffs twice daily (this will replace Symbicort and the Spiriva) - Daily controller medication(s): Breztri two puffs twice daily with spacer + Singulair (montelukast) 7m daily + Nucala 1019mmonthly - Rescue medications: albuterol 4 puffs every 4-6 hours as needed - Asthma control goals:  * Full participation in all desired activities (may need albuterol before activity) * Albuterol use two time or less a week on average (not counting use with activity) * Cough interfering with sleep two time or less a month * Oral steroids no more than once a year * No hospitalizations  2.Chronic nonseasonal allergic rhinitis - Stop the azelastine and see if your nose can become more responsive to it over time (we COULD change to Patanase instead but rarely insurances cover this).  - Continue with Flonase one spray per nostril daily. - Continue with allergy shots at the same schedule.   3. Return in about 6 months (around 10/14/2021).    Please inform usKoreaf any Emergency Department visits, hospitalizations, or changes in symptoms. Call usKoreaefore going to the ED for breathing or allergy symptoms since we might be able to fit you in for a sick visit. Feel free to contact usKoreanytime with any questions, problems, or concerns.  It was a pleasure to see you again today! Glad you are making such a speedy recovery!   Websites that have reliable patient information: 1. American Academy of Asthma, Allergy, and Immunology: www.aaaai.org 2. Food Allergy Research and Education (FARE): foodallergy.org 3. Mothers of Asthmatics: http://www.asthmacommunitynetwork.org 4. American College of Allergy, Asthma, and Immunology: www.acaai.org  "Like" usKorean Facebook and Instagram for our latest updates!      Make sure you are registered to vote! If you have moved or changed any of your contact information, you  will need to get this updated before voting!  In some cases, you MAY be able to register to vote online: [email or web address]
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
## 2021-04-18 ENCOUNTER — Ambulatory Visit (INDEPENDENT_AMBULATORY_CARE_PROVIDER_SITE_OTHER): Payer: Medicare Other

## 2021-04-18 ENCOUNTER — Other Ambulatory Visit: Payer: Self-pay

## 2021-04-18 VITALS — BP 122/83 | HR 84 | Temp 98.4°F | Resp 20

## 2021-04-18 DIAGNOSIS — J4550 Severe persistent asthma, uncomplicated: Secondary | ICD-10-CM | POA: Diagnosis not present

## 2021-04-18 MED ORDER — DIPHENHYDRAMINE HCL 50 MG/ML IJ SOLN: 50.0000 mg | Freq: Once | INTRAMUSCULAR | Status: DC | PRN

## 2021-04-18 MED ORDER — SODIUM CHLORIDE 0.9 % IV SOLN: Freq: Once | INTRAVENOUS | Status: DC | PRN

## 2021-04-18 MED ORDER — EPINEPHRINE 0.3 MG/0.3ML IJ SOAJ: 0.3000 mg | Freq: Once | INTRAMUSCULAR | Status: DC | PRN

## 2021-04-18 MED ORDER — METHYLPREDNISOLONE SODIUM SUCC 125 MG IJ SOLR: 125.0000 mg | Freq: Once | INTRAMUSCULAR | Status: DC | PRN

## 2021-04-18 MED ORDER — FAMOTIDINE IN NACL 20-0.9 MG/50ML-% IV SOLN: 20.0000 mg | Freq: Once | INTRAVENOUS | Status: DC | PRN

## 2021-04-18 MED ORDER — MEPOLIZUMAB 100 MG ~~LOC~~ SOLR
100.0000 mg | Freq: Once | SUBCUTANEOUS | Status: AC
  Administered 2021-04-18: 100 mg via SUBCUTANEOUS
  Filled 2021-04-18: qty 1

## 2021-04-18 MED ORDER — ALBUTEROL SULFATE HFA 108 (90 BASE) MCG/ACT IN AERS: 2.0000 | INHALATION_SPRAY | Freq: Once | RESPIRATORY_TRACT | Status: DC | PRN

## 2021-04-18 NOTE — Progress Notes (Addendum)
Diagnosis: Asthma  Provider:  Marshell Garfinkel, MD  Procedure: Injection  Nucala (Mepolizumab), Dose: 100 mg, Site: subcutaneous  Discharge: Condition: Good, Destination: Home . AVS provided to patient.   Performed by:  Isaak Delmundo, Sherlon Handing, LPN

## 2021-04-23 LAB — CUP PACEART REMOTE DEVICE CHECK
Date Time Interrogation Session: 20220909230447
Implantable Pulse Generator Implant Date: 20210107

## 2021-04-28 ENCOUNTER — Ambulatory Visit: Payer: PRIVATE HEALTH INSURANCE

## 2021-04-28 DIAGNOSIS — I639 Cerebral infarction, unspecified: Secondary | ICD-10-CM

## 2021-05-05 NOTE — Progress Notes (Signed)
Carelink Summary Report / Loop Recorder 

## 2021-05-14 ENCOUNTER — Ambulatory Visit (INDEPENDENT_AMBULATORY_CARE_PROVIDER_SITE_OTHER): Payer: Medicare Other

## 2021-05-14 DIAGNOSIS — J309 Allergic rhinitis, unspecified: Secondary | ICD-10-CM

## 2021-05-15 DIAGNOSIS — J3089 Other allergic rhinitis: Secondary | ICD-10-CM

## 2021-05-15 NOTE — Progress Notes (Signed)
VIAL MADE. EXP 05-15-22

## 2021-05-16 ENCOUNTER — Ambulatory Visit (INDEPENDENT_AMBULATORY_CARE_PROVIDER_SITE_OTHER): Payer: Medicare Other

## 2021-05-16 ENCOUNTER — Other Ambulatory Visit: Payer: Self-pay

## 2021-05-16 VITALS — BP 160/91 | HR 82 | Temp 98.4°F | Resp 18 | Ht 67.0 in | Wt 181.0 lb

## 2021-05-16 DIAGNOSIS — J4550 Severe persistent asthma, uncomplicated: Secondary | ICD-10-CM | POA: Diagnosis not present

## 2021-05-16 MED ORDER — FAMOTIDINE IN NACL 20-0.9 MG/50ML-% IV SOLN: 20.0000 mg | Freq: Once | INTRAVENOUS | Status: DC | PRN

## 2021-05-16 MED ORDER — MEPOLIZUMAB 100 MG ~~LOC~~ SOLR
100.0000 mg | Freq: Once | SUBCUTANEOUS | Status: AC
  Administered 2021-05-16: 100 mg via SUBCUTANEOUS
  Filled 2021-05-16: qty 1

## 2021-05-16 MED ORDER — EPINEPHRINE 0.3 MG/0.3ML IJ SOAJ: 0.3000 mg | Freq: Once | INTRAMUSCULAR | Status: DC | PRN

## 2021-05-16 MED ORDER — DIPHENHYDRAMINE HCL 50 MG/ML IJ SOLN: 50.0000 mg | Freq: Once | INTRAMUSCULAR | Status: DC | PRN

## 2021-05-16 MED ORDER — METHYLPREDNISOLONE SODIUM SUCC 125 MG IJ SOLR: 125.0000 mg | Freq: Once | INTRAMUSCULAR | Status: DC | PRN

## 2021-05-16 MED ORDER — SODIUM CHLORIDE 0.9 % IV SOLN: Freq: Once | INTRAVENOUS | Status: DC | PRN

## 2021-05-16 MED ORDER — ALBUTEROL SULFATE HFA 108 (90 BASE) MCG/ACT IN AERS: 2.0000 | INHALATION_SPRAY | Freq: Once | RESPIRATORY_TRACT | Status: DC | PRN

## 2021-05-16 NOTE — Progress Notes (Signed)
Diagnosis: severe persistent asthma  Provider:  Marshell Garfinkel, MD  Procedure: Injection  Nucala (Mepolizumab), Dose: 100 mg, Site: subcutaneous  Discharge: Condition: Good, Destination: Home . AVS provided to patient.   Performed by:  Charlie Pitter, RN

## 2021-05-26 ENCOUNTER — Ambulatory Visit (INDEPENDENT_AMBULATORY_CARE_PROVIDER_SITE_OTHER): Payer: Medicare Other

## 2021-05-26 DIAGNOSIS — I639 Cerebral infarction, unspecified: Secondary | ICD-10-CM | POA: Diagnosis not present

## 2021-05-28 LAB — CUP PACEART REMOTE DEVICE CHECK
Date Time Interrogation Session: 20221012230545
Implantable Pulse Generator Implant Date: 20210107

## 2021-06-04 NOTE — Progress Notes (Signed)
Carelink Summary Report / Loop Recorder 

## 2021-06-11 ENCOUNTER — Ambulatory Visit (INDEPENDENT_AMBULATORY_CARE_PROVIDER_SITE_OTHER): Payer: Medicare Other

## 2021-06-11 DIAGNOSIS — J309 Allergic rhinitis, unspecified: Secondary | ICD-10-CM

## 2021-06-16 ENCOUNTER — Ambulatory Visit (INDEPENDENT_AMBULATORY_CARE_PROVIDER_SITE_OTHER): Payer: Medicare Other

## 2021-06-16 ENCOUNTER — Other Ambulatory Visit: Payer: Self-pay

## 2021-06-16 VITALS — BP 152/88 | HR 60 | Temp 97.7°F | Resp 16 | Ht 68.0 in | Wt 182.0 lb

## 2021-06-16 DIAGNOSIS — J4550 Severe persistent asthma, uncomplicated: Secondary | ICD-10-CM | POA: Diagnosis not present

## 2021-06-16 MED ORDER — MEPOLIZUMAB 100 MG ~~LOC~~ SOLR
100.0000 mg | Freq: Once | SUBCUTANEOUS | Status: AC
  Administered 2021-06-16: 100 mg via SUBCUTANEOUS
  Filled 2021-06-16: qty 1

## 2021-06-16 NOTE — Progress Notes (Signed)
Diagnosis: Asthma  Provider:  Marshell Garfinkel, MD  Procedure: Injection  Nucala (Mepolizumab), Dose: 100 mg, Site: subcutaneous  Discharge: Condition: Good, Destination: Home . AVS provided to patient.   Performed by:  Kashira Behunin, Sherlon Handing, LPN

## 2021-06-25 LAB — CUP PACEART REMOTE DEVICE CHECK
Date Time Interrogation Session: 20221114230350
Implantable Pulse Generator Implant Date: 20210107

## 2021-06-30 ENCOUNTER — Ambulatory Visit (INDEPENDENT_AMBULATORY_CARE_PROVIDER_SITE_OTHER): Payer: Medicare Other

## 2021-06-30 DIAGNOSIS — I639 Cerebral infarction, unspecified: Secondary | ICD-10-CM

## 2021-07-09 ENCOUNTER — Ambulatory Visit (INDEPENDENT_AMBULATORY_CARE_PROVIDER_SITE_OTHER): Payer: Medicare Other

## 2021-07-09 DIAGNOSIS — J309 Allergic rhinitis, unspecified: Secondary | ICD-10-CM

## 2021-07-09 NOTE — Progress Notes (Signed)
Carelink Summary Report / Loop Recorder 

## 2021-07-13 IMAGING — CT CT CHEST LUNG CANCER SCREENING LOW DOSE W/O CM
2 of 5 series · 15 of 40 positions shown, 18 images · non-contrast
Comparison: 01/18/2020

CLINICAL DATA: Ex-smoker, quitting 2 years ago. One hundred
fifty-three pack-year history. Bladder cancer in 7432.

EXAM:
CT CHEST WITHOUT CONTRAST LOW-DOSE FOR LUNG CANCER SCREENING
TECHNIQUE: Multidetector CT imaging of the chest was performed following the
standard protocol without IV contrast.

[Series 3: lung 1.00 · axial · 0.63mm/px · z∈[-1194,-903]mm · 12 of 323 slices shown, 15 images]
[im 16/323  mediastinal]
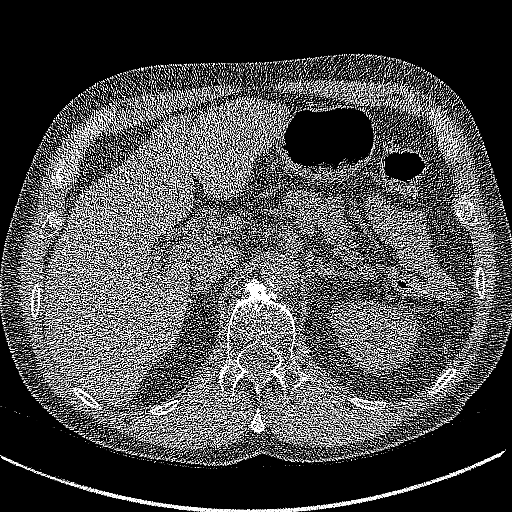
[im 16/323  lung]
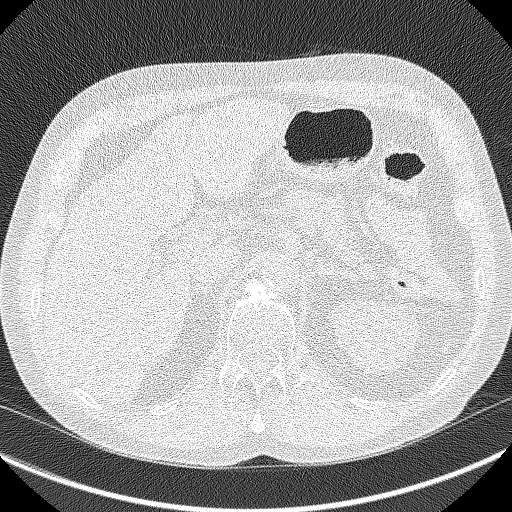
[im 47/323  lung]
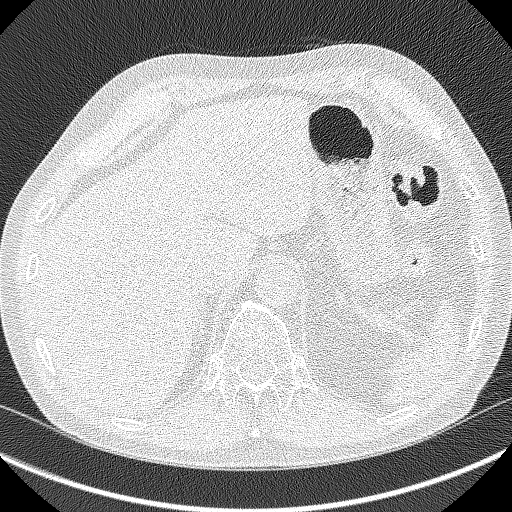
[im 77/323  lung]
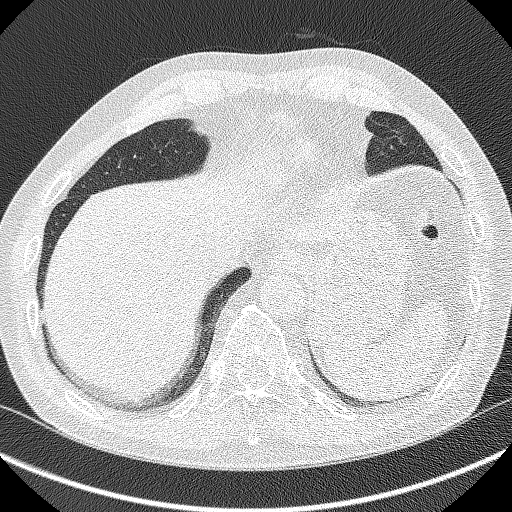
[im 93/323  lung]
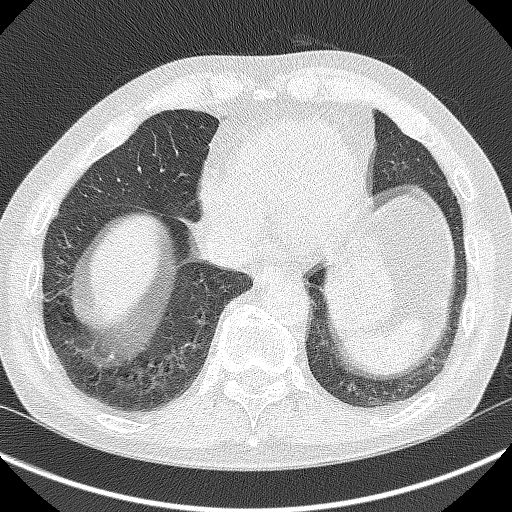
[im 123/323  mediastinal]
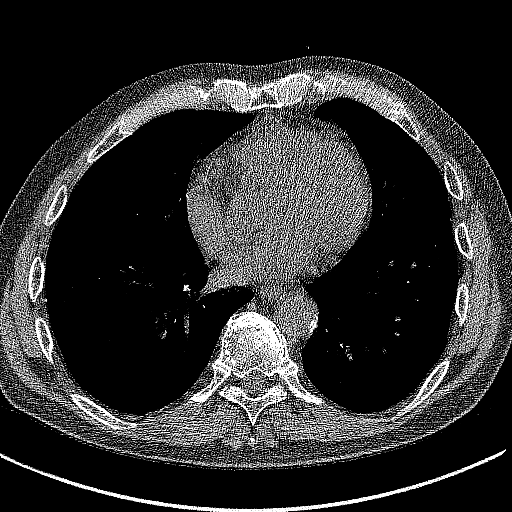
[im 123/323  lung]
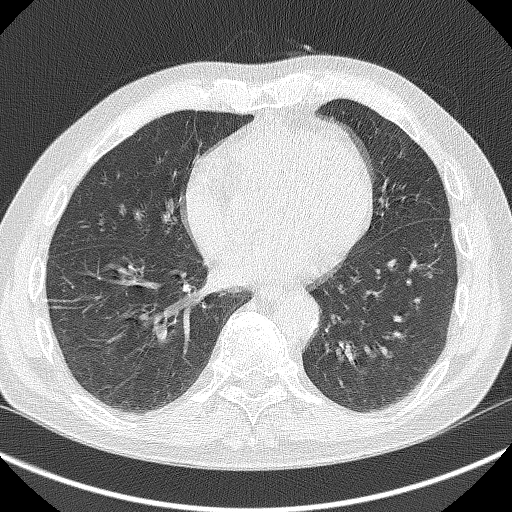
[im 154/323  lung]
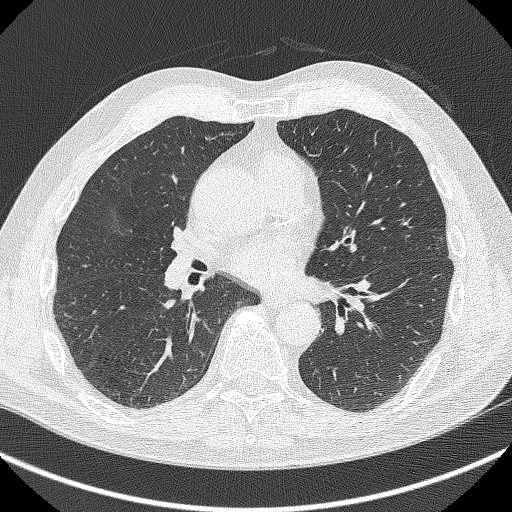
[im 169/323  lung]
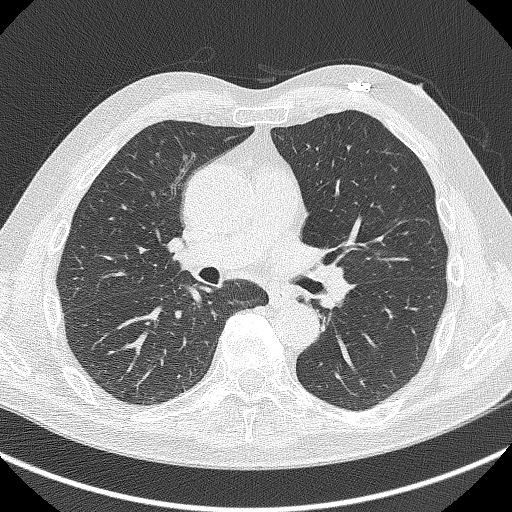
[im 200/323  lung]
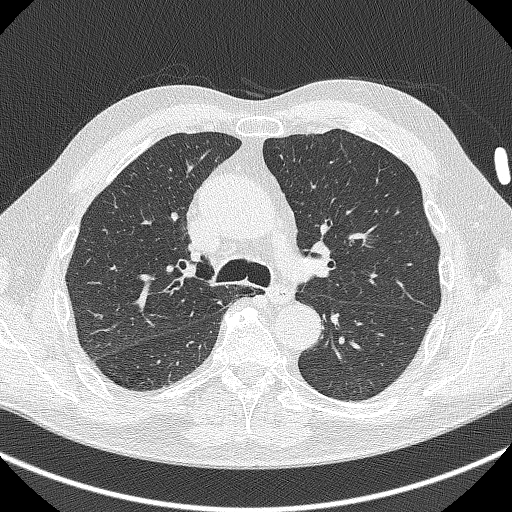
[im 231/323  mediastinal]
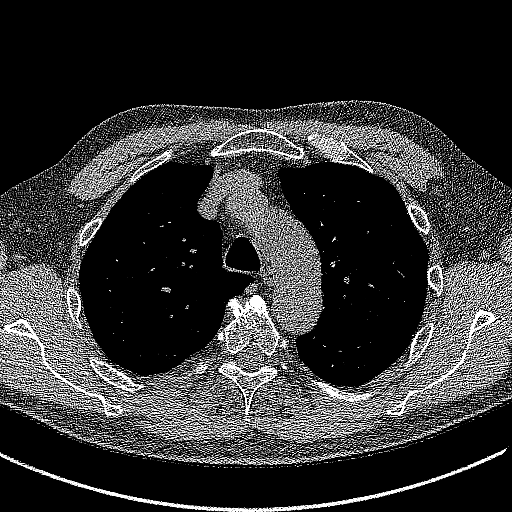
[im 231/323  lung]
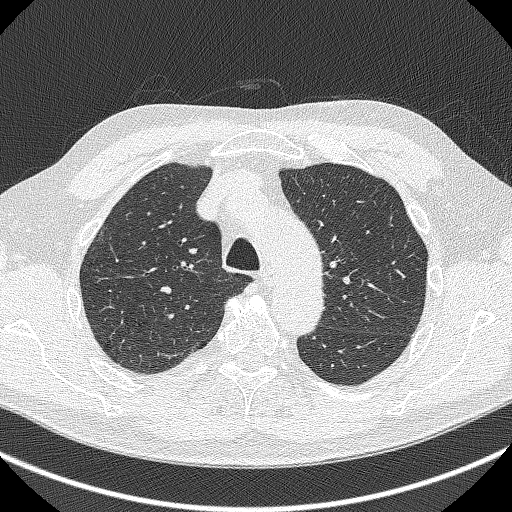
[im 246/323  lung]
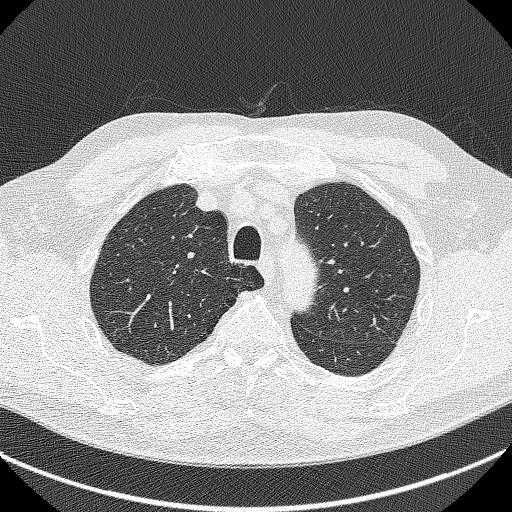
[im 277/323  lung]
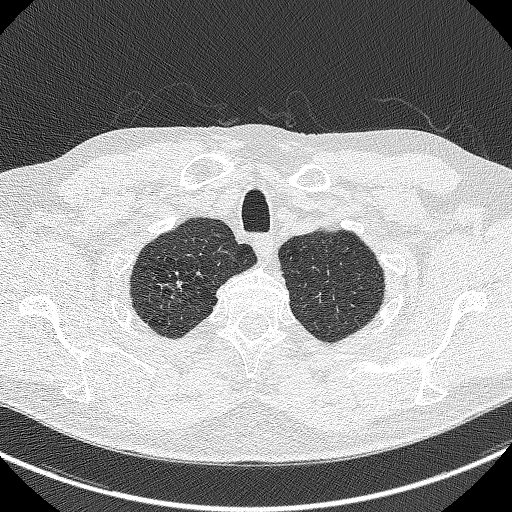
[im 307/323  lung]
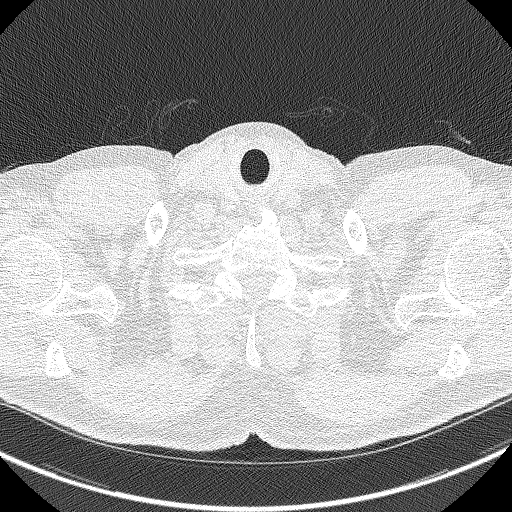

[Series 5: coronals lung 1.00 cor · coronal · 0.63mm/px · 3 of 270 slices shown]
[im 54/270  lung]
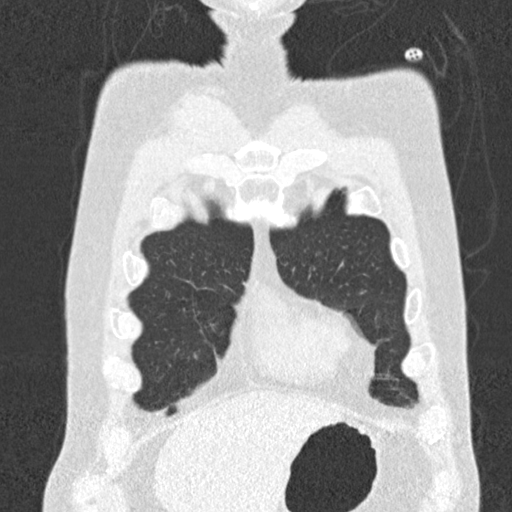
[im 108/270  lung]
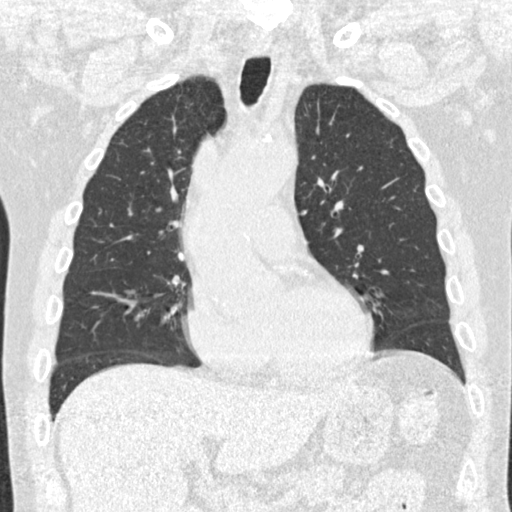
[im 162/270  lung]
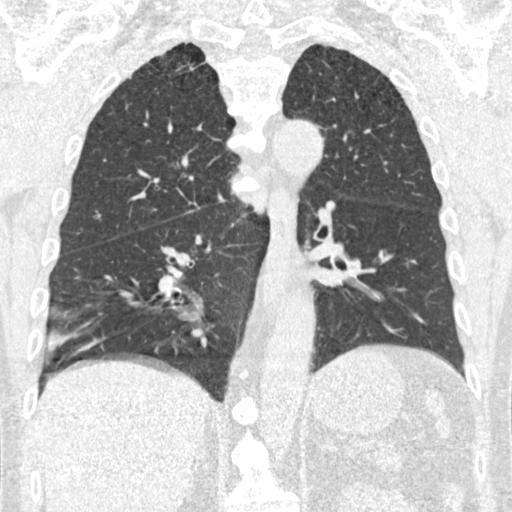

[15 of 40 positions shown; findings below may reference images not displayed]

FINDINGS: Cardiovascular: Borderline to mild ascending aortic dilatation,
cm on 34/2. Similar to on the prior exam (when remeasured). Tortuous
thoracic aorta. Mild cardiomegaly, without pericardial effusion.
Three vessel coronary artery calcification.

Mediastinum/Nodes: No mediastinal or definite hilar adenopathy,
given limitations of unenhanced CT.

Lungs/Pleura: No pleural fluid. Moderate centrilobular and
paraseptal emphysema. Mild motion degradation inferiorly.

Biapical pleuroparenchymal scarring. Pulmonary nodules of maximally
volume derived equivalent diameter 6.2 mm are not significantly
changed.

Upper Abdomen: Cholecystectomy. Normal imaged portions of the
spleen, liver, stomach, pancreas, adrenal glands, left kidney.

Musculoskeletal: Left chest loop recorder. Mild osteopenia. Moderate
thoracic spondylosis.
IMPRESSION: 1. Lung-RADS 2, benign appearance or behavior. Continue annual
screening with low-dose chest CT without contrast in 12 months.
2. Aortic Atherosclerosis (8GYMB-Z3L.L) and Emphysema (8GYMB-UR5.V).
Coronary artery atherosclerosis.
3. Similar borderline to mild ascending aortic dilatation. This can
be re-evaluated on follow-up lung cancer screening CT. This
recommendation follows 0898
ACCF/AHA/AATS/ACR/ASA/SCA/RAPOLTI/MORRIS/DON LOLITO/TIGER Guidelines for the
Diagnosis and Management of Patients with Thoracic Aortic Disease.
Circulation. 0898; 121: [id number]. Aortic aneurysm NOS (8GYMB-DMF.Y)

## 2021-07-14 ENCOUNTER — Ambulatory Visit (INDEPENDENT_AMBULATORY_CARE_PROVIDER_SITE_OTHER): Payer: Medicare Other

## 2021-07-14 ENCOUNTER — Other Ambulatory Visit: Payer: Self-pay

## 2021-07-14 VITALS — BP 131/80 | HR 60 | Temp 98.0°F | Resp 18 | Ht 68.0 in | Wt 184.0 lb

## 2021-07-14 DIAGNOSIS — J4550 Severe persistent asthma, uncomplicated: Secondary | ICD-10-CM

## 2021-07-14 MED ORDER — FAMOTIDINE IN NACL 20-0.9 MG/50ML-% IV SOLN: 20.0000 mg | Freq: Once | INTRAVENOUS | Status: DC | PRN

## 2021-07-14 MED ORDER — ALBUTEROL SULFATE HFA 108 (90 BASE) MCG/ACT IN AERS: 2.0000 | INHALATION_SPRAY | Freq: Once | RESPIRATORY_TRACT | Status: DC | PRN

## 2021-07-14 MED ORDER — METHYLPREDNISOLONE SODIUM SUCC 125 MG IJ SOLR: 125.0000 mg | Freq: Once | INTRAMUSCULAR | Status: DC | PRN

## 2021-07-14 MED ORDER — EPINEPHRINE 0.3 MG/0.3ML IJ SOAJ: 0.3000 mg | Freq: Once | INTRAMUSCULAR | Status: DC | PRN

## 2021-07-14 MED ORDER — SODIUM CHLORIDE 0.9 % IV SOLN: Freq: Once | INTRAVENOUS | Status: DC | PRN

## 2021-07-14 MED ORDER — MEPOLIZUMAB 100 MG ~~LOC~~ SOLR
100.0000 mg | Freq: Once | SUBCUTANEOUS | Status: AC
  Administered 2021-07-14: 100 mg via SUBCUTANEOUS
  Filled 2021-07-14: qty 1

## 2021-07-14 MED ORDER — DIPHENHYDRAMINE HCL 50 MG/ML IJ SOLN: 50.0000 mg | Freq: Once | INTRAMUSCULAR | Status: DC | PRN

## 2021-07-14 NOTE — Progress Notes (Signed)
Diagnosis: Asthma  Provider:  Marshell Garfinkel, MD  Procedure: Injection  Nucala (Mepolizumab), Dose: 100 mg, Site: subcutaneous, Number of injections: 1  Discharge: Condition: Good, Destination: Home . AVS provided to patient.   Performed by:  Monisha Siebel, Sherlon Handing, LPN

## 2021-07-16 ENCOUNTER — Ambulatory Visit (INDEPENDENT_AMBULATORY_CARE_PROVIDER_SITE_OTHER): Payer: Medicare Other

## 2021-07-16 DIAGNOSIS — J309 Allergic rhinitis, unspecified: Secondary | ICD-10-CM | POA: Diagnosis not present

## 2021-07-23 ENCOUNTER — Ambulatory Visit (INDEPENDENT_AMBULATORY_CARE_PROVIDER_SITE_OTHER): Payer: Medicare Other | Admitting: *Deleted

## 2021-07-23 DIAGNOSIS — J309 Allergic rhinitis, unspecified: Secondary | ICD-10-CM

## 2021-07-30 ENCOUNTER — Telehealth: Payer: Self-pay | Admitting: Pharmacy Technician

## 2021-07-30 NOTE — Telephone Encounter (Addendum)
Auth Submission: no auth needed Payer: medicare/emblem health Medication & CPT/J Code(s) submitted: Nucala (Mepolizumab) [id number] Route of submission (phone, fax, portal): phone Auth type: Buy/Bill Units/visits requested: 12 Reference number: [id number] [place]. Approval from: 07/30/21 to 07/30/22  Patient has active Medicare A/B and [place] = [phone] (which follow medicare quidelines) Medicare = 80% Emblem = 20%  08/14/22 - Medicare and supplement enrollment reviewed and approval extended to 08/10/23

## 2021-07-31 LAB — CUP PACEART REMOTE DEVICE CHECK
Date Time Interrogation Session: 20221217230712
Implantable Pulse Generator Implant Date: 20210107

## 2021-08-06 ENCOUNTER — Ambulatory Visit (INDEPENDENT_AMBULATORY_CARE_PROVIDER_SITE_OTHER): Payer: Medicare Other

## 2021-08-06 DIAGNOSIS — I639 Cerebral infarction, unspecified: Secondary | ICD-10-CM

## 2021-08-12 ENCOUNTER — Ambulatory Visit (INDEPENDENT_AMBULATORY_CARE_PROVIDER_SITE_OTHER): Payer: Medicare Other | Admitting: *Deleted

## 2021-08-12 ENCOUNTER — Other Ambulatory Visit: Payer: Self-pay

## 2021-08-12 VITALS — BP 157/91 | HR 82 | Temp 98.2°F | Resp 20 | Ht 68.0 in | Wt 184.8 lb

## 2021-08-12 DIAGNOSIS — J4550 Severe persistent asthma, uncomplicated: Secondary | ICD-10-CM

## 2021-08-12 MED ORDER — FAMOTIDINE IN NACL 20-0.9 MG/50ML-% IV SOLN: 20.0000 mg | Freq: Once | INTRAVENOUS | Status: DC | PRN

## 2021-08-12 MED ORDER — DIPHENHYDRAMINE HCL 50 MG/ML IJ SOLN: 50.0000 mg | Freq: Once | INTRAMUSCULAR | Status: DC | PRN

## 2021-08-12 MED ORDER — MEPOLIZUMAB 100 MG ~~LOC~~ SOLR
100.0000 mg | Freq: Once | SUBCUTANEOUS | Status: AC
  Administered 2021-08-12: 100 mg via SUBCUTANEOUS
  Filled 2021-08-12: qty 1

## 2021-08-12 MED ORDER — ALBUTEROL SULFATE HFA 108 (90 BASE) MCG/ACT IN AERS: 2.0000 | INHALATION_SPRAY | Freq: Once | RESPIRATORY_TRACT | Status: DC | PRN

## 2021-08-12 MED ORDER — EPINEPHRINE 0.3 MG/0.3ML IJ SOAJ: 0.3000 mg | Freq: Once | INTRAMUSCULAR | Status: DC | PRN

## 2021-08-12 MED ORDER — METHYLPREDNISOLONE SODIUM SUCC 125 MG IJ SOLR: 125.0000 mg | Freq: Once | INTRAMUSCULAR | Status: DC | PRN

## 2021-08-12 MED ORDER — SODIUM CHLORIDE 0.9 % IV SOLN: Freq: Once | INTRAVENOUS | Status: DC | PRN

## 2021-08-12 NOTE — Progress Notes (Signed)
Diagnosis: Asthma  Provider:  Marshell Garfinkel, MD  Procedure: Injection  Nucala (Mepolizumab), Dose: 100 mg, Site: subcutaneous, Number of injections: 1  Discharge: Condition: Good, Destination: Home . AVS provided to patient.   Performed by:  Oren Beckmann, RN

## 2021-08-13 ENCOUNTER — Ambulatory Visit (INDEPENDENT_AMBULATORY_CARE_PROVIDER_SITE_OTHER): Payer: Medicare Other

## 2021-08-13 DIAGNOSIS — J309 Allergic rhinitis, unspecified: Secondary | ICD-10-CM | POA: Diagnosis not present

## 2021-08-19 NOTE — Progress Notes (Signed)
Carelink Summary Report / Loop Recorder 

## 2021-09-08 ENCOUNTER — Ambulatory Visit (INDEPENDENT_AMBULATORY_CARE_PROVIDER_SITE_OTHER): Payer: Medicare Other

## 2021-09-08 ENCOUNTER — Other Ambulatory Visit: Payer: Self-pay | Admitting: Pharmacy Technician

## 2021-09-08 DIAGNOSIS — I639 Cerebral infarction, unspecified: Secondary | ICD-10-CM | POA: Diagnosis not present

## 2021-09-08 LAB — CUP PACEART REMOTE DEVICE CHECK
Date Time Interrogation Session: 20230129231117
Implantable Pulse Generator Implant Date: 20210107

## 2021-09-09 ENCOUNTER — Ambulatory Visit: Payer: Medicare Other

## 2021-09-11 ENCOUNTER — Other Ambulatory Visit: Payer: Self-pay

## 2021-09-11 ENCOUNTER — Ambulatory Visit (INDEPENDENT_AMBULATORY_CARE_PROVIDER_SITE_OTHER): Payer: Medicare Other

## 2021-09-11 VITALS — BP 149/81 | HR 76 | Temp 98.0°F | Resp 18 | Ht 67.0 in | Wt 187.0 lb

## 2021-09-11 DIAGNOSIS — J4550 Severe persistent asthma, uncomplicated: Secondary | ICD-10-CM

## 2021-09-11 MED ORDER — FAMOTIDINE IN NACL 20-0.9 MG/50ML-% IV SOLN: 20.0000 mg | Freq: Once | INTRAVENOUS | Status: DC | PRN

## 2021-09-11 MED ORDER — ALBUTEROL SULFATE HFA 108 (90 BASE) MCG/ACT IN AERS: 2.0000 | INHALATION_SPRAY | Freq: Once | RESPIRATORY_TRACT | Status: DC | PRN

## 2021-09-11 MED ORDER — EPINEPHRINE 0.3 MG/0.3ML IJ SOAJ: 0.3000 mg | Freq: Once | INTRAMUSCULAR | Status: DC | PRN

## 2021-09-11 MED ORDER — DIPHENHYDRAMINE HCL 50 MG/ML IJ SOLN: 50.0000 mg | Freq: Once | INTRAMUSCULAR | Status: DC | PRN

## 2021-09-11 MED ORDER — MEPOLIZUMAB 100 MG ~~LOC~~ SOLR
100.0000 mg | Freq: Once | SUBCUTANEOUS | Status: AC
  Administered 2021-09-11: 100 mg via SUBCUTANEOUS
  Filled 2021-09-11: qty 1

## 2021-09-11 MED ORDER — METHYLPREDNISOLONE SODIUM SUCC 125 MG IJ SOLR: 125.0000 mg | Freq: Once | INTRAMUSCULAR | Status: DC | PRN

## 2021-09-11 MED ORDER — SODIUM CHLORIDE 0.9 % IV SOLN: Freq: Once | INTRAVENOUS | Status: DC | PRN

## 2021-09-11 NOTE — Progress Notes (Signed)
Diagnosis: Asthma  Provider:  Marshell Garfinkel, MD  Procedure: Injection  Nucala (Mepolizumab), Dose: 100 mg, Site: subcutaneous, Number of injections: 1  Discharge: Condition: Good, Destination: Home . AVS provided to patient.   Performed by:  Arnoldo Morale, RN

## 2021-09-16 NOTE — Progress Notes (Signed)
Carelink Summary Report / Loop Recorder 

## 2021-09-17 ENCOUNTER — Other Ambulatory Visit: Payer: Self-pay

## 2021-09-17 ENCOUNTER — Encounter: Payer: Self-pay | Admitting: Allergy & Immunology

## 2021-09-17 ENCOUNTER — Ambulatory Visit (INDEPENDENT_AMBULATORY_CARE_PROVIDER_SITE_OTHER): Payer: Medicare Other | Admitting: Allergy & Immunology

## 2021-09-17 VITALS — BP 110/70 | HR 94 | Temp 97.3°F | Resp 16 | Ht 67.0 in | Wt 184.2 lb

## 2021-09-17 DIAGNOSIS — J4550 Severe persistent asthma, uncomplicated: Secondary | ICD-10-CM | POA: Diagnosis not present

## 2021-09-17 DIAGNOSIS — I639 Cerebral infarction, unspecified: Secondary | ICD-10-CM | POA: Diagnosis not present

## 2021-09-17 DIAGNOSIS — J302 Other seasonal allergic rhinitis: Secondary | ICD-10-CM

## 2021-09-17 DIAGNOSIS — J309 Allergic rhinitis, unspecified: Secondary | ICD-10-CM | POA: Diagnosis not present

## 2021-09-17 MED ORDER — NEBULIZER MASK ADULT MISC: 1.0000 | 0 refills | Status: AC | PRN

## 2021-09-17 NOTE — Patient Instructions (Addendum)
1. Severe persistent asthma, uncomplicated - Spirometry looks fairly stable.  - Start the prednisone dose pack provided today.  - Daily controller medication(s): Symbicort two puffs twice daily with spacer + Singulair (montelukast) 60m daily + Nucala 1076mmonthly - Rescue medications: albuterol 4 puffs every 4-6 hours as needed - Asthma control goals:  * Full participation in all desired activities (may need albuterol before activity) * Albuterol use two time or less a week on average (not counting use with activity) * Cough interfering with sleep two time or less a month * Oral steroids no more than once a year * No hospitalizations  2.Chronic nonseasonal allergic rhinitis - Continue with azelastine and fluticasone daily.  - Continue with allergy shots at the same schedule. - We will stop allergy shots since it is coming up on five years in May 2023.  - We will just use up your current vial.    3. Return in about 6 months (around 03/17/2022).    Please inform usKoreaf any Emergency Department visits, hospitalizations, or changes in symptoms. Call usKoreaefore going to the ED for breathing or allergy symptoms since we might be able to fit you in for a sick visit. Feel free to contact usKoreanytime with any questions, problems, or concerns.  It was a pleasure to see you again today!   Websites that have reliable patient information: 1. American Academy of Asthma, Allergy, and Immunology: www.aaaai.org 2. Food Allergy Research and Education (FARE): foodallergy.org 3. Mothers of Asthmatics: http://www.asthmacommunitynetwork.org 4. American College of Allergy, Asthma, and Immunology: www.acaai.org  Like usKorean FaNational Citynd Instagram for our latest updates!      Make sure you are registered to vote! If you have moved or changed any of your contact information, you will need to get this updated before voting!  In some cases, you MAY be able to register to vote online:  [email or web address]

## 2021-09-17 NOTE — Progress Notes (Signed)
FOLLOW UP  Date of Service/Encounter:  09/17/21   Assessment:   Severe persistent asthma, uncomplicated - doing well on Nucala monthly (administered by Pulmonology)   Chronic nonseasonal allergic rhinitis (trees, weeds, mold, dust mite) - on allergen immunotherapy with maintenance reached May 2018   Previous World Location manager   Recent stroke - with full recovery   2 years smoke free - although he admits that he cheats occasionally  Plan/Recommendations:   1. Severe persistent asthma, uncomplicated - Spirometry looks fairly stable.  - Start the prednisone dose pack provided today.  - Daily controller medication(s): Symbicort two puffs twice daily with spacer + Singulair (montelukast) 60m daily + Nucala 1033mmonthly - Rescue medications: albuterol 4 puffs every 4-6 hours as needed - Asthma control goals:  * Full participation in all desired activities (may need albuterol before activity) * Albuterol use two time or less a week on average (not counting use with activity) * Cough interfering with sleep two time or less a month * Oral steroids no more than once a year * No hospitalizations  2.Chronic nonseasonal allergic rhinitis - Continue with azelastine and fluticasone daily.  - Continue with allergy shots at the same schedule. - We will stop allergy shots since it is coming up on five years in May 2023.  - We will just use up your current vial.    3. Return in about 6 months (around 03/17/2022).   Subjective:   Caleb BAYONAs a [age].o. male presenting today for follow up of  Chief Complaint  Patient presents with   Asthma    5-6 mth f/u - Great!!   Seasonal and Perennial allergic rhinitis    5-6 mth f/u - Great until the last 6 dys - stuffy nose - little green mucous but mostly clear.     LaIshmael Evans a history of the following: Patient Active Problem List   Diagnosis Date Noted   Cryptogenic stroke ([facility]/02/2020    Medication management 06/13/2019   CVA (cerebral vascular accident) ([facility]/19/[place]   Former smoker 05/29/2019   Aneurysm of thoracic aorta 11/22/2017   Hyperlipidemia 11/22/2017   AAA (abdominal aortic aneurysm) without rupture 11/22/2017   Seasonal and perennial allergic rhinitis 04/19/2017   Severe persistent asthma, uncomplicated 0831/49/7026 GERD (gastroesophageal reflux disease) 04/08/2016   Chronic nonseasonal allergic rhinitis due to fungal spores 01/17/2014   Asthma-COPD overlap syndrome ([facility]06/05/2014   Mild chronic obstructive pulmonary disease ([facility]06/05/2014    History obtained from: chart review and patient.  Caleb Evans a [age].o. male presenting for a follow up visit.  He was last seen in September 2022.  At that time, his spirometry looked fairly stable.  We started Breztri 2 puffs twice daily and lieu of Symbicort and Spiriva.  We continue with Singulair 10 mg daily as well as his Nucala monthly.  For his rhinitis, we stopped Astelin to see if the intermittent use to be better for responsiveness.  We continue with Flonase 1 spray per nostril daily and allergy shots at the same schedule.  Since last visit, he has mostly done well. He was at the beach for two weeks. [place].  He apparently had some prednisone left over from some exacerbation early last year and he used that.  He has been doing 10 mg daily for 3 to 4 days, but has not quite  recovered completely.  Asthma/Respiratory Symptom History: He is on the Symbicort two puffs twice daily. He thinks that this was a coverage issue. It is easier to keep going with this.  He did have some prednisone that he took when he got back from the beach.  He has been using his nebulizer this past week, but otherwise has not touched it for 3 months.  He remains on the mepolizumab which he thinks is still helping him.  After getting the mepolizumab, he has 2-3 really good  weeks and then struggles for 1 to 2 weeks before the next 1 is due.  He is not interested in changing biologics.  He gets his mepolizumab at the pulmonary office in [place].  I have offered to give it at our clinic here in [place], but he said that his wife likes going to [place] to do shopping.   Allergic Rhinitis Symptom History: He remains on the azelastine and fluticasone.  He does 1 spray per nostril once daily.  He has not had any sinus infections.  Overall, symptoms are well controlled.  He is getting his allergy shots every month.  He will be at 5 years of maintenance in May 2023.  He has had quite a bit of dental issues.  Apparently the teeth on the top are all fake at this point.  He tells me that he spent too much time eating caramel when he was younger.  He would routinely go to the dentist and have 10-15 cavities each time.  He and his wife are enjoying their condominium that they purchased on [address].  They recently got back from 2 weeks there.  His motorcycle is still out of commission.  He spends several minutes telling me about some fancy electrical part that is not working.  I ask him if he will just be easier to buy a new motorcycle, but he says that is is now an antique and he wants to keep it running.  Otherwise, there have been no changes to his past medical history, surgical history, family history, or social history.    Review of Systems  Constitutional: Negative.  Negative for chills, fever, malaise/fatigue and weight loss.  HENT: Negative.  Negative for congestion, ear discharge and ear pain.   Eyes:  Negative for pain, discharge and redness.  Respiratory:  Negative for cough, sputum production, shortness of breath and wheezing.   Cardiovascular: Negative.  Negative for chest pain and palpitations.  Gastrointestinal:  Negative for abdominal pain, diarrhea, heartburn, nausea and vomiting.  Skin: Negative.  Negative for itching and rash.  Neurological:   Negative for dizziness and headaches.  Endo/Heme/Allergies:  Negative for environmental allergies. Does not bruise/bleed easily.      Objective:   Blood pressure 110/70, pulse 94, temperature (!) 97.3 F (36.3 C), resp. rate 16, height 5' 7"  (1.702 m), weight 184 lb 3.2 oz (83.6 kg), SpO2 98 %. Body mass index is 28.85 kg/m.   Physical Exam:  Physical Exam Vitals reviewed.  Constitutional:      Appearance: He is well-developed.     Comments: Very talkative.  HENT:     Head: Normocephalic and atraumatic.     Right Ear: Tympanic membrane, ear canal and external ear normal.     Left Ear: Tympanic membrane, ear canal and external ear normal.     Nose: No nasal deformity, septal deviation, mucosal edema or rhinorrhea.     Right Turbinates: Enlarged, swollen and pale.     Left  Turbinates: Enlarged, swollen and pale.     Right Sinus: No maxillary sinus tenderness or frontal sinus tenderness.     Left Sinus: No maxillary sinus tenderness or frontal sinus tenderness.     Mouth/Throat:     Mouth: Mucous membranes are not pale and not dry.     Pharynx: Uvula midline.  Eyes:     General: Lids are normal. No allergic shiner.       Right eye: No discharge.        Left eye: No discharge.     Conjunctiva/sclera: Conjunctivae normal.     Right eye: Right conjunctiva is not injected. No chemosis.    Left eye: Left conjunctiva is not injected. No chemosis.    Pupils: Pupils are equal, round, and reactive to light.  Cardiovascular:     Rate and Rhythm: Normal rate and regular rhythm.     Heart sounds: Normal heart sounds.  Pulmonary:     Effort: Pulmonary effort is normal. No tachypnea, accessory muscle usage or respiratory distress.     Breath sounds: Normal breath sounds. No wheezing, rhonchi or rales.     Comments: Moving air well in all lung fields.  No increased work of breathing. Chest:     Chest wall: No tenderness.  Lymphadenopathy:     Cervical: No cervical adenopathy.   Skin:    General: Skin is warm.     Capillary Refill: Capillary refill takes less than 2 seconds.     Coloration: Skin is not pale.     Findings: No abrasion, erythema, petechiae or rash. Rash is not papular, urticarial or vesicular.     Comments: No eczematous or urticarial lesions noted.  Neurological:     Mental Status: He is alert.  Psychiatric:        Behavior: Behavior is cooperative.     Diagnostic studies:    Spirometry: Vomiting is largely unchanged from the last visit.  He does have some mild restriction.          [name], MD  [facility]

## 2021-10-07 ENCOUNTER — Ambulatory Visit: Payer: Medicare Other

## 2021-10-09 ENCOUNTER — Ambulatory Visit: Payer: Medicare Other

## 2021-10-13 ENCOUNTER — Ambulatory Visit (INDEPENDENT_AMBULATORY_CARE_PROVIDER_SITE_OTHER): Payer: Medicare Other

## 2021-10-13 DIAGNOSIS — I639 Cerebral infarction, unspecified: Secondary | ICD-10-CM

## 2021-10-14 LAB — CUP PACEART REMOTE DEVICE CHECK
Date Time Interrogation Session: 20230303230353
Implantable Pulse Generator Implant Date: 20210107

## 2021-10-15 ENCOUNTER — Ambulatory Visit (INDEPENDENT_AMBULATORY_CARE_PROVIDER_SITE_OTHER): Payer: Medicare Other

## 2021-10-15 ENCOUNTER — Telehealth: Payer: Self-pay | Admitting: Pulmonary Disease

## 2021-10-15 ENCOUNTER — Other Ambulatory Visit: Payer: Self-pay

## 2021-10-15 VITALS — BP 168/96 | HR 63 | Temp 98.1°F | Ht 67.0 in | Wt 184.6 lb

## 2021-10-15 DIAGNOSIS — J4550 Severe persistent asthma, uncomplicated: Secondary | ICD-10-CM | POA: Diagnosis not present

## 2021-10-15 MED ORDER — FAMOTIDINE IN NACL 20-0.9 MG/50ML-% IV SOLN: 20.0000 mg | Freq: Once | INTRAVENOUS | Status: DC | PRN

## 2021-10-15 MED ORDER — SODIUM CHLORIDE 0.9 % IV SOLN: Freq: Once | INTRAVENOUS | Status: DC | PRN

## 2021-10-15 MED ORDER — MEPOLIZUMAB 100 MG ~~LOC~~ SOLR
100.0000 mg | Freq: Once | SUBCUTANEOUS | Status: AC
  Administered 2021-10-15: 100 mg via SUBCUTANEOUS
  Filled 2021-10-15: qty 1

## 2021-10-15 MED ORDER — DIPHENHYDRAMINE HCL 50 MG/ML IJ SOLN: 50.0000 mg | Freq: Once | INTRAMUSCULAR | Status: DC | PRN

## 2021-10-15 MED ORDER — METHYLPREDNISOLONE SODIUM SUCC 125 MG IJ SOLR: 125.0000 mg | Freq: Once | INTRAMUSCULAR | Status: DC | PRN

## 2021-10-15 MED ORDER — ALBUTEROL SULFATE HFA 108 (90 BASE) MCG/ACT IN AERS: 2.0000 | INHALATION_SPRAY | Freq: Once | RESPIRATORY_TRACT | Status: DC | PRN

## 2021-10-15 MED ORDER — EPINEPHRINE 0.3 MG/0.3ML IJ SOAJ: 0.3000 mg | Freq: Once | INTRAMUSCULAR | Status: DC | PRN

## 2021-10-15 NOTE — Progress Notes (Signed)
Diagnosis: Asthma ? ?Provider:  Marshell Garfinkel, MD ? ?Procedure: Injection ? ?Nucala (Mepolizumab), Dose: 100 mg, Site: subcutaneous, Number of injections: 1 ? ?Discharge: Condition: Good, Destination: Home . AVS provided to patient.  ? ?Performed by:  Cleophus Molt, RN  ? ? ? ?  ?

## 2021-10-16 NOTE — Telephone Encounter (Signed)
Patient is scheduled with Dr. Valeta Harms on 10/24/2021 at 4:30pm- nothing further needed. ?

## 2021-10-17 ENCOUNTER — Ambulatory Visit (INDEPENDENT_AMBULATORY_CARE_PROVIDER_SITE_OTHER): Payer: Medicare Other

## 2021-10-17 DIAGNOSIS — J309 Allergic rhinitis, unspecified: Secondary | ICD-10-CM | POA: Diagnosis not present

## 2021-10-24 ENCOUNTER — Encounter: Payer: Self-pay | Admitting: Pulmonary Disease

## 2021-10-24 ENCOUNTER — Ambulatory Visit (INDEPENDENT_AMBULATORY_CARE_PROVIDER_SITE_OTHER): Payer: Medicare Other | Admitting: Pulmonary Disease

## 2021-10-24 ENCOUNTER — Other Ambulatory Visit: Payer: Self-pay

## 2021-10-24 VITALS — BP 142/82 | HR 70 | Temp 97.8°F | Ht 67.0 in | Wt 186.0 lb

## 2021-10-24 DIAGNOSIS — J4550 Severe persistent asthma, uncomplicated: Secondary | ICD-10-CM | POA: Diagnosis not present

## 2021-10-24 DIAGNOSIS — J3089 Other allergic rhinitis: Secondary | ICD-10-CM

## 2021-10-24 DIAGNOSIS — J302 Other seasonal allergic rhinitis: Secondary | ICD-10-CM

## 2021-10-24 DIAGNOSIS — J449 Chronic obstructive pulmonary disease, unspecified: Secondary | ICD-10-CM

## 2021-10-24 DIAGNOSIS — Z87891 Personal history of nicotine dependence: Secondary | ICD-10-CM

## 2021-10-24 MED ORDER — BUDESONIDE-FORMOTEROL FUMARATE 160-4.5 MCG/ACT IN AERO: 2.0000 | INHALATION_SPRAY | Freq: Two times a day (BID) | RESPIRATORY_TRACT | 6 refills | Status: DC

## 2021-10-24 MED ORDER — AZELASTINE HCL 137 MCG/SPRAY NA SOLN
NASAL | 5 refills | Status: DC
Start: 2021-10-24 — End: 2022-12-17

## 2021-10-24 MED ORDER — FLUTICASONE PROPIONATE 50 MCG/ACT NA SUSP: 2.0000 | Freq: Every day | NASAL | 5 refills | Status: DC

## 2021-10-24 MED ORDER — BENZONATATE 100 MG PO CAPS: 200.0000 mg | ORAL_CAPSULE | Freq: Three times a day (TID) | ORAL | 1 refills | Status: DC | PRN

## 2021-10-24 NOTE — Progress Notes (Signed)
? ?Synopsis: Referred in Feb 2022 for severe asthma by Revelo, Elyse Jarvis* ? ?Subjective:  ? ?PATIENT ID: Caleb Evans GENDER: male DOB: May 24, 1951, MRN: [id number] ? ?Chief Complaint  ?Patient presents with  ? Follow-up  ?  Follow up. Patient says he had problems breathing and a lot of phlegm.   ? ? ?71 yo M, AAA, crohns, COPD, was at [employer], Retired [employer], retired early after 23 years. Mostly stable with his breathing now. He has been on Nucala injections for ~3 years. He does think it helps him. Currently using albuterol usually once per day. Rarely using nebulizer. Currently compliant with symbicort twice daily. Quit smoking 1 year ago. He still craves them on occasion. Recently on prednisone for an exacerbation while traveling in [place], [place] while on vacation.  At this point remained stable on his current regimen here today to establish care with new primary pulmonary provider. ? ?OV 10/24/2021: Here today for follow-up for severe asthma on biologic therapy.  Currently managed with Nucala.  Patient doing well with no significant complaints.  Currently using Symbicort, albuterol as needed.  Not had any exacerbations requiring steroids this past year.  Needs refills of medications. ? ? ? ? ?Past Medical History:  ?Diagnosis Date  ? AAA (abdominal aortic aneurysm)   ? Adrenal nodule ([facility])   ? Aneurysm of thoracic aorta 11/22/2017  ? Asthma   ? Asthma-COPD overlap syndrome ([facility]) 01/17/2014  ? Bladder mass   ? Bladder neoplasm   ? Cancer [facility])   ? bladder cancer  ? COPD (chronic obstructive pulmonary disease) ([facility])   ? Crohn's disease ([facility])   ? CVA (cerebral vascular accident) ([place]) 05/29/2019  ? Diverticulitis   ? Former smoker 05/29/2019  ? GERD (gastroesophageal reflux disease)   ? takes Nexium off and on  ? Hyperlipidemia 11/22/2017  ? Over weight   ? Seasonal and perennial allergic rhinitis 04/19/2017  ? Skin lesion   ? Thoracic aortic aneurysm   ? Weak urinary stream   ?   ? ?Family History  ?Adopted: Yes  ?Problem Relation Age of Onset  ? Healthy Son   ? Healthy Daughter   ? Bladder Cancer Neg Hx   ? Kidney cancer Neg Hx   ? Prostate cancer Neg Hx   ? Allergic rhinitis Neg Hx   ? Angioedema Neg Hx   ? Asthma Neg Hx   ? Eczema Neg Hx   ? Urticaria Neg Hx   ? Immunodeficiency Neg Hx   ? Atopy Neg Hx   ?  ? ?Past Surgical History:  ?Procedure Laterality Date  ? APPENDECTOMY    ? CHOLECYSTECTOMY    ? LAPAROSCOPIC PARTIAL COLECTOMY  01/18/2018  ? ERAS PATHWAY  ? LAPAROSCOPIC PARTIAL COLECTOMY N/A 01/18/2018  ? Procedure: LAPAROSCOPIC PARTIAL COLECTOMY ERAS PATHWAY;  Surgeon: Stark Klein, MD;  Location: [facility];  Service: General;  Laterality: N/A;  ? TONSILLECTOMY    ? ? ?Social History  ? ?Socioeconomic History  ? Marital status: Married  ?  Spouse name: Not on file  ? Number of children: 2  ? Years of education: Not on file  ? Highest education level: Associate degree: occupational, Hotel manager, or vocational program  ?Occupational History  ? Not on file  ?Tobacco Use  ? Smoking status: Former  ?  Packs/day: 3.00  ?  Years: 50.00  ?  Pack years: 150.00  ?  Types: Cigarettes  ?  Quit date: 04/10/2018  ?  Years  since quitting: 3.5  ? Smokeless tobacco: Never  ?Vaping Use  ? Vaping Use: Never used  ?Substance and Sexual Activity  ? Alcohol use: Yes  ?  Alcohol/week: 0.0 standard drinks  ?  Comment: occasional  ? Drug use: No  ? Sexual activity: Not on file  ?Other Topics Concern  ? Not on file  ?Social History Narrative  ? Patient is adopted  ? Married, lives with spouse  ? 2 children (boy and girl)  ? OCCUPATION: Dealer x26yr.  He was in [employer] [place] in [place] office and [place] ? Right handed  ? Pt drinks 2 cups of coffee a day, rarely drinks tea, he drinks soda 3-4 a day  ? ?Social Determinants of Health  ? ?Financial Resource Strain: Not on file  ?Food Insecurity: Not on file  ?Transportation Needs: Not on file  ?Physical Activity: Not on file  ?Stress: Not on file   ?Social Connections: Not on file  ?Intimate Partner Violence: Not on file  ?  ? ?Allergies  ?Allergen Reactions  ? Chlorpheniramine Other (See Comments)  ?  Sinus congestion  ? Amoxicillin-Pot Clavulanate Other (See Comments)  ?  UNSPECIFIED REACTION  ?Has patient had a PCN reaction causing immediate rash, facial/tongue/throat swelling, SOB or lightheadedness with hypotension: No ?Has patient had a PCN reaction causing severe rash involving mucus membranes or skin necrosis: No ?Has patient had a PCN reaction that required hospitalization: No ?Has patient had a PCN reaction occurring within the last 10 years: Yes ?If all of the above answers are "NO", then may proceed with Cephalosporin use. ? ?Caused liver failure   ? Lisinopril Cough  ?  ? ?Outpatient Medications Prior to Visit  ?Medication Sig Dispense Refill  ? albuterol (PROAIR HFA) 108 (90 Base) MCG/ACT inhaler INHALE 2 PUFFS EVERY 4 TO 6 HOURS AS NEEDED. 18 g 1  ? aspirin 81 MG chewable tablet Chew 1 tablet (81 mg total) by mouth daily.    ? atorvastatin (LIPITOR) 80 MG tablet Take 80 mg by mouth at bedtime.    ? Azelastine HCl 137 MCG/SPRAY SOLN PLACE 2 SPRAYS IN BOTH NOSTRILS 2 TIMES DAILY. 30 mL 5  ? benzonatate (TESSALON) 100 MG capsule TAKE 1 CAPSULE THREE TIMES A DAY AS NEEDED FOR COUGH. 20 capsule 0  ? budesonide-formoterol (SYMBICORT) 160-4.5 MCG/ACT inhaler Inhale 2 puffs into the lungs 2 (two) times daily. Uses as needed.    ? Calcium Carb-Cholecalciferol (CALCIUM 600+D3 PO) Take 1 tablet by mouth daily.    ? fluticasone (FLONASE) 50 MCG/ACT nasal spray Place 2 sprays into both nostrils daily. 16 g 5  ? ipratropium-albuterol (DUONEB) 0.5-2.5 (3) MG/3ML SOLN Take 3 mLs by nebulization every 4 (four) hours as needed. 360 mL 2  ? losartan (COZAAR) 25 MG tablet Take 25 mg by mouth daily.    ? Mepolizumab (NUCALA) 100 MG/ML SOAJ Inject into the skin.    ? Multiple Vitamin (MULTIVITAMIN WITH MINERALS) TABS tablet Take 1 tablet by mouth daily.    ?  olopatadine (PATANOL) 0.1 % ophthalmic solution Place 1 drop into both eyes 2 (two) times daily. 5 mL 5  ? Respiratory Therapy Supplies (NEBULIZER MASK ADULT) MISC 1 each by Does not apply route as needed. 1 each 0  ? ?Facility-Administered Medications Prior to Visit  ?Medication Dose Route Frequency Provider Last Rate Last Admin  ? clindamycin (CLEOCIN) 900 mg in dextrose 5 % 50 mL IVPB  900 mg Intravenous  60 min Pre-Op Stark Klein, MD      ? And  ? gentamicin (GARAMYCIN) 390 mg in dextrose 5 % 50 mL IVPB  5 mg/kg Intravenous 60 min Pre-Op Stark Klein, MD      ? ? ?Review of Systems  ?Constitutional:  Negative for chills, fever, malaise/fatigue and weight loss.  ?HENT:  Negative for hearing loss, sore throat and tinnitus.   ?Eyes:  Negative for blurred vision and double vision.  ?Respiratory:  Positive for cough, sputum production and shortness of breath. Negative for hemoptysis, wheezing and stridor.   ?Cardiovascular:  Negative for chest pain, palpitations, orthopnea, leg swelling and PND.  ?Gastrointestinal:  Negative for abdominal pain, constipation, diarrhea, heartburn, nausea and vomiting.  ?Genitourinary:  Negative for dysuria, hematuria and urgency.  ?Musculoskeletal:  Negative for joint pain and myalgias.  ?Skin:  Negative for itching and rash.  ?Neurological:  Negative for dizziness, tingling, weakness and headaches.  ?Endo/Heme/Allergies:  Negative for environmental allergies. Does not bruise/bleed easily.  ?Psychiatric/Behavioral:  Negative for depression. The patient is not nervous/anxious and does not have insomnia.   ?All other systems reviewed and are negative. ? ? ?Objective:  ?Physical Exam ?Vitals reviewed.  ?Constitutional:   ?   General: He is not in acute distress. ?   Appearance: He is well-developed.  ?HENT:  ?   Head: Normocephalic and atraumatic.  ?Eyes:  ?   General: No scleral icterus. ?   Conjunctiva/sclera: Conjunctivae normal.  ?   Pupils: Pupils are equal, round, and reactive to  light.  ?Neck:  ?   Vascular: No JVD.  ?   Trachea: No tracheal deviation.  ?Cardiovascular:  ?   Rate and Rhythm: Normal rate and regular rhythm.  ?   Heart sounds: Normal heart sounds. No murmur heard. ?Pulmo

## 2021-10-24 NOTE — Patient Instructions (Signed)
Thank you for visiting Dr. Valeta Harms at [facility]. ?Today we recommend the following: ? ?Stay on Nucala injections  ? ?Meds ordered this encounter  ?Medications  ? benzonatate (TESSALON) 100 MG capsule  ?  Sig: Take 2 capsules (200 mg total) by mouth 3 (three) times daily as needed for cough.  ?  Dispense:  90 capsule  ?  Refill:  1  ? Azelastine HCl 137 MCG/SPRAY SOLN  ?  Sig: PLACE 2 SPRAYS IN BOTH NOSTRILS 2 TIMES DAILY.  ?  Dispense:  30 mL  ?  Refill:  5  ? fluticasone (FLONASE) 50 MCG/ACT nasal spray  ?  Sig: Place 2 sprays into both nostrils daily.  ?  Dispense:  16 g  ?  Refill:  5  ? budesonide-formoterol (SYMBICORT) 160-4.5 MCG/ACT inhaler  ?  Sig: Inhale 2 puffs into the lungs 2 (two) times daily. Uses as needed.  ?  Dispense:  10.2 g  ?  Refill:  6  ? ?Return in about 1 year (around 10/25/2022). ? ? ? ?Please do your part to reduce the spread of COVID-19.  ?

## 2021-10-24 NOTE — Progress Notes (Signed)
Carelink Summary Report / Loop Recorder 

## 2021-11-12 ENCOUNTER — Ambulatory Visit: Payer: Medicare Other

## 2021-11-17 ENCOUNTER — Ambulatory Visit (INDEPENDENT_AMBULATORY_CARE_PROVIDER_SITE_OTHER): Payer: Medicare Other

## 2021-11-17 DIAGNOSIS — I639 Cerebral infarction, unspecified: Secondary | ICD-10-CM | POA: Diagnosis not present

## 2021-11-19 LAB — CUP PACEART REMOTE DEVICE CHECK
Date Time Interrogation Session: 20230405230557
Implantable Pulse Generator Implant Date: 20210107

## 2021-11-21 ENCOUNTER — Ambulatory Visit (INDEPENDENT_AMBULATORY_CARE_PROVIDER_SITE_OTHER): Payer: Medicare Other

## 2021-11-21 DIAGNOSIS — J309 Allergic rhinitis, unspecified: Secondary | ICD-10-CM

## 2021-11-26 ENCOUNTER — Ambulatory Visit (INDEPENDENT_AMBULATORY_CARE_PROVIDER_SITE_OTHER): Payer: Medicare Other | Admitting: *Deleted

## 2021-11-26 VITALS — BP 135/80 | HR 67 | Temp 98.7°F | Resp 18 | Ht 67.0 in | Wt 185.0 lb

## 2021-11-26 DIAGNOSIS — J4550 Severe persistent asthma, uncomplicated: Secondary | ICD-10-CM | POA: Diagnosis not present

## 2021-11-26 MED ORDER — MEPOLIZUMAB 100 MG ~~LOC~~ SOLR
100.0000 mg | Freq: Once | SUBCUTANEOUS | Status: AC
  Administered 2021-11-26: 100 mg via SUBCUTANEOUS
  Filled 2021-11-26: qty 1

## 2021-11-26 NOTE — Progress Notes (Signed)
Diagnosis: Asthma ? ?Provider:  Marshell Garfinkel, MD ? ?Procedure: Injection ? ?Nucala (Mepolizumab), Dose: 100 mg, Site: subcutaneous, Number of injections: 1 ? ?Discharge: Condition: Good, Destination: Home . AVS provided to patient.  ? ?Performed by:  Oren Beckmann, RN  ? ? ? ?  ?

## 2021-12-04 NOTE — Progress Notes (Signed)
Carelink Summary Report / Loop Recorder 

## 2021-12-22 ENCOUNTER — Ambulatory Visit (INDEPENDENT_AMBULATORY_CARE_PROVIDER_SITE_OTHER): Payer: Medicare Other

## 2021-12-22 DIAGNOSIS — I639 Cerebral infarction, unspecified: Secondary | ICD-10-CM | POA: Diagnosis not present

## 2021-12-24 ENCOUNTER — Ambulatory Visit: Payer: Medicare Other

## 2021-12-24 LAB — CUP PACEART REMOTE DEVICE CHECK
Date Time Interrogation Session: 20230510230326
Implantable Pulse Generator Implant Date: 20210107

## 2021-12-26 ENCOUNTER — Ambulatory Visit (INDEPENDENT_AMBULATORY_CARE_PROVIDER_SITE_OTHER): Payer: Medicare Other

## 2021-12-26 VITALS — BP 161/92 | HR 72 | Temp 98.0°F | Resp 18 | Ht 67.0 in | Wt 188.4 lb

## 2021-12-26 DIAGNOSIS — J4550 Severe persistent asthma, uncomplicated: Secondary | ICD-10-CM | POA: Diagnosis not present

## 2021-12-26 MED ORDER — MEPOLIZUMAB 100 MG ~~LOC~~ SOLR
100.0000 mg | Freq: Once | SUBCUTANEOUS | Status: AC
  Administered 2021-12-26: 100 mg via SUBCUTANEOUS
  Filled 2021-12-26: qty 1

## 2021-12-26 NOTE — Progress Notes (Signed)
Diagnosis: Asthma  Provider:  Marshell Garfinkel, MD  Procedure: Injection  Nucala (Mepolizumab), Dose: 100 mg, Site: subcutaneous, Number of injections: 1  Discharge: Condition: Good, Destination: Home . AVS provided to patient.   Performed by:  Cleophus Molt, RN

## 2021-12-29 ENCOUNTER — Ambulatory Visit (INDEPENDENT_AMBULATORY_CARE_PROVIDER_SITE_OTHER): Payer: Medicare Other | Admitting: Internal Medicine

## 2021-12-29 ENCOUNTER — Encounter: Payer: Self-pay | Admitting: Internal Medicine

## 2021-12-29 VITALS — BP 124/68 | HR 60 | Ht 67.0 in | Wt 185.4 lb

## 2021-12-29 DIAGNOSIS — I639 Cerebral infarction, unspecified: Secondary | ICD-10-CM

## 2021-12-29 NOTE — Progress Notes (Signed)
HPI [name] returns today for followup and to undergo ILR insertion. Caleb Evans is a pleasant 71 yo man with a cryptogenic stroke, who present to undergo ILR insertion. Caleb Evans has done well in Caleb interim. Caleb Evans notes a [place]. No neuro changes. No palpitations.  Allergies  Allergen Reactions   Chlorpheniramine Other (See Comments)    Sinus congestion   Amoxicillin-Pot Clavulanate Other (See Comments)    UNSPECIFIED REACTION  Has Caleb Evans had a PCN reaction causing immediate rash, facial/tongue/throat swelling, SOB or lightheadedness with hypotension: No Has Caleb Evans had a PCN reaction causing severe rash involving mucus membranes or skin necrosis: No Has Caleb Evans had a PCN reaction that required hospitalization: No Has Caleb Evans had a PCN reaction occurring within Caleb last 10 years: Yes If all of Caleb above answers are "NO", then may proceed with Cephalosporin use.  Caused liver failure    Lisinopril Cough     Current Outpatient Medications  Medication Sig Dispense Refill   albuterol (PROAIR HFA) 108 (90 Base) MCG/ACT inhaler INHALE 2 PUFFS EVERY 4 TO 6 HOURS AS NEEDED. 18 g 1   aspirin 81 MG chewable tablet Chew 1 tablet (81 mg total) by mouth daily.     atorvastatin (LIPITOR) 80 MG tablet Take 80 mg by mouth at bedtime.     Azelastine HCl 137 MCG/SPRAY SOLN PLACE 2 SPRAYS IN BOTH NOSTRILS 2 TIMES DAILY. 30 mL 5   benzonatate (TESSALON) 100 MG capsule Take 2 capsules (200 mg total) by mouth 3 (three) times daily as needed for cough. 90 capsule 1   budesonide-formoterol (SYMBICORT) 160-4.5 MCG/ACT inhaler Inhale 2 puffs into Caleb lungs 2 (two) times daily. Uses as needed. 10.2 g 6   Calcium Carb-Cholecalciferol (CALCIUM 600+D3 PO) Take 1 tablet by mouth daily.     fluticasone (FLONASE) 50 MCG/ACT nasal spray Place 2 sprays into both nostrils daily. 16 g 5   ipratropium-albuterol (DUONEB) 0.5-2.5 (3) MG/3ML SOLN Take 3 mLs by nebulization every 4 (four)  hours as needed. 360 mL 2   losartan (COZAAR) 25 MG tablet Take 25 mg by mouth daily.     Mepolizumab (NUCALA) 100 MG/ML SOAJ Inject into Caleb skin.     Multiple Vitamin (MULTIVITAMIN WITH MINERALS) TABS tablet Take 1 tablet by mouth daily.     olopatadine (PATANOL) 0.1 % ophthalmic solution Place 1 drop into both eyes 2 (two) times daily. 5 mL 5   Respiratory Therapy Supplies (NEBULIZER MASK ADULT) MISC 1 each by Does not apply route as needed. 1 each 0   No current facility-administered medications for this visit.   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   Past Surgical History:  Procedure Laterality Date   APPENDECTOMY     CHOLECYSTECTOMY     LAPAROSCOPIC PARTIAL COLECTOMY  01/18/2018   ERAS PATHWAY   LAPAROSCOPIC PARTIAL COLECTOMY N/A 01/18/2018   Procedure: LAPAROSCOPIC PARTIAL COLECTOMY ERAS PATHWAY;  Surgeon: Stark Klein, MD;  Location: [facility];  Service: General;  Laterality: N/A;   TONSILLECTOMY       Family History  Adopted: Yes  Problem Relation Age of Onset   Healthy Son    Healthy Daughter    Bladder Cancer Neg Hx    Kidney cancer Neg Hx    Prostate cancer Neg Hx    Allergic rhinitis Neg Hx    Angioedema Neg Hx    Asthma Neg Hx    Eczema Neg Hx    Urticaria Neg Hx    Immunodeficiency Neg Hx    Atopy Neg Hx      Social History   Socioeconomic History   Marital status: Married    Spouse name: Not on file   Number of children: 2   Years of education: Not on file   Highest education level: Associate degree: occupational, Hotel manager, or vocational program  Occupational History   Not on file  Tobacco Use   Smoking status: Former    Packs/day: 3.00    Years: 50.00    Pack years: 150.00    Types: Cigarettes    Quit date: 04/10/2018    Years since quitting: 3.7   Smokeless tobacco: Never  Vaping Use   Vaping Use: Never used  Substance and Sexual Activity   Alcohol use: Yes    Alcohol/week: 0.0 standard drinks    Comment: occasional   Drug use: No   Sexual activity: Not on file  Other Topics Concern   Not on file  Social History Narrative   Caleb Evans is adopted   Married, lives with spouse   2 children (boy and girl)   OCCUPATION: Dealer x55yr.  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  Physical Exam:  Well appearing NAD HEENT: Unremarkable Neck:  No JVD, no thyromegally Lymphatics:  No adenopathy Back:  No CVA  tenderness Lungs:  Clear with no wheezes HEART:  Regular rate rhythm, no murmurs, no rubs, no clicks Abd:  soft, positive bowel sounds, no organomegally, no rebound, no guarding Ext:  2 plus pulses, no edema, no cyanosis, no clubbing Skin:  No rashes no nodules Neuro:  CN II through XII intact, motor grossly intact  EKG - NSR  DEVICE  Normal device function.  See PaceArt for details. No arrhythmias on ILR  Assess/Plan:  1. Cryptogenic stroke - Caleb Evans has undergone ILR insertion 2 years ago. Caleb Evans will undergo watchful waiting. Hopefully we will be able to determine Caleb etiology of his stroke with his previously placed ILR. So far Caleb Evans has not had atrial fib.   2. HTN - his bp is well controlled. Caleb Evans will continue his current meds.    Carleene Overlie Stephania Macfarlane,MD

## 2021-12-29 NOTE — Patient Instructions (Signed)
Medication Instructions:  Your physician recommends that you continue on your current medications as directed. Please refer to the Current Medication list given to you today. *If you need a refill on your cardiac medications before your next appointment, please call your pharmacy*  Lab Work: None. If you have labs (blood work) drawn today and your tests are completely normal, you will receive your results only by: MyChart Message (if you have MyChart) OR A paper copy in the mail If you have any lab test that is abnormal or we need to change your treatment, we will call you to review the results.  Testing/Procedures: None.  Follow-Up: At [facility], you and your health needs are our priority.  As part of our continuing mission to provide you with exceptional heart care, we have created designated Provider Care Teams.  These Care Teams include your primary Cardiologist (physician) and Advanced Practice Providers (APPs -  Physician Assistants and Nurse Practitioners) who all work together to provide you with the care you need, when you need it.  Your physician wants you to follow-up in: 12 months with Gregg Taylor, MD or one of the following Advanced Practice Providers on your designated Care Team:    Renee Ursuy, PA-C Michael "Andy" Tillery, PA-C   You will receive a reminder letter in the mail two months in advance. If you don't receive a letter, please call our office to schedule the follow-up appointment.  We recommend signing up for the patient portal called "MyChart".  Sign up information is provided on this After Visit Summary.  MyChart is used to connect with patients for Virtual Visits (Telemedicine).  Patients are able to view lab/test results, encounter notes, upcoming appointments, etc.  Non-urgent messages can be sent to your provider as well.   To learn more about what you can do with MyChart, go to [email or web address].    Any Other Special Instructions Will Be Listed  Below (If Applicable).         

## 2022-01-02 ENCOUNTER — Ambulatory Visit (INDEPENDENT_AMBULATORY_CARE_PROVIDER_SITE_OTHER): Payer: Medicare Other

## 2022-01-02 DIAGNOSIS — J309 Allergic rhinitis, unspecified: Secondary | ICD-10-CM | POA: Diagnosis not present

## 2022-01-12 NOTE — Progress Notes (Signed)
Carelink Summary Report / Loop Recorder 

## 2022-01-21 ENCOUNTER — Ambulatory Visit (INDEPENDENT_AMBULATORY_CARE_PROVIDER_SITE_OTHER): Payer: Medicare Other | Admitting: *Deleted

## 2022-01-21 VITALS — BP 147/94 | HR 76 | Temp 97.4°F | Resp 16 | Ht 67.0 in | Wt 185.4 lb

## 2022-01-21 DIAGNOSIS — J4550 Severe persistent asthma, uncomplicated: Secondary | ICD-10-CM

## 2022-01-21 MED ORDER — MEPOLIZUMAB 100 MG ~~LOC~~ SOLR
100.0000 mg | Freq: Once | SUBCUTANEOUS | Status: AC
  Administered 2022-01-21: 100 mg via SUBCUTANEOUS
  Filled 2022-01-21: qty 1

## 2022-01-21 NOTE — Progress Notes (Signed)
Diagnosis: Asthma  Provider:  Marshell Garfinkel, MD  Procedure: Injection  Nucala (Mepolizumab), Dose: 100 mg, Site: subcutaneous, Number of injections: 1  Discharge: Condition: Good, Destination: Home . AVS provided to patient.   Performed by:  Oren Beckmann, RN

## 2022-01-26 ENCOUNTER — Ambulatory Visit (INDEPENDENT_AMBULATORY_CARE_PROVIDER_SITE_OTHER): Payer: Medicare Other

## 2022-01-26 DIAGNOSIS — I639 Cerebral infarction, unspecified: Secondary | ICD-10-CM | POA: Diagnosis not present

## 2022-01-26 DIAGNOSIS — J3089 Other allergic rhinitis: Secondary | ICD-10-CM

## 2022-01-26 NOTE — Progress Notes (Signed)
VIAL EXP 01-27-23

## 2022-01-28 ENCOUNTER — Ambulatory Visit (INDEPENDENT_AMBULATORY_CARE_PROVIDER_SITE_OTHER): Payer: Medicare Other

## 2022-01-28 DIAGNOSIS — J309 Allergic rhinitis, unspecified: Secondary | ICD-10-CM | POA: Diagnosis not present

## 2022-01-28 LAB — CUP PACEART REMOTE DEVICE CHECK
Date Time Interrogation Session: 20230612230132
Implantable Pulse Generator Implant Date: 20210107

## 2022-02-18 ENCOUNTER — Ambulatory Visit: Payer: Medicare Other

## 2022-02-18 NOTE — Progress Notes (Signed)
Carelink Summary Report / Loop Recorder 

## 2022-02-24 ENCOUNTER — Ambulatory Visit (INDEPENDENT_AMBULATORY_CARE_PROVIDER_SITE_OTHER): Payer: Medicare Other

## 2022-02-24 VITALS — BP 130/87 | HR 85 | Temp 97.9°F | Resp 18 | Ht 67.0 in | Wt 181.6 lb

## 2022-02-24 DIAGNOSIS — J4550 Severe persistent asthma, uncomplicated: Secondary | ICD-10-CM | POA: Diagnosis not present

## 2022-02-24 MED ORDER — MEPOLIZUMAB 100 MG ~~LOC~~ SOLR
100.0000 mg | Freq: Once | SUBCUTANEOUS | Status: AC
  Administered 2022-02-24: 100 mg via SUBCUTANEOUS
  Filled 2022-02-24: qty 1

## 2022-02-24 NOTE — Progress Notes (Signed)
Diagnosis: Asthma  Provider:  Marshell Garfinkel, MD  Procedure: Injection  Nucala (Mepolizumab), Dose: 100 mg, Site: subcutaneous, Number of injections: 1  Discharge: Condition: Good, Destination: Home . AVS provided to patient.   Performed by:  Koren Shiver, RN

## 2022-02-27 ENCOUNTER — Ambulatory Visit (INDEPENDENT_AMBULATORY_CARE_PROVIDER_SITE_OTHER): Payer: Medicare Other

## 2022-02-27 DIAGNOSIS — J309 Allergic rhinitis, unspecified: Secondary | ICD-10-CM | POA: Diagnosis not present

## 2022-03-02 ENCOUNTER — Ambulatory Visit (INDEPENDENT_AMBULATORY_CARE_PROVIDER_SITE_OTHER): Payer: Medicare Other

## 2022-03-02 DIAGNOSIS — I639 Cerebral infarction, unspecified: Secondary | ICD-10-CM

## 2022-03-03 LAB — CUP PACEART REMOTE DEVICE CHECK
Date Time Interrogation Session: 20230715230110
Implantable Pulse Generator Implant Date: 20210107

## 2022-03-10 NOTE — Patient Instructions (Incomplete)
1. Severe persistent asthma, uncomplicated.  - Daily controller medication(s): Symbicort two puffs twice daily with spacer + Singulair (montelukast) 64m daily + Nucala 1070mmonthly - Rescue medications: albuterol 4 puffs every 4-6 hours as needed - Asthma control goals:  * Full participation in all desired activities (may need albuterol before activity) * Albuterol use two time or less a week on average (not counting use with activity) * Cough interfering with sleep two time or less a month * Oral steroids no more than once a year * No hospitalizations  2.Chronic nonseasonal allergic rhinitis - Continue with azelastine and fluticasone daily.  - Continue with allergy shots at the same schedule. - We will stop allergy shots since it is coming up on five years in May 2023.  - We will just use up your current vial.    3. Schedule a follow up appointment in months

## 2022-03-11 ENCOUNTER — Ambulatory Visit (INDEPENDENT_AMBULATORY_CARE_PROVIDER_SITE_OTHER): Payer: Medicare Other | Admitting: Family

## 2022-03-11 ENCOUNTER — Encounter: Payer: Self-pay | Admitting: Family

## 2022-03-11 VITALS — BP 120/78 | HR 62 | Resp 18

## 2022-03-11 DIAGNOSIS — J3089 Other allergic rhinitis: Secondary | ICD-10-CM

## 2022-03-11 DIAGNOSIS — J4550 Severe persistent asthma, uncomplicated: Secondary | ICD-10-CM

## 2022-03-11 MED ORDER — CETIRIZINE HCL 10 MG PO TABS: 10.0000 mg | ORAL_TABLET | Freq: Every day | ORAL | 5 refills | Status: DC

## 2022-03-11 NOTE — Progress Notes (Signed)
[address], [address] [place] [address] Dept: [phone]  FOLLOW UP NOTE  Patient ID: Caleb Evans, male    DOB: 1951-08-06  Age: 71 y.o. MRN: [id number] Date of Office Visit: 03/11/2022  Assessment  Chief Complaint: Allergy Testing  HPI Caleb Evans is a 71 year old male who presents today for skin testing to environmental allergens.  He was last seen on September 17, 2021 by Dr. Ernst Bowler for severe persistent asthma-doing well on Nucala monthly (administered by pulmonology), chronic nonseasonal allergic rhinitis-on allergen immunotherapy with maintenance reached May 2018, previous World Location manager, recent stroke with full recovery, 2 years smoke-free-although he admits that he cheats occasionally.  He reports no new diagnosis or surgery since his last office visit.  Severe persistent asthma: He uses Symbicort 2 puffs once a day on most days. Sometimes he will take the evening dose of Symbicort.  He has not been taking Singulair 10 mg once a day.  He does receive Nucala injections.  He denies any problems or reactions with his Nucala injections.  He mentions that he has been holding off on using his medications as prescribed. If he can do without it he would like to.  He also mentions that he will hold off until his symptoms get bad.  He reports dry cough, wheezing, tightness in chest, shortness of breath, and nocturnal awakenings due to breathing problems.  Since his last office visit he has not made any trips to the emergency room or urgent care due to breathing problems.  He did take 1 leftover prednisone pill during the poor air quality.  Other than that he has not been on any other steroids.  He uses his albuterol approximately 3 times a week.  He does smoke  a cigarette every now and then.  Chronic nonseasonal allergic rhinitis: He is currently taking azelastine and fluticasone nasal spray daily.  He continues to receive allergy injections every 4  weeks.  He denies any problems or reactions with his allergy injections.  He still feels clogged up, has clear rhinorrhea, postnasal drip, and sneezing.  He has not had any sinus infections since we last saw him.  He has not ever had sinus surgery and has not seen ear nose and throat in a couple of years.  He reports that the ENT wanted to do surgery and he is not interested.  [place] allergy [place] beach[place]dep[place] on the direction of the wind.  He has tried [place] in the past and could not tell a difference.  He thought that budesonide with saline rinses "weird" and he does not want to do saline rinse again.   Drug Allergies:  Allergies  Allergen Reactions   Chlorpheniramine Other (See Comments)    Sinus congestion   Amoxicillin-Pot Clavulanate Other (See Comments)    UNSPECIFIED REACTION  Has patient had a PCN reaction causing immediate rash, facial/tongue/throat swelling, SOB or lightheadedness with hypotension: No Has patient had a PCN reaction causing severe rash involving mucus membranes or skin necrosis: No Has patient had a PCN reaction that required hospitalization: No Has patient had a PCN reaction occurring within the last 10 years: Yes If all of the above answers are "NO", then may proceed with Cephalosporin use.  Caused liver failure    Lisinopril Cough    Review of Systems: Review of Systems  Constitutional:  Negative for chills and fever.  HENT:         Reports nasal congestion, clear  rhinorrhea, postnasal drip, sneezing-but not as much  Eyes:        Reports itchy watery eyes for which he has eyedrops  Respiratory:  Positive for cough, shortness of breath and wheezing.        Reports dry cough, wheezing, tightness in his chest, shortness of breath, and nocturnal awakenings due to breathing problems.  Cardiovascular:  Negative for chest pain and palpitations.  Gastrointestinal:        Reports heartburn/reflux symptoms sometimes for which he will take Tums   Genitourinary:  Negative for frequency.  Skin:  Negative for itching and rash.  Neurological:  Negative for headaches.  Endo/Heme/Allergies:  Positive for environmental allergies.     Physical Exam: BP 120/78   Pulse 62   Resp 18   SpO2 98%    Physical Exam Constitutional:      Appearance: Normal appearance.  HENT:     Head: Normocephalic and atraumatic.     Comments: Pharynx normal, eyes normal, ears normal, nose: Bilateral lower turbinates mildly edematous and slightly erythematous with clear drainage noted    Right Ear: Tympanic membrane, ear canal and external ear normal.     Left Ear: Tympanic membrane, ear canal and external ear normal.     Mouth/Throat:     Mouth: Mucous membranes are moist.     Pharynx: Oropharynx is clear.  Eyes:     Conjunctiva/sclera: Conjunctivae normal.  Cardiovascular:     Rate and Rhythm: Regular rhythm.     Heart sounds: Normal heart sounds.  Pulmonary:     Effort: Pulmonary effort is normal.     Breath sounds: Normal breath sounds.     Comments: Lungs clear to auscultation Musculoskeletal:     Cervical back: Neck supple.  Skin:    General: Skin is warm.  Neurological:     Mental Status: He is alert and oriented to person, place, and time.  Psychiatric:        Mood and Affect: Mood normal.        Behavior: Behavior normal.        Thought Content: Thought content normal.        Judgment: Judgment normal.     Diagnostics: FVC 2.51 L (63%), FEV1 1.80 L (59%).  Predicted FVC 3.99 L, predicted FEV1 3.04 L.  Spirometry indicates moderately severe restriction.  Spirometry is slightly decreased from previous spirometry  Percutaneous skin testing today was positive to a couple molds.  Intradermal skin testing was positive to: Tree mix, major mold mix 2, major mold mix 4, and mite mix with a good histamine response.  Assessment and Plan: 1. Seasonal and perennial allergic rhinitis   2. Not well controlled severe persistent asthma      Meds ordered this encounter  Medications   cetirizine (ZYRTEC ALLERGY) 10 MG tablet    Sig: Take 1 tablet (10 mg total) by mouth daily.    Dispense:  30 tablet    Refill:  5    Patient Instructions  1. Severe persistent asthma, not well controlled Consider switching to a different biologic if you continue to have symptoms after taking medications as prescribed - Daily controller medication(s): Increase Symbicort two puffs twice daily with spacer( every day) + restartSingulair (montelukast) 46m daily + Nucala 1042mmonthly -Patient cautioned that rarely some children/adults can experience behavioral changes after beginning montelukast. These side effects are rare, however, if you notice any change, notify the clinic and discontinue montelukast. - Rescue medications: albuterol 4 puffs every  4-6 hours as needed - Asthma control goals:  * Full participation in all desired activities (may need albuterol before activity) * Albuterol use two time or less a week on average (not counting use with activity) * Cough interfering with sleep two time or less a month * Oral steroids no more than once a year * No hospitalizations  2.Chronic nonseasonal allergic rhinitis - Continue with azelastine and fluticasone daily.  -An over-the-counter antihistamine such as Claritin, Zyrtec, Allegra, or Xyzal once a day to help with runny nose/drainage down the throat -I will have Dr. Ernst Bowler reformulate your allergy injections.     3. Schedule a follow up appointment in 6 weeks or sooner  Return in about 6 weeks (around 04/22/2022), or if symptoms worsen or fail to improve.    Thank you for the opportunity to care for this patient.  Please do not hesitate to contact me with questions.  Althea Charon, FNP [facility]

## 2022-03-13 ENCOUNTER — Encounter: Payer: Self-pay | Admitting: Family

## 2022-03-13 NOTE — Addendum Note (Signed)
Addended by: Valentina Shaggy on: 03/13/2022 02:33 PM   Modules accepted: Orders

## 2022-03-16 ENCOUNTER — Encounter: Payer: Self-pay | Admitting: Family

## 2022-03-16 DIAGNOSIS — J3089 Other allergic rhinitis: Secondary | ICD-10-CM | POA: Diagnosis not present

## 2022-03-16 NOTE — Progress Notes (Signed)
Aeroallergen Immunotherapy (**NOTE NEW SCRIPT**)   Ordering Provider: Dr. [name]   Patient Details  Name: Caleb Evans  MRN: [id number]  Date of Birth: 1951/01/22   Order 1 of 1   Vial Label: Molds/DM   0.2 ml (Volume)  1:20 Concentration -- Alternaria alternata  0.2 ml (Volume)  1:20 Concentration -- Cladosporium herbarum  0.2 ml (Volume)  1:10 Concentration -- Aspergillus mix  0.2 ml (Volume)  1:10 Concentration -- Penicillium mix  0.2 ml (Volume)  1:10 Concentration -- Fusarium moniliforme  0.2 ml (Volume)  1:40 Concentration -- Aureobasidium pullulans  0.2 ml (Volume)  1:10 Concentration -- Rhizopus oryzae  0.8 ml (Volume)   AU Concentration -- Mite Mix (DF 5,000 & DP 5,000)    2.2  ml Extract Subtotal  2.8  ml Diluent  5.0  ml Maintenance Total   Schedule:  A   Blue Vial (1:100,000): Schedule A (10 doses)  Yellow Vial (1:10,000): Schedule A (10 doses)  Green Vial (1:1,000): Schedule A (10 doses)  Red Vial (1:100): Schedule A (14 doses)   Special Instructions: Please start at [facility] since patient never had molds in his previous vials.

## 2022-03-16 NOTE — Progress Notes (Signed)
VIALS EXP 03-17-23

## 2022-03-24 ENCOUNTER — Ambulatory Visit: Payer: Medicare Other

## 2022-03-31 ENCOUNTER — Ambulatory Visit (INDEPENDENT_AMBULATORY_CARE_PROVIDER_SITE_OTHER): Payer: Medicare Other

## 2022-03-31 VITALS — BP 156/90 | HR 72 | Temp 98.4°F | Resp 18 | Ht 67.0 in | Wt 184.8 lb

## 2022-03-31 DIAGNOSIS — J4550 Severe persistent asthma, uncomplicated: Secondary | ICD-10-CM

## 2022-03-31 MED ORDER — MEPOLIZUMAB 100 MG ~~LOC~~ SOLR
100.0000 mg | Freq: Once | SUBCUTANEOUS | Status: AC
  Administered 2022-03-31: 100 mg via SUBCUTANEOUS
  Filled 2022-03-31: qty 1

## 2022-03-31 NOTE — Progress Notes (Signed)
Diagnosis: Asthma  Provider:  Marshell Garfinkel MD  Procedure: Injection  Nucala (Mepolizumab), Dose: 100 mg, Site: subcutaneous, Number of injections: 1  Discharge: Condition: Good, Destination: Home . AVS provided to patient.   Performed by:  Arnoldo Morale, RN

## 2022-04-01 ENCOUNTER — Telehealth: Payer: Self-pay | Admitting: Acute Care

## 2022-04-01 ENCOUNTER — Other Ambulatory Visit: Payer: Self-pay

## 2022-04-01 ENCOUNTER — Ambulatory Visit: Payer: Medicare Other

## 2022-04-01 DIAGNOSIS — Z87891 Personal history of nicotine dependence: Secondary | ICD-10-CM

## 2022-04-01 DIAGNOSIS — Z122 Encounter for screening for malignant neoplasm of respiratory organs: Secondary | ICD-10-CM

## 2022-04-01 NOTE — Telephone Encounter (Signed)
Reached pt who was driving.  Wants to schedule LDCT, but asked if he could call back tomorrow.  Program number given.

## 2022-04-03 ENCOUNTER — Ambulatory Visit (INDEPENDENT_AMBULATORY_CARE_PROVIDER_SITE_OTHER): Payer: Medicare Other

## 2022-04-03 ENCOUNTER — Ambulatory Visit: Payer: Medicare Other

## 2022-04-03 DIAGNOSIS — J309 Allergic rhinitis, unspecified: Secondary | ICD-10-CM | POA: Diagnosis not present

## 2022-04-06 ENCOUNTER — Ambulatory Visit (INDEPENDENT_AMBULATORY_CARE_PROVIDER_SITE_OTHER): Payer: Medicare Other

## 2022-04-06 ENCOUNTER — Other Ambulatory Visit: Payer: Self-pay

## 2022-04-06 DIAGNOSIS — I639 Cerebral infarction, unspecified: Secondary | ICD-10-CM | POA: Diagnosis not present

## 2022-04-06 LAB — CUP PACEART REMOTE DEVICE CHECK
Date Time Interrogation Session: 20230817230306
Implantable Pulse Generator Implant Date: 20210107

## 2022-04-10 NOTE — Progress Notes (Signed)
Carelink Summary Report / Loop Recorder 

## 2022-04-21 ENCOUNTER — Ambulatory Visit: Payer: Medicare Other

## 2022-04-21 MED ORDER — MEPOLIZUMAB 100 MG ~~LOC~~ SOLR
100.0000 mg | Freq: Once | SUBCUTANEOUS | Status: AC
  Filled 2022-04-21: qty 1

## 2022-04-22 ENCOUNTER — Encounter: Payer: Self-pay | Admitting: Allergy & Immunology

## 2022-04-22 ENCOUNTER — Ambulatory Visit: Payer: Medicare Other | Admitting: Family

## 2022-04-22 ENCOUNTER — Ambulatory Visit (INDEPENDENT_AMBULATORY_CARE_PROVIDER_SITE_OTHER): Payer: Medicare Other | Admitting: Allergy & Immunology

## 2022-04-22 VITALS — BP 130/70 | HR 72 | Temp 97.4°F | Resp 16 | Ht 67.0 in | Wt 187.0 lb

## 2022-04-22 DIAGNOSIS — J3089 Other allergic rhinitis: Secondary | ICD-10-CM

## 2022-04-22 DIAGNOSIS — J4550 Severe persistent asthma, uncomplicated: Secondary | ICD-10-CM | POA: Diagnosis not present

## 2022-04-22 DIAGNOSIS — I639 Cerebral infarction, unspecified: Secondary | ICD-10-CM | POA: Diagnosis not present

## 2022-04-22 DIAGNOSIS — J309 Allergic rhinitis, unspecified: Secondary | ICD-10-CM

## 2022-04-22 NOTE — Patient Instructions (Addendum)
1. Severe persistent asthma, uncomplicated - Spirometry looked a little worse  - We can continue with the Nucala and see how you do over the winter months. - Daily controller medication(s): Symbicort two puffs twice daily with spacer + Singulair (montelukast) 68m daily + Nucala 1017mmonthly - Rescue medications: albuterol 4 puffs every 4-6 hours as needed - Asthma control goals:  * Full participation in all desired activities (may need albuterol before activity) * Albuterol use two time or less a week on average (not counting use with activity) * Cough interfering with sleep two time or less a month * Oral steroids no more than once a year * No hospitalizations  2.Chronic nonseasonal allergic rhinitis - Continue with azelastine and fluticasone daily.  - Continue with allergy shots at the same schedule. - Mold vial started today (come 1-2 times per week to build up quicker). - We are going to order another vial just with tree mix, which we can start next week (since we are starting a new vial) [facility]   3. Return in about 3 months (around 07/22/2022).    Please inform usKoreaf any Emergency Department visits, hospitalizations, or changes in symptoms. Call usKoreaefore going to the ED for breathing or allergy symptoms since we might be able to fit you in for a sick visit. Feel free to contact usKoreanytime with any questions, problems, or concerns.  It was a pleasure to see you again today!   Websites that have reliable patient information: 1. American Academy of Asthma, Allergy, and Immunology: www.aaaai.org 2. Food Allergy Research and Education (FARE): foodallergy.org 3. Mothers of Asthmatics: http://www.asthmacommunitynetwork.org 4. American College of Allergy, Asthma, and Immunology: www.acaai.org  "Like" usKorean Facebook and Instagram for our latest updates!      Make sure you are registered to vote! If you have moved or changed any of your contact information, you will need  to get this updated before voting!  In some cases, you MAY be able to register to vote online: [email or web address]

## 2022-04-22 NOTE — Progress Notes (Signed)
FOLLOW UP  Date of Service/Encounter:  04/22/22   Assessment:   Severe persistent asthma, uncomplicated - doing well on Nucala monthly (administered by Pulmonology)   Chronic nonseasonal allergic rhinitis (trees, mold, dust mite) - on allergen immunotherapy with maintenance reached May 2018 for the TREE VIAL and now just starting the mold/DM vial   Previous World Location manager   Recent stroke - with full recovery    2 years smoke free - although he admits that he cheats occasionally    Plan/Recommendations:   1. Severe persistent asthma, uncomplicated - Spirometry looked a little worse  - We can continue with the Nucala and see how you do over the winter months. - Daily controller medication(s): Symbicort two puffs twice daily with spacer + Singulair (montelukast) 31m daily + Nucala 1059mmonthly - Rescue medications: albuterol 4 puffs every 4-6 hours as needed - Asthma control goals:  * Full participation in all desired activities (may need albuterol before activity) * Albuterol use two time or less a week on average (not counting use with activity) * Cough interfering with sleep two time or less a month * Oral steroids no more than once a year * No hospitalizations  2.Chronic nonseasonal allergic rhinitis - Continue with azelastine and fluticasone daily.  - Continue with allergy shots at the same schedule. - Mold vial started today (come 1-2 times per week to build up quicker). - We are going to order another vial just with tree mix, which [facility] are [facility]   3. Return in about 3 months (around 07/22/2022).   Subjective:   Caleb ICEs a [age].o. male presenting today for follow up of  Chief Complaint  Patient presents with   Immunotherapy    Patient states he can not tell any difference yet.    Caleb Holteras a history of the following: Patient Active Problem List    Diagnosis Date Noted   Cryptogenic stroke ([facility]/02/2020   Medication management 06/13/2019   CVA (cerebral vascular accident) ([facility] [place]/19/2020   Former smoker 05/29/2019   Aneurysm of thoracic aorta ([facility]04/15/2019   Hyperlipidemia 11/22/2017   AAA (abdominal aortic aneurysm) without rupture ([facility] [place]/15/2019   Seasonal and perennial allergic rhinitis 04/19/2017   Severe persistent asthma, uncomplicated 0891/63/8466 GERD (gastroesophageal reflux disease) 04/08/2016   Chronic nonseasonal allergic rhinitis due to fungal spores 01/17/2014   Asthma-COPD overlap syndrome ([facility]06/05/2014   Mild chronic obstructive pulmonary disease ([facility]CS[place]06/05/2014    History obtained from: chart review and patient.  LaNareshs a [age].o. male presenting for a follow up visit.  He was last seen in August 2023 by ChAlthea Charon At that time, we talked about switching to another biologic, but he wanted to continue with Nucala.  We continue with the Symbicort 2 puffs twice daily and restarted his montelukast.  For his allergic rhinitis, we continued with Astelin and Flonase.  He did make the decision to restart allergy shots to incorporate his mold allergies which he has not developed since his first testing.  Since last visit, he has remained the same.   Asthma/Respiratory Symptom History: This is "normal summer breathing". He remains on the Symbicort two puffs twice daily. He rarely uses only the Symbicort. He was supposed to get his Nucala yesterday.  [place] apparently was [place] the beach [place] condo.  I believe this  is on [employer].  His spirometry values over time have decreased.  He sees pulmonology around once per year maybe.  Allergic Rhinitis Symptom History: He just started  the new vial.  This note contains molds and he is starting at the very beginning since he has never had molds in his allergy vials from last.  He remains on his nose sprays, including Astelin and Flonase.  His new allergy shot vial  contains Alternaria, Cladosporium, Aspergillus, penicillium, Fusarium, Aureobasidium, Rhizopus, and dust mites.  His testing in August was still positive to tree mix, otherwise he had loss sensitizations to the weeds.  Otherwise, there have been no changes to his past medical history, surgical history, family history, or social history.    Review of Systems  Constitutional: Negative.  Negative for fever, malaise/fatigue and weight loss.  HENT: Negative.  Negative for congestion, ear discharge, ear pain and sinus pain.   Eyes:  Negative for pain, discharge and redness.  Respiratory:  Positive for shortness of breath. Negative for cough, sputum production and wheezing.   Cardiovascular: Negative.  Negative for chest pain and palpitations.  Gastrointestinal:  Negative for abdominal pain, heartburn, nausea and vomiting.  Skin: Negative.  Negative for itching and rash.  Neurological:  Negative for dizziness and headaches.  Endo/Heme/Allergies:  Negative for environmental allergies. Does not bruise/bleed easily.       Objective:   Blood pressure 130/70, pulse 72, temperature (!) 97.4 F (36.3 C), resp. rate 16, height 5' 7"  (1.702 m), weight 187 lb (84.8 kg), SpO2 97 %. Body mass index is 29.29 kg/m.    Physical Exam Vitals reviewed.  Constitutional:      Appearance: He is well-developed.     Comments: Very talkative.  Boisterous.  HENT:     Head: Normocephalic and atraumatic.     Right Ear: Tympanic membrane, ear canal and external ear normal.     Left Ear: Tympanic membrane, ear canal and external ear normal.     Nose: No nasal deformity, septal deviation, mucosal edema or rhinorrhea.     Right Turbinates: Enlarged, swollen and pale.     Left Turbinates: Enlarged, swollen and pale.     Right Sinus: No maxillary sinus tenderness or frontal sinus tenderness.     Left Sinus: No maxillary sinus tenderness or frontal sinus tenderness.     Mouth/Throat:     Mouth: Mucous membranes  are not pale and not dry.     Pharynx: Uvula midline.  Eyes:     General: Lids are normal. No allergic shiner.       Right eye: No discharge.        Left eye: No discharge.     Conjunctiva/sclera: Conjunctivae normal.     Right eye: Right conjunctiva is not injected. No chemosis.    Left eye: Left conjunctiva is not injected. No chemosis.    Pupils: Pupils are equal, round, and reactive to light.  Cardiovascular:     Rate and Rhythm: Normal rate and regular rhythm.     Heart sounds: Normal heart sounds.  Pulmonary:     Effort: Pulmonary effort is normal. No tachypnea, accessory muscle usage or respiratory distress.     Breath sounds: Normal breath sounds. No wheezing, rhonchi or rales.     Comments: Moving air well in all lung fields.  No increased work of breathing. Chest:     Chest wall: No tenderness.  Lymphadenopathy:     Cervical: No cervical adenopathy.  Skin:  General: Skin is warm.     Capillary Refill: Capillary refill takes less than 2 seconds.     Coloration: Skin is not pale.     Findings: No abrasion, erythema, petechiae or rash. Rash is not papular, urticarial or vesicular.     Comments: No eczematous or urticarial lesions noted.  Neurological:     Mental Status: He is alert.  Psychiatric:        Behavior: Behavior is cooperative.      Diagnostic studies:    Spirometry: results abnormal (FEV1: 1.75/60%, FVC: 2.52/65%, FEV1/FVC: 69%).    Spirometry consistent with possible restrictive disease.   Allergy Studies: none       [name], MD  [facility]

## 2022-04-23 ENCOUNTER — Encounter: Payer: Self-pay | Admitting: Pulmonary Disease

## 2022-04-24 ENCOUNTER — Ambulatory Visit (INDEPENDENT_AMBULATORY_CARE_PROVIDER_SITE_OTHER): Payer: Medicare Other

## 2022-04-24 ENCOUNTER — Encounter: Payer: Self-pay | Admitting: Allergy & Immunology

## 2022-04-24 DIAGNOSIS — J309 Allergic rhinitis, unspecified: Secondary | ICD-10-CM | POA: Diagnosis not present

## 2022-04-27 ENCOUNTER — Ambulatory Visit
Admission: RE | Admit: 2022-04-27 | Discharge: 2022-04-27 | Disposition: A | Discharge status: Home / Self Care | Payer: Medicare Other | Source: Ambulatory Visit | Attending: Acute Care | Admitting: Acute Care

## 2022-04-27 DIAGNOSIS — Z122 Encounter for screening for malignant neoplasm of respiratory organs: Secondary | ICD-10-CM | POA: Insufficient documentation

## 2022-04-27 DIAGNOSIS — Z87891 Personal history of nicotine dependence: Secondary | ICD-10-CM | POA: Diagnosis present

## 2022-04-27 NOTE — Progress Notes (Signed)
Aeroallergen Immunotherapy   Ordering Provider: Dr. [name]   Patient Details  Name: OMAIR DETTMER  MRN: [id number]  Date of Birth: [birth year]   Order 1 of 1   Vial Label: Trees   0.8 ml (Volume)  1:20 Concentration -- Eastern 10 Tree Mix (also Sweet Gum)    0.8  ml Extract Subtotal  4.2  ml Diluent  5.0  ml Maintenance Total   Schedule:  C   Red Vial (1:100): Schedule C (5 doses)   Special Instructions: Start at 0.025 mL and then advance on Schedule C. Ok to space out to every 4 weeks once he reaches 0.5 mL.

## 2022-04-28 ENCOUNTER — Ambulatory Visit (INDEPENDENT_AMBULATORY_CARE_PROVIDER_SITE_OTHER): Payer: Medicare Other

## 2022-04-28 VITALS — BP 142/84 | HR 67 | Temp 98.2°F | Resp 18 | Ht 67.0 in | Wt 186.0 lb

## 2022-04-28 DIAGNOSIS — J4550 Severe persistent asthma, uncomplicated: Secondary | ICD-10-CM | POA: Diagnosis not present

## 2022-04-28 MED ORDER — MEPOLIZUMAB 100 MG ~~LOC~~ SOLR
100.0000 mg | Freq: Once | SUBCUTANEOUS | Status: AC
  Administered 2022-04-28: 100 mg via SUBCUTANEOUS
  Filled 2022-04-28: qty 1

## 2022-04-28 NOTE — Progress Notes (Signed)
Diagnosis: Asthma  Provider:  Marshell Garfinkel MD  Procedure: Injection  Nucala (Mepolizumab), Dose: 100 mg, Site: subcutaneous, Number of injections: 1  Discharge: Condition: Good, Destination: Home . AVS provided to patient.   Performed by:  Arnoldo Morale, RN

## 2022-04-29 ENCOUNTER — Other Ambulatory Visit: Payer: Self-pay

## 2022-04-29 ENCOUNTER — Other Ambulatory Visit: Payer: Self-pay | Admitting: Family Medicine

## 2022-04-29 DIAGNOSIS — Z87891 Personal history of nicotine dependence: Secondary | ICD-10-CM

## 2022-04-29 DIAGNOSIS — M25512 Pain in left shoulder: Secondary | ICD-10-CM

## 2022-04-29 DIAGNOSIS — Z122 Encounter for screening for malignant neoplasm of respiratory organs: Secondary | ICD-10-CM

## 2022-04-30 NOTE — Progress Notes (Signed)
Carelink Summary Report / Loop Recorder 

## 2022-05-11 ENCOUNTER — Ambulatory Visit (INDEPENDENT_AMBULATORY_CARE_PROVIDER_SITE_OTHER): Payer: Medicare Other

## 2022-05-11 DIAGNOSIS — I639 Cerebral infarction, unspecified: Secondary | ICD-10-CM | POA: Diagnosis not present

## 2022-05-12 LAB — CUP PACEART REMOTE DEVICE CHECK
Date Time Interrogation Session: 20231001230205
Implantable Pulse Generator Implant Date: 20210107

## 2022-05-15 ENCOUNTER — Ambulatory Visit (HOSPITAL_COMMUNITY)
Admission: RE | Admit: 2022-05-15 | Discharge: 2022-05-15 | Disposition: A | Discharge status: Home / Self Care | Payer: Medicare Other | Source: Ambulatory Visit | Attending: Family Medicine | Admitting: Family Medicine

## 2022-05-15 ENCOUNTER — Ambulatory Visit (INDEPENDENT_AMBULATORY_CARE_PROVIDER_SITE_OTHER): Payer: Medicare Other

## 2022-05-15 DIAGNOSIS — J309 Allergic rhinitis, unspecified: Secondary | ICD-10-CM | POA: Diagnosis not present

## 2022-05-15 DIAGNOSIS — M25512 Pain in left shoulder: Secondary | ICD-10-CM | POA: Insufficient documentation

## 2022-05-19 ENCOUNTER — Ambulatory Visit: Payer: Medicare Other

## 2022-05-19 DIAGNOSIS — J301 Allergic rhinitis due to pollen: Secondary | ICD-10-CM | POA: Diagnosis not present

## 2022-05-19 NOTE — Progress Notes (Signed)
VIAL EXP 05-20-23

## 2022-05-22 ENCOUNTER — Ambulatory Visit (INDEPENDENT_AMBULATORY_CARE_PROVIDER_SITE_OTHER): Payer: Medicare Other

## 2022-05-22 DIAGNOSIS — J309 Allergic rhinitis, unspecified: Secondary | ICD-10-CM

## 2022-05-26 ENCOUNTER — Ambulatory Visit (INDEPENDENT_AMBULATORY_CARE_PROVIDER_SITE_OTHER): Payer: Medicare Other

## 2022-05-26 VITALS — BP 152/88 | HR 77 | Temp 97.9°F | Resp 16 | Ht 67.0 in | Wt 187.0 lb

## 2022-05-26 DIAGNOSIS — J4550 Severe persistent asthma, uncomplicated: Secondary | ICD-10-CM | POA: Diagnosis not present

## 2022-05-26 MED ORDER — MEPOLIZUMAB 100 MG ~~LOC~~ SOLR
100.0000 mg | Freq: Once | SUBCUTANEOUS | Status: AC
  Administered 2022-05-26: 100 mg via SUBCUTANEOUS
  Filled 2022-05-26: qty 1

## 2022-05-26 NOTE — Progress Notes (Signed)
Diagnosis: Asthma  Provider:  Marshell Garfinkel MD  Procedure: Injection  Nucala (Mepolizumab), Dose: 100 mg, Site: subcutaneous, Number of injections: 1  Discharge: Condition: Good, Destination: Home . AVS provided to patient.   Performed by:  Koren Shiver, RN

## 2022-05-27 NOTE — Progress Notes (Signed)
Carelink Summary Report / Loop Recorder 

## 2022-05-29 ENCOUNTER — Ambulatory Visit (INDEPENDENT_AMBULATORY_CARE_PROVIDER_SITE_OTHER): Payer: Medicare Other

## 2022-05-29 DIAGNOSIS — J309 Allergic rhinitis, unspecified: Secondary | ICD-10-CM

## 2022-06-05 ENCOUNTER — Ambulatory Visit (INDEPENDENT_AMBULATORY_CARE_PROVIDER_SITE_OTHER): Payer: Medicare Other

## 2022-06-05 ENCOUNTER — Other Ambulatory Visit: Payer: Self-pay | Admitting: Allergy & Immunology

## 2022-06-05 DIAGNOSIS — J309 Allergic rhinitis, unspecified: Secondary | ICD-10-CM | POA: Diagnosis not present

## 2022-06-10 ENCOUNTER — Ambulatory Visit (INDEPENDENT_AMBULATORY_CARE_PROVIDER_SITE_OTHER): Payer: Medicare Other | Admitting: *Deleted

## 2022-06-10 DIAGNOSIS — J309 Allergic rhinitis, unspecified: Secondary | ICD-10-CM | POA: Diagnosis not present

## 2022-06-10 NOTE — Progress Notes (Signed)
VIALS EXP 06-11-23.  PREVIOUS AUGUST SET OF [place]. NO CHARGE.

## 2022-06-15 ENCOUNTER — Ambulatory Visit (INDEPENDENT_AMBULATORY_CARE_PROVIDER_SITE_OTHER): Payer: Medicare Other

## 2022-06-15 DIAGNOSIS — I639 Cerebral infarction, unspecified: Secondary | ICD-10-CM

## 2022-06-16 ENCOUNTER — Ambulatory Visit: Payer: Medicare Other

## 2022-06-17 LAB — CUP PACEART REMOTE DEVICE CHECK
Date Time Interrogation Session: 20231103230445
Implantable Pulse Generator Implant Date: 20210107

## 2022-06-23 ENCOUNTER — Ambulatory Visit: Payer: Medicare Other

## 2022-06-26 ENCOUNTER — Ambulatory Visit (INDEPENDENT_AMBULATORY_CARE_PROVIDER_SITE_OTHER): Payer: Medicare Other | Admitting: *Deleted

## 2022-06-26 DIAGNOSIS — J309 Allergic rhinitis, unspecified: Secondary | ICD-10-CM

## 2022-06-30 ENCOUNTER — Ambulatory Visit (INDEPENDENT_AMBULATORY_CARE_PROVIDER_SITE_OTHER): Payer: Medicare Other | Admitting: *Deleted

## 2022-06-30 VITALS — BP 153/89 | HR 62 | Temp 97.3°F | Resp 16 | Ht 67.0 in | Wt 191.4 lb

## 2022-06-30 DIAGNOSIS — J4550 Severe persistent asthma, uncomplicated: Secondary | ICD-10-CM | POA: Diagnosis not present

## 2022-06-30 MED ORDER — MEPOLIZUMAB 100 MG ~~LOC~~ SOLR
100.0000 mg | Freq: Once | SUBCUTANEOUS | Status: AC
  Administered 2022-06-30: 100 mg via SUBCUTANEOUS
  Filled 2022-06-30: qty 1

## 2022-06-30 NOTE — Progress Notes (Signed)
Diagnosis: Asthma  Provider:  Marshell Garfinkel MD  Procedure: Injection  Nucala (Mepolizumab), Dose: 100 mg, Site: subcutaneous, Number of injections: 1  Post Care: Observation period completed  Discharge: Condition: Good, Destination: Home . AVS provided to patient.   Performed by:  Oren Beckmann, RN

## 2022-07-08 ENCOUNTER — Ambulatory Visit (INDEPENDENT_AMBULATORY_CARE_PROVIDER_SITE_OTHER): Payer: Medicare Other

## 2022-07-08 DIAGNOSIS — J309 Allergic rhinitis, unspecified: Secondary | ICD-10-CM | POA: Diagnosis not present

## 2022-07-10 ENCOUNTER — Ambulatory Visit (INDEPENDENT_AMBULATORY_CARE_PROVIDER_SITE_OTHER): Payer: Medicare Other

## 2022-07-10 DIAGNOSIS — J309 Allergic rhinitis, unspecified: Secondary | ICD-10-CM

## 2022-07-14 ENCOUNTER — Ambulatory Visit: Payer: Medicare Other

## 2022-07-15 ENCOUNTER — Ambulatory Visit (INDEPENDENT_AMBULATORY_CARE_PROVIDER_SITE_OTHER): Payer: Medicare Other

## 2022-07-15 DIAGNOSIS — J309 Allergic rhinitis, unspecified: Secondary | ICD-10-CM | POA: Diagnosis not present

## 2022-07-17 ENCOUNTER — Ambulatory Visit (INDEPENDENT_AMBULATORY_CARE_PROVIDER_SITE_OTHER): Payer: Medicare Other

## 2022-07-17 DIAGNOSIS — J309 Allergic rhinitis, unspecified: Secondary | ICD-10-CM | POA: Diagnosis not present

## 2022-07-20 ENCOUNTER — Ambulatory Visit (INDEPENDENT_AMBULATORY_CARE_PROVIDER_SITE_OTHER): Payer: Medicare Other

## 2022-07-20 DIAGNOSIS — I639 Cerebral infarction, unspecified: Secondary | ICD-10-CM

## 2022-07-21 ENCOUNTER — Ambulatory Visit (INDEPENDENT_AMBULATORY_CARE_PROVIDER_SITE_OTHER): Payer: Medicare Other

## 2022-07-21 VITALS — BP 134/83 | HR 81 | Temp 97.8°F | Resp 20 | Ht 67.0 in | Wt 189.8 lb

## 2022-07-21 DIAGNOSIS — J4550 Severe persistent asthma, uncomplicated: Secondary | ICD-10-CM

## 2022-07-21 LAB — CUP PACEART REMOTE DEVICE CHECK
Date Time Interrogation Session: 20231210230308
Implantable Pulse Generator Implant Date: 20210107

## 2022-07-21 MED ORDER — MEPOLIZUMAB 100 MG ~~LOC~~ SOLR
100.0000 mg | Freq: Once | SUBCUTANEOUS | Status: AC
  Administered 2022-07-21: 100 mg via SUBCUTANEOUS
  Filled 2022-07-21: qty 1

## 2022-07-21 NOTE — Progress Notes (Signed)
Diagnosis: Asthma  Provider:  Marshell Garfinkel MD  Procedure: Injection  Nucala (Mepolizumab), Dose: 100 mg, Site: subcutaneous, Number of injections: 1   Discharge: Condition: Good, Destination: Home . AVS provided to patient.   Performed by:  Koren Shiver, RN

## 2022-07-22 NOTE — Progress Notes (Signed)
Carelink Summary Report / Loop Recorder 

## 2022-07-24 ENCOUNTER — Ambulatory Visit (INDEPENDENT_AMBULATORY_CARE_PROVIDER_SITE_OTHER): Payer: Medicare Other

## 2022-07-24 DIAGNOSIS — J309 Allergic rhinitis, unspecified: Secondary | ICD-10-CM

## 2022-07-29 ENCOUNTER — Ambulatory Visit (INDEPENDENT_AMBULATORY_CARE_PROVIDER_SITE_OTHER): Payer: Medicare Other

## 2022-07-29 DIAGNOSIS — J309 Allergic rhinitis, unspecified: Secondary | ICD-10-CM | POA: Diagnosis not present

## 2022-08-05 ENCOUNTER — Ambulatory Visit (INDEPENDENT_AMBULATORY_CARE_PROVIDER_SITE_OTHER): Payer: Medicare Other

## 2022-08-05 DIAGNOSIS — J309 Allergic rhinitis, unspecified: Secondary | ICD-10-CM | POA: Diagnosis not present

## 2022-08-07 ENCOUNTER — Ambulatory Visit (INDEPENDENT_AMBULATORY_CARE_PROVIDER_SITE_OTHER): Payer: Medicare Other

## 2022-08-07 DIAGNOSIS — J309 Allergic rhinitis, unspecified: Secondary | ICD-10-CM | POA: Diagnosis not present

## 2022-08-18 ENCOUNTER — Ambulatory Visit: Payer: Medicare Other

## 2022-08-24 ENCOUNTER — Ambulatory Visit (INDEPENDENT_AMBULATORY_CARE_PROVIDER_SITE_OTHER): Payer: Medicare Other

## 2022-08-24 ENCOUNTER — Other Ambulatory Visit: Payer: Self-pay | Admitting: Allergy & Immunology

## 2022-08-24 DIAGNOSIS — I639 Cerebral infarction, unspecified: Secondary | ICD-10-CM | POA: Diagnosis not present

## 2022-08-25 ENCOUNTER — Ambulatory Visit (INDEPENDENT_AMBULATORY_CARE_PROVIDER_SITE_OTHER): Payer: Medicare Other

## 2022-08-25 VITALS — BP 153/89 | HR 77 | Temp 97.9°F | Resp 16 | Ht 67.0 in | Wt 189.2 lb

## 2022-08-25 DIAGNOSIS — J4550 Severe persistent asthma, uncomplicated: Secondary | ICD-10-CM

## 2022-08-25 MED ORDER — MEPOLIZUMAB 100 MG ~~LOC~~ SOLR
100.0000 mg | Freq: Once | SUBCUTANEOUS | Status: AC
  Administered 2022-08-25: 100 mg via SUBCUTANEOUS
  Filled 2022-08-25: qty 1

## 2022-08-25 NOTE — Progress Notes (Signed)
Diagnosis: Asthma  Provider:  Marshell Garfinkel MD  Procedure: Injection  Nucala (Mepolizumab), Dose: 100 mg, Site: subcutaneous, Number of injections: 1  Post Care:  na  Discharge: Condition: Good, Destination: Home . AVS provided to patient.   Performed by:  Adelina Mings, LPN

## 2022-08-26 LAB — CUP PACEART REMOTE DEVICE CHECK
Date Time Interrogation Session: 20240112230603
Implantable Pulse Generator Implant Date: 20210107

## 2022-08-27 NOTE — Progress Notes (Signed)
Carelink Summary Report / Loop Recorder

## 2022-08-28 ENCOUNTER — Ambulatory Visit (INDEPENDENT_AMBULATORY_CARE_PROVIDER_SITE_OTHER): Payer: Medicare Other

## 2022-08-28 DIAGNOSIS — J309 Allergic rhinitis, unspecified: Secondary | ICD-10-CM

## 2022-09-02 ENCOUNTER — Ambulatory Visit (INDEPENDENT_AMBULATORY_CARE_PROVIDER_SITE_OTHER): Payer: Medicare Other

## 2022-09-02 DIAGNOSIS — J309 Allergic rhinitis, unspecified: Secondary | ICD-10-CM

## 2022-09-04 ENCOUNTER — Ambulatory Visit (INDEPENDENT_AMBULATORY_CARE_PROVIDER_SITE_OTHER): Payer: Medicare Other

## 2022-09-04 DIAGNOSIS — J309 Allergic rhinitis, unspecified: Secondary | ICD-10-CM | POA: Diagnosis not present

## 2022-09-09 ENCOUNTER — Ambulatory Visit (INDEPENDENT_AMBULATORY_CARE_PROVIDER_SITE_OTHER): Payer: Medicare Other

## 2022-09-09 DIAGNOSIS — J309 Allergic rhinitis, unspecified: Secondary | ICD-10-CM | POA: Diagnosis not present

## 2022-09-15 ENCOUNTER — Ambulatory Visit: Payer: Medicare Other

## 2022-09-22 ENCOUNTER — Ambulatory Visit: Payer: Medicare Other

## 2022-09-27 LAB — CUP PACEART REMOTE DEVICE CHECK
Date Time Interrogation Session: 20240214230436
Implantable Pulse Generator Implant Date: 20210107

## 2022-09-28 ENCOUNTER — Ambulatory Visit (INDEPENDENT_AMBULATORY_CARE_PROVIDER_SITE_OTHER): Payer: Medicare Other

## 2022-09-28 VITALS — BP 161/104 | HR 90 | Temp 97.9°F | Resp 18 | Wt 189.4 lb

## 2022-09-28 DIAGNOSIS — I639 Cerebral infarction, unspecified: Secondary | ICD-10-CM | POA: Diagnosis not present

## 2022-09-28 DIAGNOSIS — J4550 Severe persistent asthma, uncomplicated: Secondary | ICD-10-CM | POA: Diagnosis not present

## 2022-09-28 MED ORDER — MEPOLIZUMAB 100 MG ~~LOC~~ SOLR
100.0000 mg | Freq: Once | SUBCUTANEOUS | Status: AC
  Administered 2022-09-28: 100 mg via SUBCUTANEOUS
  Filled 2022-09-28: qty 1

## 2022-09-28 NOTE — Progress Notes (Signed)
Diagnosis: Asthma  Provider:  Marshell Garfinkel MD  Procedure: Injection  Nucala (Mepolizumab), Dose: 100 mg, Site: subcutaneous, Number of injections: 1  Post Care: Patient declined observation  Discharge: Condition: Good, Destination: Home . AVS Provided  Performed by:  Jonelle Sidle, RN

## 2022-09-30 ENCOUNTER — Ambulatory Visit (INDEPENDENT_AMBULATORY_CARE_PROVIDER_SITE_OTHER): Payer: Medicare Other

## 2022-09-30 DIAGNOSIS — J309 Allergic rhinitis, unspecified: Secondary | ICD-10-CM | POA: Diagnosis not present

## 2022-10-07 ENCOUNTER — Ambulatory Visit (INDEPENDENT_AMBULATORY_CARE_PROVIDER_SITE_OTHER): Payer: Medicare Other

## 2022-10-07 DIAGNOSIS — J309 Allergic rhinitis, unspecified: Secondary | ICD-10-CM

## 2022-10-12 NOTE — Progress Notes (Signed)
Carelink Summary Report / Loop Recorder 

## 2022-10-13 ENCOUNTER — Ambulatory Visit: Payer: Medicare Other

## 2022-10-14 ENCOUNTER — Ambulatory Visit (INDEPENDENT_AMBULATORY_CARE_PROVIDER_SITE_OTHER): Payer: Medicare Other

## 2022-10-14 DIAGNOSIS — J309 Allergic rhinitis, unspecified: Secondary | ICD-10-CM

## 2022-10-20 ENCOUNTER — Ambulatory Visit: Payer: Medicare Other

## 2022-10-21 ENCOUNTER — Ambulatory Visit (INDEPENDENT_AMBULATORY_CARE_PROVIDER_SITE_OTHER): Payer: Medicare Other

## 2022-10-21 DIAGNOSIS — J309 Allergic rhinitis, unspecified: Secondary | ICD-10-CM | POA: Diagnosis not present

## 2022-10-27 ENCOUNTER — Ambulatory Visit: Payer: Medicare Other

## 2022-11-02 ENCOUNTER — Ambulatory Visit (INDEPENDENT_AMBULATORY_CARE_PROVIDER_SITE_OTHER): Payer: Medicare Other

## 2022-11-02 DIAGNOSIS — I639 Cerebral infarction, unspecified: Secondary | ICD-10-CM

## 2022-11-02 LAB — CUP PACEART REMOTE DEVICE CHECK
Date Time Interrogation Session: 20240324231234
Implantable Pulse Generator Implant Date: 20210107

## 2022-11-04 ENCOUNTER — Ambulatory Visit (INDEPENDENT_AMBULATORY_CARE_PROVIDER_SITE_OTHER): Payer: Medicare Other

## 2022-11-04 VITALS — BP 122/86 | HR 75 | Temp 97.6°F | Resp 20 | Ht 67.0 in

## 2022-11-04 DIAGNOSIS — J4550 Severe persistent asthma, uncomplicated: Secondary | ICD-10-CM | POA: Diagnosis not present

## 2022-11-04 MED ORDER — MEPOLIZUMAB 100 MG ~~LOC~~ SOLR
100.0000 mg | Freq: Once | SUBCUTANEOUS | Status: AC
  Administered 2022-11-04: 100 mg via SUBCUTANEOUS
  Filled 2022-11-04: qty 1

## 2022-11-04 NOTE — Progress Notes (Signed)
Diagnosis: Asthma  Provider:  Marshell Garfinkel MD  Procedure: Injection  Nucala (Mepolizumab), Dose: 100 mg, Site: subcutaneous, Number of injections: 1  Administered in left arm.  Post Care: Patient declined observation  Discharge: Condition: Good, Destination: Home . AVS Provided  Performed by:  Binnie Kand, RN

## 2022-11-11 ENCOUNTER — Ambulatory Visit (INDEPENDENT_AMBULATORY_CARE_PROVIDER_SITE_OTHER): Payer: Medicare Other

## 2022-11-11 DIAGNOSIS — J309 Allergic rhinitis, unspecified: Secondary | ICD-10-CM

## 2022-11-11 NOTE — Progress Notes (Signed)
Carelink Summary Report / Loop Recorder 

## 2022-11-18 ENCOUNTER — Ambulatory Visit (INDEPENDENT_AMBULATORY_CARE_PROVIDER_SITE_OTHER): Payer: Medicare Other

## 2022-11-18 DIAGNOSIS — J309 Allergic rhinitis, unspecified: Secondary | ICD-10-CM | POA: Diagnosis not present

## 2022-11-27 ENCOUNTER — Ambulatory Visit (INDEPENDENT_AMBULATORY_CARE_PROVIDER_SITE_OTHER): Payer: Medicare Other

## 2022-11-27 DIAGNOSIS — J309 Allergic rhinitis, unspecified: Secondary | ICD-10-CM

## 2022-11-28 ENCOUNTER — Other Ambulatory Visit: Payer: Self-pay | Admitting: Pulmonary Disease

## 2022-12-01 DIAGNOSIS — J301 Allergic rhinitis due to pollen: Secondary | ICD-10-CM | POA: Diagnosis not present

## 2022-12-01 NOTE — Progress Notes (Signed)
VIAL EXP 12-01-23

## 2022-12-02 ENCOUNTER — Encounter: Payer: Self-pay | Admitting: Pulmonary Disease

## 2022-12-02 ENCOUNTER — Ambulatory Visit (INDEPENDENT_AMBULATORY_CARE_PROVIDER_SITE_OTHER): Payer: Medicare Other

## 2022-12-02 VITALS — BP 138/85 | HR 72 | Temp 97.7°F | Resp 16 | Ht 67.0 in | Wt 186.8 lb

## 2022-12-02 DIAGNOSIS — J4550 Severe persistent asthma, uncomplicated: Secondary | ICD-10-CM

## 2022-12-02 MED ORDER — MEPOLIZUMAB 100 MG ~~LOC~~ SOLR
100.0000 mg | Freq: Once | SUBCUTANEOUS | Status: AC
  Administered 2022-12-02: 100 mg via SUBCUTANEOUS
  Filled 2022-12-02: qty 1

## 2022-12-02 NOTE — Progress Notes (Signed)
Diagnosis: Asthma  Provider:  Praveen Mannam MD  Procedure: Injection  Nucala (Mepolizumab), Dose: 100 mg, Site: subcutaneous, Number of injections: 1  Post Care:  n/a  Discharge: Condition: Good, Destination: Home . AVS Provided  Performed by:  Meilech Virts E Christohper Dube, LPN       

## 2022-12-07 ENCOUNTER — Ambulatory Visit (INDEPENDENT_AMBULATORY_CARE_PROVIDER_SITE_OTHER): Payer: Medicare Other

## 2022-12-07 DIAGNOSIS — I639 Cerebral infarction, unspecified: Secondary | ICD-10-CM | POA: Diagnosis not present

## 2022-12-07 LAB — CUP PACEART REMOTE DEVICE CHECK
Date Time Interrogation Session: 20240426230022
Implantable Pulse Generator Implant Date: 20210107

## 2022-12-15 NOTE — Progress Notes (Signed)
Carelink Summary Report / Loop Recorder 

## 2022-12-16 ENCOUNTER — Ambulatory Visit (INDEPENDENT_AMBULATORY_CARE_PROVIDER_SITE_OTHER): Payer: Medicare Other

## 2022-12-16 DIAGNOSIS — J309 Allergic rhinitis, unspecified: Secondary | ICD-10-CM

## 2022-12-17 ENCOUNTER — Other Ambulatory Visit: Payer: Self-pay | Admitting: Pulmonary Disease

## 2022-12-30 ENCOUNTER — Ambulatory Visit (INDEPENDENT_AMBULATORY_CARE_PROVIDER_SITE_OTHER): Payer: Medicare Other

## 2022-12-30 VITALS — BP 117/73 | HR 55 | Temp 97.9°F | Resp 16 | Ht 67.0 in | Wt 183.0 lb

## 2022-12-30 DIAGNOSIS — J4550 Severe persistent asthma, uncomplicated: Secondary | ICD-10-CM | POA: Diagnosis not present

## 2022-12-30 MED ORDER — MEPOLIZUMAB 100 MG ~~LOC~~ SOLR
100.0000 mg | Freq: Once | SUBCUTANEOUS | Status: AC
  Administered 2022-12-30: 100 mg via SUBCUTANEOUS
  Filled 2022-12-30: qty 1

## 2022-12-30 NOTE — Progress Notes (Signed)
Diagnosis: Asthma  Provider:  Chilton Greathouse MD  Procedure: Injection  Nucala (Mepolizumab), Dose: 100 mg, Site: subcutaneous, Number of injections: 1  Post Care:  n/a  Discharge: Condition: Good, Destination: Home . AVS Provided  Performed by:  Loney Hering, LPN

## 2023-01-06 ENCOUNTER — Ambulatory Visit (INDEPENDENT_AMBULATORY_CARE_PROVIDER_SITE_OTHER): Payer: Medicare Other

## 2023-01-06 DIAGNOSIS — I639 Cerebral infarction, unspecified: Secondary | ICD-10-CM | POA: Diagnosis not present

## 2023-01-06 DIAGNOSIS — J309 Allergic rhinitis, unspecified: Secondary | ICD-10-CM

## 2023-01-07 LAB — CUP PACEART REMOTE DEVICE CHECK
Date Time Interrogation Session: 20240529230441
Implantable Pulse Generator Implant Date: 20210107

## 2023-01-08 NOTE — Progress Notes (Signed)
Carelink Summary Report / Loop Recorder 

## 2023-01-22 ENCOUNTER — Ambulatory Visit (INDEPENDENT_AMBULATORY_CARE_PROVIDER_SITE_OTHER): Payer: Medicare Other

## 2023-01-22 DIAGNOSIS — J309 Allergic rhinitis, unspecified: Secondary | ICD-10-CM

## 2023-01-27 ENCOUNTER — Ambulatory Visit (INDEPENDENT_AMBULATORY_CARE_PROVIDER_SITE_OTHER): Payer: Medicare Other

## 2023-01-27 VITALS — BP 130/83 | HR 69 | Temp 98.1°F | Ht 67.0 in | Wt 185.0 lb

## 2023-01-27 DIAGNOSIS — J4550 Severe persistent asthma, uncomplicated: Secondary | ICD-10-CM

## 2023-01-27 MED ORDER — MEPOLIZUMAB 100 MG ~~LOC~~ SOLR
100.0000 mg | Freq: Once | SUBCUTANEOUS | Status: AC
  Administered 2023-01-27: 100 mg via SUBCUTANEOUS
  Filled 2023-01-27: qty 1

## 2023-01-27 NOTE — Progress Notes (Cosign Needed Addendum)
Diagnosis: Asthma  Provider:  Chilton Greathouse MD  Procedure: Injection  Nucala (Mepolizumab), Dose: 100 mg, Site: subcutaneous, Number of injections: 1  Post Care: Patient declined observation  Discharge: Condition: Stable, Destination: Home . AVS Provided  Performed by:  Wyvonne Lenz, RN

## 2023-01-28 NOTE — Progress Notes (Signed)
Carelink Summary Report / Loop Recorder 

## 2023-01-29 ENCOUNTER — Ambulatory Visit (INDEPENDENT_AMBULATORY_CARE_PROVIDER_SITE_OTHER): Payer: Medicare Other

## 2023-01-29 DIAGNOSIS — J309 Allergic rhinitis, unspecified: Secondary | ICD-10-CM | POA: Diagnosis not present

## 2023-02-03 ENCOUNTER — Ambulatory Visit (INDEPENDENT_AMBULATORY_CARE_PROVIDER_SITE_OTHER): Payer: Medicare Other

## 2023-02-03 DIAGNOSIS — J309 Allergic rhinitis, unspecified: Secondary | ICD-10-CM

## 2023-02-08 ENCOUNTER — Ambulatory Visit (INDEPENDENT_AMBULATORY_CARE_PROVIDER_SITE_OTHER): Payer: Medicare Other

## 2023-02-08 DIAGNOSIS — I639 Cerebral infarction, unspecified: Secondary | ICD-10-CM | POA: Diagnosis not present

## 2023-02-09 LAB — CUP PACEART REMOTE DEVICE CHECK
Date Time Interrogation Session: 20240701230927
Implantable Pulse Generator Implant Date: 20210107

## 2023-02-10 ENCOUNTER — Ambulatory Visit (INDEPENDENT_AMBULATORY_CARE_PROVIDER_SITE_OTHER): Payer: Medicare Other

## 2023-02-10 DIAGNOSIS — J309 Allergic rhinitis, unspecified: Secondary | ICD-10-CM

## 2023-02-24 ENCOUNTER — Ambulatory Visit (INDEPENDENT_AMBULATORY_CARE_PROVIDER_SITE_OTHER): Payer: Medicare Other

## 2023-02-24 ENCOUNTER — Other Ambulatory Visit: Payer: Self-pay | Admitting: Pulmonary Disease

## 2023-02-24 DIAGNOSIS — J309 Allergic rhinitis, unspecified: Secondary | ICD-10-CM | POA: Diagnosis not present

## 2023-02-26 ENCOUNTER — Ambulatory Visit: Payer: Medicare Other

## 2023-03-01 NOTE — Progress Notes (Signed)
Carelink Summary Report / Loop Recorder 

## 2023-03-03 ENCOUNTER — Ambulatory Visit (INDEPENDENT_AMBULATORY_CARE_PROVIDER_SITE_OTHER): Payer: Medicare Other

## 2023-03-03 VITALS — BP 128/80 | HR 69 | Temp 97.9°F | Resp 16 | Ht 67.0 in | Wt 184.8 lb

## 2023-03-03 DIAGNOSIS — J4550 Severe persistent asthma, uncomplicated: Secondary | ICD-10-CM

## 2023-03-03 MED ORDER — MEPOLIZUMAB 100 MG ~~LOC~~ SOLR
100.0000 mg | Freq: Once | SUBCUTANEOUS | Status: AC
  Administered 2023-03-03: 100 mg via SUBCUTANEOUS
  Filled 2023-03-03: qty 1

## 2023-03-03 NOTE — Progress Notes (Signed)
Diagnosis: Asthma  Provider:  Chilton Greathouse MD  Procedure: Injection  Nucala (Mepolizumab), Dose: 100 mg, Site: subcutaneous, Number of injections: 1  Administered in left arm.  Post Care: Patient declined observation  Discharge: Condition: Good, Destination: Home . AVS Provided  Performed by:  Wyvonne Lenz, RN

## 2023-03-05 ENCOUNTER — Ambulatory Visit: Payer: Self-pay

## 2023-03-05 DIAGNOSIS — J309 Allergic rhinitis, unspecified: Secondary | ICD-10-CM | POA: Diagnosis not present

## 2023-03-08 ENCOUNTER — Other Ambulatory Visit: Payer: Self-pay | Admitting: Pulmonary Disease

## 2023-03-10 ENCOUNTER — Ambulatory Visit (INDEPENDENT_AMBULATORY_CARE_PROVIDER_SITE_OTHER): Payer: Medicare Other

## 2023-03-10 DIAGNOSIS — J309 Allergic rhinitis, unspecified: Secondary | ICD-10-CM

## 2023-03-14 LAB — CUP PACEART REMOTE DEVICE CHECK
Date Time Interrogation Session: 20240803230217
Implantable Pulse Generator Implant Date: 20210107

## 2023-03-15 ENCOUNTER — Ambulatory Visit (INDEPENDENT_AMBULATORY_CARE_PROVIDER_SITE_OTHER): Payer: Medicare Other

## 2023-03-15 DIAGNOSIS — I639 Cerebral infarction, unspecified: Secondary | ICD-10-CM | POA: Diagnosis not present

## 2023-03-19 ENCOUNTER — Ambulatory Visit (INDEPENDENT_AMBULATORY_CARE_PROVIDER_SITE_OTHER): Payer: Medicare Other

## 2023-03-19 DIAGNOSIS — J309 Allergic rhinitis, unspecified: Secondary | ICD-10-CM

## 2023-03-30 NOTE — Progress Notes (Signed)
Carelink Summary Report / Loop Recorder 

## 2023-03-31 ENCOUNTER — Ambulatory Visit (INDEPENDENT_AMBULATORY_CARE_PROVIDER_SITE_OTHER): Payer: Medicare Other

## 2023-03-31 DIAGNOSIS — J309 Allergic rhinitis, unspecified: Secondary | ICD-10-CM | POA: Diagnosis not present

## 2023-04-01 ENCOUNTER — Ambulatory Visit (INDEPENDENT_AMBULATORY_CARE_PROVIDER_SITE_OTHER): Payer: Medicare Other

## 2023-04-01 VITALS — BP 126/79 | HR 83 | Temp 97.8°F | Resp 16 | Ht 67.0 in | Wt 187.2 lb

## 2023-04-01 DIAGNOSIS — J4550 Severe persistent asthma, uncomplicated: Secondary | ICD-10-CM | POA: Diagnosis not present

## 2023-04-01 MED ORDER — MEPOLIZUMAB 100 MG ~~LOC~~ SOLR
100.0000 mg | Freq: Once | SUBCUTANEOUS | Status: AC
  Administered 2023-04-01: 100 mg via SUBCUTANEOUS
  Filled 2023-04-01: qty 1

## 2023-04-01 NOTE — Progress Notes (Signed)
Diagnosis: Asthma  Provider:  Chilton Greathouse MD  Procedure: Injection  Nucala (Mepolizumab), Dose: 100 mg, Site: subcutaneous, Number of injections: 1   Discharge: Condition: Good, Destination: Home . AVS Provided  Performed by:  Nat Math, RN

## 2023-04-07 ENCOUNTER — Ambulatory Visit: Payer: Self-pay

## 2023-04-07 DIAGNOSIS — J309 Allergic rhinitis, unspecified: Secondary | ICD-10-CM | POA: Diagnosis not present

## 2023-04-14 ENCOUNTER — Ambulatory Visit (INDEPENDENT_AMBULATORY_CARE_PROVIDER_SITE_OTHER): Payer: Medicare Other

## 2023-04-14 DIAGNOSIS — J309 Allergic rhinitis, unspecified: Secondary | ICD-10-CM | POA: Diagnosis not present

## 2023-04-19 ENCOUNTER — Ambulatory Visit (INDEPENDENT_AMBULATORY_CARE_PROVIDER_SITE_OTHER): Payer: Medicare Other

## 2023-04-19 DIAGNOSIS — I639 Cerebral infarction, unspecified: Secondary | ICD-10-CM

## 2023-04-19 LAB — CUP PACEART REMOTE DEVICE CHECK
Date Time Interrogation Session: 20240905230107
Implantable Pulse Generator Implant Date: 20210107

## 2023-04-23 ENCOUNTER — Ambulatory Visit (INDEPENDENT_AMBULATORY_CARE_PROVIDER_SITE_OTHER): Payer: Self-pay

## 2023-04-23 DIAGNOSIS — J309 Allergic rhinitis, unspecified: Secondary | ICD-10-CM | POA: Diagnosis not present

## 2023-04-28 ENCOUNTER — Ambulatory Visit (INDEPENDENT_AMBULATORY_CARE_PROVIDER_SITE_OTHER): Payer: Self-pay

## 2023-04-28 DIAGNOSIS — J309 Allergic rhinitis, unspecified: Secondary | ICD-10-CM

## 2023-04-29 ENCOUNTER — Ambulatory Visit (HOSPITAL_COMMUNITY)
Admission: RE | Admit: 2023-04-29 | Discharge: 2023-04-29 | Disposition: A | Discharge status: Home / Self Care | Payer: Medicare Other | Source: Ambulatory Visit | Attending: Acute Care | Admitting: Acute Care

## 2023-04-29 DIAGNOSIS — Z122 Encounter for screening for malignant neoplasm of respiratory organs: Secondary | ICD-10-CM | POA: Insufficient documentation

## 2023-04-29 DIAGNOSIS — Z87891 Personal history of nicotine dependence: Secondary | ICD-10-CM | POA: Insufficient documentation

## 2023-04-30 ENCOUNTER — Ambulatory Visit (INDEPENDENT_AMBULATORY_CARE_PROVIDER_SITE_OTHER): Payer: Medicare Other

## 2023-04-30 VITALS — BP 127/80 | HR 71 | Temp 98.2°F | Resp 16 | Ht 67.0 in | Wt 186.6 lb

## 2023-04-30 DIAGNOSIS — J4550 Severe persistent asthma, uncomplicated: Secondary | ICD-10-CM

## 2023-04-30 MED ORDER — MEPOLIZUMAB 100 MG ~~LOC~~ SOLR
100.0000 mg | Freq: Once | SUBCUTANEOUS | Status: AC
  Administered 2023-04-30: 100 mg via SUBCUTANEOUS
  Filled 2023-04-30: qty 1

## 2023-04-30 NOTE — Progress Notes (Signed)
Diagnosis: Asthma  Provider:  Chilton Greathouse MD  Procedure: Injection  Nucala (Mepolizumab), Dose: 100 mg, Site: subcutaneous, Number of injections: 1   Discharge: Condition: Good, Destination: Home . AVS Provided  Performed by:  Nat Math, RN

## 2023-05-06 NOTE — Progress Notes (Signed)
Carelink Summary Report / Loop Recorder 

## 2023-05-07 ENCOUNTER — Ambulatory Visit (INDEPENDENT_AMBULATORY_CARE_PROVIDER_SITE_OTHER): Payer: Self-pay

## 2023-05-07 DIAGNOSIS — J309 Allergic rhinitis, unspecified: Secondary | ICD-10-CM

## 2023-05-07 NOTE — Progress Notes (Signed)
EXP 05/09/24

## 2023-05-10 DIAGNOSIS — J301 Allergic rhinitis due to pollen: Secondary | ICD-10-CM | POA: Diagnosis not present

## 2023-05-11 DIAGNOSIS — J302 Other seasonal allergic rhinitis: Secondary | ICD-10-CM | POA: Diagnosis not present

## 2023-05-14 ENCOUNTER — Other Ambulatory Visit: Payer: Self-pay

## 2023-05-14 DIAGNOSIS — Z87891 Personal history of nicotine dependence: Secondary | ICD-10-CM

## 2023-05-14 DIAGNOSIS — Z122 Encounter for screening for malignant neoplasm of respiratory organs: Secondary | ICD-10-CM

## 2023-05-19 ENCOUNTER — Ambulatory Visit (INDEPENDENT_AMBULATORY_CARE_PROVIDER_SITE_OTHER): Payer: Self-pay

## 2023-05-19 DIAGNOSIS — J309 Allergic rhinitis, unspecified: Secondary | ICD-10-CM | POA: Diagnosis not present

## 2023-05-24 ENCOUNTER — Ambulatory Visit (INDEPENDENT_AMBULATORY_CARE_PROVIDER_SITE_OTHER): Payer: Medicare Other

## 2023-05-24 ENCOUNTER — Ambulatory Visit (INDEPENDENT_AMBULATORY_CARE_PROVIDER_SITE_OTHER): Payer: Medicare Other | Admitting: Internal Medicine

## 2023-05-24 ENCOUNTER — Encounter: Payer: Self-pay | Admitting: Internal Medicine

## 2023-05-24 VITALS — BP 130/82 | HR 82 | Temp 97.9°F | Resp 18 | Ht 66.34 in | Wt 186.0 lb

## 2023-05-24 DIAGNOSIS — J4550 Severe persistent asthma, uncomplicated: Secondary | ICD-10-CM | POA: Diagnosis not present

## 2023-05-24 DIAGNOSIS — I639 Cerebral infarction, unspecified: Secondary | ICD-10-CM

## 2023-05-24 DIAGNOSIS — J3089 Other allergic rhinitis: Secondary | ICD-10-CM | POA: Diagnosis not present

## 2023-05-24 DIAGNOSIS — J302 Other seasonal allergic rhinitis: Secondary | ICD-10-CM

## 2023-05-24 LAB — CUP PACEART REMOTE DEVICE CHECK
Date Time Interrogation Session: 20241013230300
Implantable Pulse Generator Implant Date: 20210107

## 2023-05-24 MED ORDER — FLUTICASONE PROPIONATE 50 MCG/ACT NA SUSP: 2.0000 | Freq: Every day | NASAL | 5 refills | Status: DC

## 2023-05-24 MED ORDER — AZELASTINE HCL 0.1 % NA SOLN: 2.0000 | Freq: Two times a day (BID) | NASAL | 5 refills | Status: DC | PRN

## 2023-05-24 MED ORDER — BUDESONIDE-FORMOTEROL FUMARATE 160-4.5 MCG/ACT IN AERO: 2.0000 | INHALATION_SPRAY | Freq: Two times a day (BID) | RESPIRATORY_TRACT | 5 refills | Status: AC

## 2023-05-24 MED ORDER — ALBUTEROL SULFATE HFA 108 (90 BASE) MCG/ACT IN AERS: 1.0000 | INHALATION_SPRAY | Freq: Four times a day (QID) | RESPIRATORY_TRACT | 1 refills | Status: DC | PRN

## 2023-05-24 MED ORDER — CETIRIZINE HCL 10 MG PO TABS: 10.0000 mg | ORAL_TABLET | Freq: Every day | ORAL | 5 refills | Status: DC | PRN

## 2023-05-24 NOTE — Patient Instructions (Addendum)
Severe Persistent Asthma-COPD - Continue follow up with Pulmonology.  - Daily controller medication(s): Symbicort two puffs twice daily with spacer + Singulair (montelukast) 10mg  daily + Nucala 100mg  subcutaneous monthly - Rescue medications:Albuterol 1-2 puffs every 4-6 hours as needed  - Asthma control goals:  * Full participation in all desired activities (may need albuterol before activity) * Albuterol use two time or less a week on average (not counting use with activity) * Cough interfering with sleep two time or less a month * Oral steroids no more than once a year * No hospitalizations  Allergic Rhinitis - Positive skin test 03/2022: molds ,dust mites, trees  - Avoidance measures discussed. - Use Flonase 1-2 sprays each nostril daily.  Aim upward and outward.  - Use Azelastine 1-2 sprays each nostril twice daily as needed for runny nose, drainage, sneezing, congestion. Aim upward and outward. - Use Zyrtec 10 mg daily as needed.  - Continue allergy shots on schedule. Keep Epipen.

## 2023-05-24 NOTE — Progress Notes (Signed)
FOLLOW UP Date of Service/Encounter:  05/24/23   Subjective:  Caleb Evans (DOB: 29-May-1951) is a 72 y.o. male who [facility] for follow up for allergic rhinitis  Also with asthma-COPD, followed by Pulm.   History obtained from: chart review and patient. Last visit was on 04/22/2022 with Dr Dellis Anes and at the time was doing well.  On Symbicort, nucala- followed by Pulm.  Retested on 03/2022 for allergic rhinitis. Started on new prescription of AIT 04/2022 with molds and DM and continued red vial of trees from prior.   Reports doing well with shots.  On new Rx of AIT since 04/2022.  No reactions. Has not been using anti histamines much due to concern regarding anti histamines and side effects.  Taking Flonase/Azelastine PRN.   He is due to see Pulm soon.  Reports asthma is doing well.  On Symbicort, forgot to take Singulair.  Rarely needs rescue inhaler.  No ER visits/oral prednisone in the last year.  Taking Nucala shots through them yearly.   Past Medical History: 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   Bladder mass    Bladder neoplasm    Cancer (HCC)    bladder cancer   COPD (chronic obstructive pulmonary disease) (HCC)    Crohn's disease (HCC)    CVA (cerebral vascular accident) (HCC) 05/29/2019   Diverticulitis    Former smoker 05/29/2019   GERD (gastroesophageal reflux disease)    takes Nexium off and on   Hyperlipidemia 11/22/2017   Over weight    Seasonal and perennial allergic rhinitis 04/19/2017   Skin lesion    Thoracic aortic aneurysm (HCC)    Weak urinary stream     Objective:  BP 130/82   Pulse 82   Temp 97.9 F (36.6 C)   Resp 18   Ht 5' 6.34" (1.685 m)   Wt 186 lb (84.4 kg)   SpO2 99%   BMI 29.72 kg/m  Body mass index is 29.72  kg/m. Physical Exam: GEN: alert, well developed HEENT: clear conjunctiva,  nose with mild inferior turbinate hypertrophy, pink nasal mucosa, clear rhinorrhea, no cobblestoning HEART: regular rate and rhythm, no murmur LUNGS: clear to auscultation bilaterally, no coughing, unlabored respiration SKIN: no rashes or lesions  Spirometry:  Tracings reviewed. His effort: It was hard to get consistent efforts and there is a question as to whether this reflects a maximal maneuver. FVC: 3.04L FEV1: 1.96L, 70% predicted FEV1/FVC ratio: 64% Interpretation: Spirometry consistent with mild obstructive disease.  Please see scanned spirometry results for details.  Assessment:   1. Seasonal and perennial allergic rhinitis   2. Severe persistent asthma, uncomplicated     Plan/Recommendations:  Severe Persistent Asthma-COPD - Continue follow up with Pulmonology. Spirometry today with obstruction but symptomatically doing well.  - Daily controller medication(s): Symbicort two puffs twice daily with spacer + restart Singulair (montelukast) 10mg  daily + Nucala 100mg  subcutaneous monthly - Rescue medications:Albuterol 1-2 puffs every 4-6 hours as needed  - Asthma control goals:  * Full participation in all desired activities (may need albuterol before activity) * Albuterol use two time or less a week on average (not counting use with activity) * Cough interfering with sleep two time or less a month * Oral steroids no more than once  a year * No hospitalizations  Allergic Rhinitis - Uncontrolled but new vials with mold was just started in 2023.  - Positive skin test 03/2022: molds ,dust mites, trees  - Avoidance measures discussed. - Use Flonase 1-2 sprays each nostril daily.  Aim upward and outward.  - Use Azelastine 1-2 sprays each nostril twice daily as needed for runny nose, drainage, sneezing, congestion. Aim upward and outward. - Use Zyrtec 10 mg daily as needed.  - Continue allergy shots on  schedule. Keep Epipen.       Return in about 1 year (around 05/23/2024).  Alesia Morin, MD [facility]

## 2023-05-28 ENCOUNTER — Ambulatory Visit: Payer: Medicare Other

## 2023-06-02 ENCOUNTER — Ambulatory Visit (INDEPENDENT_AMBULATORY_CARE_PROVIDER_SITE_OTHER): Payer: Medicare Other

## 2023-06-02 DIAGNOSIS — J309 Allergic rhinitis, unspecified: Secondary | ICD-10-CM

## 2023-06-04 ENCOUNTER — Ambulatory Visit (INDEPENDENT_AMBULATORY_CARE_PROVIDER_SITE_OTHER): Payer: Medicare Other

## 2023-06-04 VITALS — BP 149/89 | HR 72 | Temp 97.9°F | Resp 98 | Ht 67.0 in | Wt 189.8 lb

## 2023-06-04 DIAGNOSIS — J4550 Severe persistent asthma, uncomplicated: Secondary | ICD-10-CM | POA: Diagnosis not present

## 2023-06-04 MED ORDER — MEPOLIZUMAB 100 MG ~~LOC~~ SOLR
100.0000 mg | Freq: Once | SUBCUTANEOUS | Status: AC
  Administered 2023-06-04: 100 mg via SUBCUTANEOUS
  Filled 2023-06-04: qty 1

## 2023-06-04 NOTE — Progress Notes (Signed)
Diagnosis: Asthma  Provider:  Chilton Greathouse MD  Procedure: Injection  Nucala (Mepolizumab), Dose: 100 mg, Site: subcutaneous, Number of injections: 1  Post Care: Patient declined observation  Discharge: Condition: Good, Destination: Home . AVS Provided  Performed by:  Loney Hering, LPN

## 2023-06-09 ENCOUNTER — Ambulatory Visit (INDEPENDENT_AMBULATORY_CARE_PROVIDER_SITE_OTHER): Payer: Medicare Other

## 2023-06-09 DIAGNOSIS — J309 Allergic rhinitis, unspecified: Secondary | ICD-10-CM | POA: Diagnosis not present

## 2023-06-09 MED ORDER — EPINEPHRINE 0.3 MG/0.3ML IJ SOAJ
0.3000 mg | INTRAMUSCULAR | 1 refills | Status: DC | PRN
Start: 2023-06-09 — End: 2023-06-09

## 2023-06-09 MED ORDER — EPINEPHRINE 0.3 MG/0.3ML IJ SOAJ: 0.3000 mg | INTRAMUSCULAR | 1 refills | Status: DC | PRN

## 2023-06-09 NOTE — Addendum Note (Signed)
Addended by: Robet Leu A on: 06/09/2023 02:56 PM   Modules accepted: Orders

## 2023-06-09 NOTE — Progress Notes (Signed)
Carelink Summary Report / Loop Recorder 

## 2023-06-11 ENCOUNTER — Ambulatory Visit: Payer: Medicare Other | Attending: Internal Medicine | Admitting: Internal Medicine

## 2023-06-11 ENCOUNTER — Telehealth: Payer: Self-pay | Admitting: Family Medicine

## 2023-06-11 ENCOUNTER — Encounter: Payer: Self-pay | Admitting: Internal Medicine

## 2023-06-11 VITALS — BP 120/82 | HR 81 | Ht 67.0 in | Wt 185.4 lb

## 2023-06-11 DIAGNOSIS — I639 Cerebral infarction, unspecified: Secondary | ICD-10-CM | POA: Diagnosis present

## 2023-06-11 DIAGNOSIS — Z136 Encounter for screening for cardiovascular disorders: Secondary | ICD-10-CM | POA: Diagnosis not present

## 2023-06-11 MED ORDER — EPINEPHRINE 0.3 MG/0.3ML IJ SOAJ: 0.3000 mg | INTRAMUSCULAR | 1 refills | Status: DC | PRN

## 2023-06-11 NOTE — Telephone Encounter (Signed)
Called and spoke to patient and he expressed that his Auvi-Q would be $157.00 and he rather not pay that much. I expressed that I would send in a regular Epipen to his pharmacy. Patient expressed that he would contact his PCP as well to see if they can get his Auvi-Q sent in for a cheaper price. Patient expressed that he would call our office back with an update.

## 2023-06-11 NOTE — Telephone Encounter (Signed)
Patient came in today asking if he could get his Epi pen cheaper at [facility].

## 2023-06-11 NOTE — Patient Instructions (Signed)
Medication Instructions:  Your physician recommends that you continue on your current medications as directed. Please refer to the Current Medication list given to you today.  *If you need a refill on your cardiac medications before your next appointment, please call your pharmacy*   Lab Work: NONE   If you have labs (blood work) drawn today and your tests are completely normal, you will receive your results only by: MyChart Message (if you have MyChart) OR A paper copy in the mail If you have any lab test that is abnormal or we need to change your treatment, we will call you to review the results.   Testing/Procedures: NONE    Follow-Up: At [facility], you and your health needs are our priority.  As part of our continuing mission to provide you with exceptional heart care, we have created designated Provider Care Teams.  These Care Teams include your primary Cardiologist (physician) and Advanced Practice Providers (APPs -  Physician Assistants and Nurse Practitioners) who all work together to provide you with the care you need, when you need it.  We recommend signing up for the patient portal called "MyChart".  Sign up information is provided on this After Visit Summary.  MyChart is used to connect with patients for Virtual Visits (Telemedicine).  Patients are able to view lab/test results, encounter notes, upcoming appointments, etc.  Non-urgent messages can be sent to your provider as well.   To learn more about what you can do with MyChart, go to [email or web address].    Your next appointment:    AS Needed   Provider:   Lewayne Bunting, MD    Other Instructions Thank you for choosing [facility]!

## 2023-06-11 NOTE — Progress Notes (Signed)
HPI [name] returns today for followup s/p ILR insertion for cryptogenic stroke. He is a pleasant 72 yo man with a cryptogenic stroke, who underwent  ILR insertion almost 4 years ago. The patient has done well in the interim.  No neuro changes. No palpitations  Allergies  Allergen Reactions   Chlorpheniramine Other (See Comments)    Sinus congestion   Amoxicillin-Pot Clavulanate Other (See Comments)    UNSPECIFIED REACTION  Has patient had a PCN reaction causing immediate rash, facial/tongue/throat swelling, SOB or lightheadedness with hypotension: No Has patient had a PCN reaction causing severe rash involving mucus membranes or skin necrosis: No Has patient had a PCN reaction that required hospitalization: No Has patient had a PCN reaction occurring within the last 10 years: Yes If all of the above answers are "NO", then may proceed with Cephalosporin use.  Caused liver failure    Lisinopril Cough     Current Outpatient Medications  Medication Sig Dispense Refill   albuterol (VENTOLIN HFA) 108 (90 Base) MCG/ACT inhaler Inhale 1-2 puffs into the lungs every 6 (six) hours as needed for wheezing or shortness of breath. 8 g 1   aspirin 81 MG chewable tablet Chew 1 tablet (81 mg total) by mouth daily.     atorvastatin (LIPITOR) 80 MG tablet Take 80 mg by mouth at bedtime.     azelastine (ASTELIN) 0.1 % nasal spray Place 2 sprays into both nostrils 2 (two) times daily as needed for rhinitis or allergies. 30 mL 5   benzonatate (TESSALON) 100 MG capsule TAKE 2 CAPSULES THREE TIMES DAILY AS NEEDED. 90 capsule 0   budesonide-formoterol (SYMBICORT) 160-4.5 MCG/ACT inhaler Inhale 2 puffs into the lungs 2 (two) times daily. 10.2 g 5   Calcium Carb-Cholecalciferol (CALCIUM 600+D3 PO) Take 1 tablet by mouth daily.     cetirizine (ZYRTEC ALLERGY) 10 MG tablet Take 1 tablet (10 mg total) by mouth daily as needed for allergies. 30 tablet 5   EPINEPHrine (AUVI-Q) 0.3 mg/0.3 mL IJ SOAJ  injection Inject 0.3 mg into the muscle as needed for anaphylaxis. As directed for life-threatening allergic reactions 2 each 1   fluticasone (FLONASE) 50 MCG/ACT nasal spray Place 2 sprays into both nostrils daily. 16 g 5   hydrochlorothiazide (HYDRODIURIL) 12.5 MG tablet Take 12.5 mg by mouth daily.     ipratropium-albuterol (DUONEB) 0.5-2.5 (3) MG/3ML SOLN Take 3 mLs by nebulization every 4 (four) hours as needed. 360 mL 2   losartan (COZAAR) 25 MG tablet Take 25 mg by mouth daily.     Mepolizumab (NUCALA) 100 MG/ML SOAJ Inject into the skin.     Multiple Vitamin (MULTIVITAMIN WITH MINERALS) TABS tablet Take 1 tablet by mouth daily.     olopatadine (PATANOL) 0.1 % ophthalmic solution PLACE 1 DROP IN BOTH EYES 2 TIMES DAILY. 5 mL 0   Respiratory Therapy Supplies (NEBULIZER MASK ADULT) MISC 1 each by Does not apply route as needed. 1 each 0   VIAGRA 100 MG tablet SMARTSIG:1 Tablet(s) By Mouth     No current facility-administered medications for this visit.   Facility-Administered Medications Ordered in Other Visits  Medication Dose Route Frequency Provider Last Rate Last Admin   clindamycin (CLEOCIN) 900 mg in dextrose 5 % 50 mL IVPB  900 mg Intravenous 60 min Pre-Op Almond Lint, MD       And   gentamicin (GARAMYCIN) 390 mg in dextrose 5 % 50 mL IVPB  5 mg/kg Intravenous 60 min Pre-Op  Almond Lint, MD       mepolizumab (NUCALA) injection 100 mg  100 mg Subcutaneous Once Josephine Igo, DO         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   Past Surgical History:  Procedure Laterality Date   APPENDECTOMY     CHOLECYSTECTOMY     LAPAROSCOPIC PARTIAL COLECTOMY  01/18/2018   ERAS PATHWAY   LAPAROSCOPIC PARTIAL COLECTOMY N/A 01/18/2018   Procedure: LAPAROSCOPIC PARTIAL COLECTOMY ERAS PATHWAY;  Surgeon: Almond Lint, MD;  Location: [facility] OR;  Service: General;  Laterality: N/A;   TONSILLECTOMY       Family History  Adopted: Yes  Problem Relation Age of Onset   Healthy Son    Healthy Daughter    Bladder Cancer Neg Hx    Kidney cancer Neg Hx    Prostate cancer Neg Hx    Allergic rhinitis Neg Hx    Angioedema Neg Hx    Asthma Neg Hx    Eczema Neg Hx    Urticaria Neg Hx    Immunodeficiency Neg Hx    Atopy Neg Hx      Social History   Socioeconomic History   Marital status: Married    Spouse name: Not on file   Number of children: 2   Years of education: Not on file   Highest education level: Associate degree: occupational, Scientist, product/process development, or vocational program  Occupational History   Not on file  Tobacco Use   Smoking status: Former    Current packs/day: 0.00    Average packs/day: 3.0 packs/day for 50.0 years (150.0 ttl pk-yrs)    Types: Cigarettes    Start date: 04/10/1968    Quit date: 04/10/2018    Years since quitting: 5.1   Smokeless tobacco: Never  Vaping Use   Vaping status: Never Used  Substance and Sexual Activity   Alcohol use: Yes    Alcohol/week: 0.0 standard drinks of alcohol    Comment: occasional   Drug use: No   Sexual activity: Not on file  Other Topics Concern   Not on file  Social History Narrative   Patient is adopted   Married, lives with spouse   2 children (boy and girl)   OCCUPATION: Curator x54yrs.   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   Right handed   Pt drinks 2 cups of coffee a day, rarely drinks tea, he drinks soda 3-4 a day   Social Determinants of Corporate investment banker Strain: Not on file  Food Insecurity: Not on file  Transportation  Needs: Not on file  Physical Activity: Not on file  Stress: Not on file  Social Connections: Not on file  Intimate Partner Violence: Not on file     BP 120/82 (BP Location: Right Arm, Patient Position: Sitting, Cuff Size: Normal)   Pulse 81   Ht 5\' 7"  (1.702 m)   Wt 185 lb 6.4 oz (84.1 kg)   SpO2 95%   BMI 29.04 kg/m   Physical Exam:  Well appearing NAD HEENT: Unremarkable Neck:  No JVD, no thyromegally Lymphatics:  No adenopathy Back:  No CVA tenderness Lungs:  Clear HEART:  Regular rate rhythm, no murmurs, no rubs, no clicks Abd:  soft, positive bowel sounds, no organomegally, no rebound, no guarding Ext:  2 plus pulses, no edema, no cyanosis, no clubbing Skin:  No rashes no nodules Neuro:  CN II through XII intact, motor grossly intact  EKG nsr DEVICE  Normal device function.  See PaceArt for details.  No afib on ILR  Assess/Plan: Cryptogenic stroke - no obvious etiology. He will continue his current med sand will undergo watchful waiting.   Sharlot Gowda Delicia Berens,MD

## 2023-06-16 ENCOUNTER — Ambulatory Visit (INDEPENDENT_AMBULATORY_CARE_PROVIDER_SITE_OTHER): Payer: Self-pay

## 2023-06-16 DIAGNOSIS — J309 Allergic rhinitis, unspecified: Secondary | ICD-10-CM | POA: Diagnosis not present

## 2023-06-28 ENCOUNTER — Ambulatory Visit (INDEPENDENT_AMBULATORY_CARE_PROVIDER_SITE_OTHER): Payer: Medicare Other

## 2023-06-28 DIAGNOSIS — I639 Cerebral infarction, unspecified: Secondary | ICD-10-CM | POA: Diagnosis not present

## 2023-06-28 LAB — CUP PACEART REMOTE DEVICE CHECK
Date Time Interrogation Session: 20241117230223
Implantable Pulse Generator Implant Date: 20210107

## 2023-06-30 ENCOUNTER — Ambulatory Visit (INDEPENDENT_AMBULATORY_CARE_PROVIDER_SITE_OTHER): Payer: Self-pay

## 2023-06-30 DIAGNOSIS — J309 Allergic rhinitis, unspecified: Secondary | ICD-10-CM

## 2023-07-02 ENCOUNTER — Ambulatory Visit (INDEPENDENT_AMBULATORY_CARE_PROVIDER_SITE_OTHER): Payer: Medicare Other

## 2023-07-02 VITALS — BP 152/88 | HR 66 | Temp 97.8°F | Resp 16 | Ht 67.0 in | Wt 189.6 lb

## 2023-07-02 DIAGNOSIS — J4550 Severe persistent asthma, uncomplicated: Secondary | ICD-10-CM | POA: Diagnosis not present

## 2023-07-02 MED ORDER — MEPOLIZUMAB 100 MG ~~LOC~~ SOLR
100.0000 mg | Freq: Once | SUBCUTANEOUS | Status: AC
Start: 2023-07-02 — End: 2023-07-02
  Administered 2023-07-02: 100 mg via SUBCUTANEOUS
  Filled 2023-07-02: qty 1

## 2023-07-02 NOTE — Progress Notes (Signed)
Diagnosis: Asthma  Provider:  Chilton Greathouse MD  Procedure: Injection  Nucala, Dose: 100 mg, Site: subcutaneous, Number of injections: 1  Post Care:  left arm injection  Discharge: Condition: Good, Destination: Home . AVS Declined  Performed by:  Rico Ala, LPN

## 2023-07-07 ENCOUNTER — Ambulatory Visit (INDEPENDENT_AMBULATORY_CARE_PROVIDER_SITE_OTHER): Payer: Medicare Other | Admitting: *Deleted

## 2023-07-07 DIAGNOSIS — J309 Allergic rhinitis, unspecified: Secondary | ICD-10-CM

## 2023-07-14 DIAGNOSIS — J3089 Other allergic rhinitis: Secondary | ICD-10-CM | POA: Diagnosis not present

## 2023-07-14 NOTE — Progress Notes (Signed)
VIAL EXP 09-03-23 

## 2023-07-21 ENCOUNTER — Ambulatory Visit (INDEPENDENT_AMBULATORY_CARE_PROVIDER_SITE_OTHER): Payer: Self-pay

## 2023-07-21 DIAGNOSIS — J309 Allergic rhinitis, unspecified: Secondary | ICD-10-CM

## 2023-07-22 ENCOUNTER — Ambulatory Visit (INDEPENDENT_AMBULATORY_CARE_PROVIDER_SITE_OTHER): Payer: Medicare Other | Admitting: Pulmonary Disease

## 2023-07-22 VITALS — BP 158/90 | HR 76 | Ht 67.0 in | Wt 188.2 lb

## 2023-07-22 DIAGNOSIS — J4489 Other specified chronic obstructive pulmonary disease: Secondary | ICD-10-CM | POA: Diagnosis not present

## 2023-07-22 DIAGNOSIS — J3089 Other allergic rhinitis: Secondary | ICD-10-CM

## 2023-07-22 DIAGNOSIS — Z87891 Personal history of nicotine dependence: Secondary | ICD-10-CM | POA: Diagnosis not present

## 2023-07-22 DIAGNOSIS — J4550 Severe persistent asthma, uncomplicated: Secondary | ICD-10-CM

## 2023-07-22 DIAGNOSIS — J302 Other seasonal allergic rhinitis: Secondary | ICD-10-CM

## 2023-07-22 MED ORDER — BENZONATATE 100 MG PO CAPS: 200.0000 mg | ORAL_CAPSULE | Freq: Three times a day (TID) | ORAL | 3 refills | Status: AC | PRN

## 2023-07-22 NOTE — Patient Instructions (Signed)
Thank you for visiting Dr. Tonia Brooms at [facility]. Today we recommend the following:  Meds ordered this encounter  Medications   benzonatate (TESSALON) 100 MG capsule    Sig: Take 2 capsules (200 mg total) by mouth 3 (three) times daily as needed for cough.    Dispense:  180 capsule    Refill:  3   Return in about 1 year (around 07/21/2024) for with APP or Dr. Tonia Brooms.    Please do your part to reduce the spread of COVID-19.

## 2023-07-22 NOTE — Progress Notes (Signed)
Synopsis: Referred in Feb 2022 for severe asthma by Preston Fleeting*  Subjective:   PATIENT ID: Caleb Evans GENDER: male DOB: 01-11-1951, MRN: [id number]  Chief Complaint  Patient presents with   Follow-up    F/U     72 yo M, AAA, crohns, COPD, was at [employer], Retired [employer], retired early after 23 years. Mostly stable with his breathing now. He has been on Nucala injections for ~3 years. He does think it helps him. Currently using albuterol usually once per day. Rarely using nebulizer. Currently compliant with symbicort twice daily. Quit smoking 1 year ago. He still craves them on occasion. Recently on prednisone for an exacerbation while traveling in [place], [place] while on vacation.  At this point remained stable on his current regimen here today to establish care with new primary pulmonary provider.  OV 10/24/2021: Here today for follow-up for severe asthma on biologic therapy.  Currently managed with Nucala.  Patient doing well with no significant complaints.  Currently using Symbicort, albuterol as needed.  Not had any exacerbations requiring steroids this past year.  Needs refills of medications.  OV 07/22/2023: Here today for follow-up for severe asthma.  On biologic therapy currently managed with Nucala.  In addition to his biologic injections using Symbicort and as needed albuterol.  Needs medication refills today.  Patient needs refills of his Tessalon Perles.  He takes them as needed for cough.  But otherwise he has had no exacerbations received no steroids this past year overall has been doing well coming in with getting his Nucala injections.  We talked about home injections but he would prefer to have them here in the office.      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    Bladder mass    Bladder neoplasm    Cancer (HCC)    bladder cancer   COPD (chronic obstructive pulmonary disease) (HCC)    Crohn's disease (HCC)    CVA (cerebral vascular accident) (HCC) 05/29/2019   Diverticulitis    Former smoker 05/29/2019   GERD (gastroesophageal reflux disease)    takes Nexium off and on   Hyperlipidemia 11/22/2017   Over weight    Seasonal and perennial allergic rhinitis 04/19/2017   Skin lesion    Thoracic aortic aneurysm (HCC)    Weak urinary stream      Family History  Adopted: Yes  Problem Relation Age of Onset   Healthy Son    Healthy Daughter    Bladder Cancer Neg Hx    Kidney cancer Neg Hx    Prostate cancer Neg Hx    Allergic rhinitis Neg Hx    Angioedema Neg Hx    Asthma Neg Hx    Eczema Neg Hx    Urticaria Neg Hx    Immunodeficiency Neg Hx    Atopy Neg Hx      Past Surgical History:  Procedure Laterality Date   APPENDECTOMY     CHOLECYSTECTOMY     LAPAROSCOPIC PARTIAL COLECTOMY  01/18/2018   ERAS PATHWAY   LAPAROSCOPIC PARTIAL COLECTOMY N/A 01/18/2018   Procedure: LAPAROSCOPIC PARTIAL COLECTOMY ERAS PATHWAY;  Surgeon: Almond Lint, MD;  Location: [facility] OR;  Service: General;  Laterality: N/A;   TONSILLECTOMY      Social History   Socioeconomic  History   Marital status: Married    Spouse name: Not on file   Number of children: 2   Years of education: Not on file   Highest education level: Associate degree: occupational, Scientist, product/process development, or vocational program  Occupational History   Not on file  Tobacco Use   Smoking status: Former    Current packs/day: 0.00    Average packs/day: 3.0 packs/day for 50.0 years (150.0 ttl pk-yrs)    Types: Cigarettes    Start date: 04/10/1968    Quit date: 04/10/2018    Years since quitting: 5.2   Smokeless tobacco: Never  Vaping Use   Vaping status: Never Used  Substance and Sexual Activity   Alcohol use: Yes    Alcohol/week: 0.0 standard drinks of alcohol    Comment: occasional   Drug use: No    Sexual activity: Not on file  Other Topics Concern   Not on file  Social History Narrative   Patient is adopted   Married, lives with spouse   2 children (boy and girl)   OCCUPATION: Curator x24yrs.  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   Right handed   Pt drinks 2 cups of coffee a day, rarely drinks tea, he drinks soda 3-4 a day   Social Drivers of Corporate investment banker Strain: Not on file  Food Insecurity: Not on file  Transportation Needs: Not on file  Physical Activity: Not on file  Stress: Not on file  Social Connections: Not on file  Intimate Partner Violence: Not on file     Allergies  Allergen Reactions   Chlorpheniramine Other (See Comments)    Sinus congestion   Amoxicillin-Pot Clavulanate Other (See Comments)    UNSPECIFIED REACTION  Has patient had a PCN reaction causing immediate rash, facial/tongue/throat swelling, SOB or lightheadedness with hypotension: No Has patient had a PCN reaction causing severe rash involving mucus membranes or skin necrosis: No Has patient had a PCN reaction that required hospitalization: No Has patient had a PCN reaction occurring within the last 10 years: Yes If all of the above answers are "NO", then may proceed with Cephalosporin use.  Caused liver failure    Lisinopril Cough     Outpatient Medications Prior to Visit  Medication Sig Dispense Refill   albuterol (VENTOLIN HFA) 108 (90 Base) MCG/ACT inhaler Inhale 1-2 puffs into the lungs every 6 (six) hours as needed for wheezing or shortness of breath. 8 g 1   aspirin 81 MG chewable tablet Chew 1 tablet (81 mg total) by mouth daily.     atorvastatin (LIPITOR) 80 MG tablet Take 80 mg by mouth at bedtime.     azelastine (ASTELIN) 0.1 % nasal spray Place 2 sprays into both nostrils 2 (two) times daily as needed for rhinitis or allergies. 30 mL 5   budesonide-formoterol (SYMBICORT) 160-4.5 MCG/ACT inhaler Inhale 2 puffs into the lungs 2  (two) times daily. 10.2 g 5   Calcium Carb-Cholecalciferol (CALCIUM 600+D3 PO) Take 1 tablet by mouth daily.     cetirizine (ZYRTEC ALLERGY) 10 MG tablet Take 1 tablet (10 mg total) by mouth daily as needed for allergies. 30 tablet 5   EPINEPHrine (AUVI-Q) 0.3 mg/0.3 mL IJ SOAJ injection Inject 0.3 mg into the muscle as needed for anaphylaxis. As directed for life-threatening allergic reactions 2 each 1   EPINEPHrine (EPIPEN 2-PAK) 0.3 mg/0.3 mL IJ SOAJ injection Inject 0.3 mg into the muscle  as needed for anaphylaxis. 2 each 1   fluticasone (FLONASE) 50 MCG/ACT nasal spray Place 2 sprays into both nostrils daily. 16 g 5   hydrochlorothiazide (HYDRODIURIL) 12.5 MG tablet Take 12.5 mg by mouth daily.     ipratropium-albuterol (DUONEB) 0.5-2.5 (3) MG/3ML SOLN Take 3 mLs by nebulization every 4 (four) hours as needed. 360 mL 2   losartan (COZAAR) 25 MG tablet Take 25 mg by mouth daily.     Mepolizumab (NUCALA) 100 MG/ML SOAJ Inject into the skin.     Multiple Vitamin (MULTIVITAMIN WITH MINERALS) TABS tablet Take 1 tablet by mouth daily.     olopatadine (PATANOL) 0.1 % ophthalmic solution PLACE 1 DROP IN BOTH EYES 2 TIMES DAILY. 5 mL 0   Respiratory Therapy Supplies (NEBULIZER MASK ADULT) MISC 1 each by Does not apply route as needed. 1 each 0   VIAGRA 100 MG tablet SMARTSIG:1 Tablet(s) By Mouth     benzonatate (TESSALON) 100 MG capsule TAKE 2 CAPSULES THREE TIMES DAILY AS NEEDED. 90 capsule 0   Facility-Administered Medications Prior to Visit  Medication Dose Route Frequency Provider Last Rate Last Admin   clindamycin (CLEOCIN) 900 mg in dextrose 5 % 50 mL IVPB  900 mg Intravenous 60 min Pre-Op Almond Lint, MD       And   gentamicin (GARAMYCIN) 390 mg in dextrose 5 % 50 mL IVPB  5 mg/kg Intravenous 60 min Pre-Op Almond Lint, MD       mepolizumab (NUCALA) injection 100 mg  100 mg Subcutaneous Once Chrishawna Farina L, DO        Review of Systems  Constitutional:  Negative for chills, fever,  malaise/fatigue and weight loss.  HENT:  Negative for hearing loss, sore throat and tinnitus.   Eyes:  Negative for blurred vision and double vision.  Respiratory:  Negative for cough, hemoptysis, sputum production, shortness of breath, wheezing and stridor.   Cardiovascular:  Negative for chest pain, palpitations, orthopnea, leg swelling and PND.  Gastrointestinal:  Negative for abdominal pain, constipation, diarrhea, heartburn, nausea and vomiting.  Genitourinary:  Negative for dysuria, hematuria and urgency.  Musculoskeletal:  Negative for joint pain and myalgias.  Skin:  Negative for itching and rash.  Neurological:  Negative for dizziness, tingling, weakness and headaches.  Endo/Heme/Allergies:  Negative for environmental allergies. Does not bruise/bleed easily.  Psychiatric/Behavioral:  Negative for depression. The patient is not nervous/anxious and does not have insomnia.   All other systems reviewed and are negative.    Objective:  Physical Exam Vitals reviewed.  Constitutional:      General: He is not in acute distress.    Appearance: He is well-developed.  HENT:     Head: Normocephalic and atraumatic.  Eyes:     General: No scleral icterus.    Conjunctiva/sclera: Conjunctivae normal.     Pupils: Pupils are equal, round, and reactive to light.  Neck:     Vascular: No JVD.     Trachea: No tracheal deviation.  Cardiovascular:     Rate and Rhythm: Normal rate and regular rhythm.     Heart sounds: Normal heart sounds. No murmur heard. Pulmonary:     Effort: Pulmonary effort is normal. No tachypnea, accessory muscle usage or respiratory distress.     Breath sounds: No stridor. No wheezing, rhonchi or rales.  Abdominal:     General: There is no distension.     Palpations: Abdomen is soft.     Tenderness: There is no abdominal tenderness.  Musculoskeletal:  General: No tenderness.     Cervical back: Neck supple.  Lymphadenopathy:     Cervical: No cervical  adenopathy.  Skin:    General: Skin is warm and dry.     Capillary Refill: Capillary refill takes less than 2 seconds.     Findings: No rash.  Neurological:     Mental Status: He is alert and oriented to person, place, and time.  Psychiatric:        Behavior: Behavior normal.      Vitals:   07/22/23 1600  BP: (!) 158/90  Pulse: 76  SpO2: 97%  Weight: 188 lb 3.2 oz (85.4 kg)  Height: 5\' 7"  (1.702 m)    97% on RA BMI Readings from Last 3 Encounters:  07/22/23 29.48 kg/m  07/02/23 29.70 kg/m  06/11/23 29.04 kg/m   Wt Readings from Last 3 Encounters:  07/22/23 188 lb 3.2 oz (85.4 kg)  07/02/23 189 lb 9.6 oz (86 kg)  06/11/23 185 lb 6.4 oz (84.1 kg)     CBC    Component Value Date/Time   WBC 9.5 06/13/2019 1652   RBC 4.72 06/13/2019 1652   HGB 14.6 06/13/2019 1652   HCT 44.0 06/13/2019 1652   PLT 388.0 06/13/2019 1652   MCV 93.2 06/13/2019 1652   MCH 30.8 05/29/2019 0456   MCHC 33.2 06/13/2019 1652   RDW 14.4 06/13/2019 1652   LYMPHSABS 2.9 06/13/2019 1652   MONOABS 0.6 06/13/2019 1652   EOSABS 0.1 06/13/2019 1652   BASOSABS 0.1 06/13/2019 1652    Chest Imaging: Lung cancer screening CT 01/18/2020: Lung RADS 2 centrilobular emphysema, paraseptal emphysema recommending 75-month follow-up. The patient's images have been independently reviewed by me.    Pulmonary Functions Testing Results:    Latest Ref Rng & Units 04/27/2018    3:15 PM  PFT Results  FVC-Pre L 3.32   FVC-Predicted Pre % 81   FVC-Post L 3.47   FVC-Predicted Post % 84   Pre FEV1/FVC % % 70   Post FEV1/FCV % % 72   FEV1-Pre L 2.32   FEV1-Predicted Pre % 75   FEV1-Post L 2.50   DLCO uncorrected ml/min/mmHg 20.09   DLCO UNC% % 70   DLVA Predicted % 84   TLC L 5.80   TLC % Predicted % 90   RV % Predicted % 107     FeNO:   Pathology:   Echocardiogram:   Heart Catheterization:     Assessment & Plan:      ICD-10-CM   1. Severe persistent asthma, uncomplicated  J45.50      2. Asthma-COPD overlap syndrome (HCC)  J44.89     3. Former smoker  Z87.891     4. Seasonal and perennial allergic rhinitis  J30.89    J30.2       Discussion:  This is a 72 year old gentleman severe persistent asthma, former smoker has asthma COPD overlap syndrome.  He had several exacerbations ultimately requiring escalation to biologic therapy.  He is currently on Nucala for the past 5+ years.  He is managed with Symbicort and as needed albuterol.  He rarely uses his albuterol has had no exacerbations this past year.  He uses as needed Lawyer for cough suppressant.  Plan: Continue Symbicort Continue refills of albuterol as needed Continue refills of Nucala for his maintenance injections. Refills of Tessalon Perles. Continue Flonase as needed during his peak season of allergy symptoms.  Return to clinic in 1 year or as needed based on  symptoms.    Current Outpatient Medications:    albuterol (VENTOLIN HFA) 108 (90 Base) MCG/ACT inhaler, Inhale 1-2 puffs into the lungs every 6 (six) hours as needed for wheezing or shortness of breath., Disp: 8 g, Rfl: 1   aspirin 81 MG chewable tablet, Chew 1 tablet (81 mg total) by mouth daily., Disp:  , Rfl:    atorvastatin (LIPITOR) 80 MG tablet, Take 80 mg by mouth at bedtime., Disp: , Rfl:    azelastine (ASTELIN) 0.1 % nasal spray, Place 2 sprays into both nostrils 2 (two) times daily as needed for rhinitis or allergies., Disp: 30 mL, Rfl: 5   benzonatate (TESSALON) 100 MG capsule, Take 2 capsules (200 mg total) by mouth 3 (three) times daily as needed for cough., Disp: 180 capsule, Rfl: 3   budesonide-formoterol (SYMBICORT) 160-4.5 MCG/ACT inhaler, Inhale 2 puffs into the lungs 2 (two) times daily., Disp: 10.2 g, Rfl: 5   Calcium Carb-Cholecalciferol (CALCIUM 600+D3 PO), Take 1 tablet by mouth daily., Disp: , Rfl:    cetirizine (ZYRTEC ALLERGY) 10 MG tablet, Take 1 tablet (10 mg total) by mouth daily as needed for allergies., Disp: 30  tablet, Rfl: 5   EPINEPHrine (AUVI-Q) 0.3 mg/0.3 mL IJ SOAJ injection, Inject 0.3 mg into the muscle as needed for anaphylaxis. As directed for life-threatening allergic reactions, Disp: 2 each, Rfl: 1   EPINEPHrine (EPIPEN 2-PAK) 0.3 mg/0.3 mL IJ SOAJ injection, Inject 0.3 mg into the muscle as needed for anaphylaxis., Disp: 2 each, Rfl: 1   fluticasone (FLONASE) 50 MCG/ACT nasal spray, Place 2 sprays into both nostrils daily., Disp: 16 g, Rfl: 5   hydrochlorothiazide (HYDRODIURIL) 12.5 MG tablet, Take 12.5 mg by mouth daily., Disp: , Rfl:    ipratropium-albuterol (DUONEB) 0.5-2.5 (3) MG/3ML SOLN, Take 3 mLs by nebulization every 4 (four) hours as needed., Disp: 360 mL, Rfl: 2   losartan (COZAAR) 25 MG tablet, Take 25 mg by mouth daily., Disp: , Rfl:    Mepolizumab (NUCALA) 100 MG/ML SOAJ, Inject into the skin., Disp: , Rfl:    Multiple Vitamin (MULTIVITAMIN WITH MINERALS) TABS tablet, Take 1 tablet by mouth daily., Disp: , Rfl:    olopatadine (PATANOL) 0.1 % ophthalmic solution, PLACE 1 DROP IN BOTH EYES 2 TIMES DAILY., Disp: 5 mL, Rfl: 0   Respiratory Therapy Supplies (NEBULIZER MASK ADULT) MISC, 1 each by Does not apply route as needed., Disp: 1 each, Rfl: 0   VIAGRA 100 MG tablet, SMARTSIG:1 Tablet(s) By Mouth, Disp: , Rfl:  No current facility-administered medications for this visit.  Facility-Administered Medications Ordered in Other Visits:    clindamycin (CLEOCIN) 900 mg in dextrose 5 % 50 mL IVPB, 900 mg, Intravenous, 60 min Pre-Op **AND** gentamicin (GARAMYCIN) 390 mg in dextrose 5 % 50 mL IVPB, 5 mg/kg, Intravenous, 60 min Pre-Op, Almond Lint, MD   mepolizumab (NUCALA) injection 100 mg, 100 mg, Subcutaneous, Once, Caleb Evans, Caleb Bo, DO    Josephine Igo, DO [facility] 07/22/2023 4:08 PM

## 2023-07-23 NOTE — Progress Notes (Signed)
Carelink Summary Report / Loop Recorder 

## 2023-07-28 ENCOUNTER — Telehealth: Payer: Self-pay | Admitting: Allergy & Immunology

## 2023-07-28 NOTE — Telephone Encounter (Signed)
Patient came in with shortness of breath and allergic rhinitis symptoms for his allergy shot.  We denied the allergy shot, but recommended that he use Symbicort 2 puffs 4 times a day through the weekend.  We also recommended that he double up in his antihistamine.  He had been spending a lot of time outdoors doing some trimming of his trees and thinks that the outdoor allergens got to him.  Malachi Bonds, MD [facility]

## 2023-07-29 ENCOUNTER — Telehealth: Payer: Self-pay | Admitting: Pulmonary Disease

## 2023-07-29 MED ORDER — PREDNISONE 20 MG PO TABS: ORAL_TABLET | ORAL | 0 refills | Status: AC

## 2023-07-29 MED ORDER — PREDNISONE 20 MG PO TABS: ORAL_TABLET | ORAL | 0 refills | Status: DC

## 2023-07-29 MED ORDER — AZITHROMYCIN 250 MG PO TABS: ORAL_TABLET | ORAL | 0 refills | Status: DC

## 2023-07-29 MED ORDER — AZITHROMYCIN 250 MG PO TABS: ORAL_TABLET | ORAL | 0 refills | Status: AC

## 2023-07-29 NOTE — Telephone Encounter (Signed)
Asthma/COPD overlap with worsening SOB, wheeze, cough. After cutting tree suspect more likely asthma exacerbation. Prescribed prednisone taper and out of abundance of caution a zpak. Continue all other medications.

## 2023-07-29 NOTE — Telephone Encounter (Signed)
Patient states having symptoms of shortness of breath and cough. No available appointments at this time. Would like Prednisone called into pharmacy. Pharmacy is [facility]. Patient phone number is [phone].

## 2023-07-29 NOTE — Telephone Encounter (Signed)
I called and spoke with pt and notified of response per Dr. Judeth Horn.   Pt verbalized understanding  Nothing further needed

## 2023-07-29 NOTE — Telephone Encounter (Signed)
Called and spoke to patient.  He stated that he was outside cutting a tree 2 days ago. Last night he developed increased SOB, wheezing, runny nose and dry cough. It's difficult to take a deep breath.  Denied f/c/s or additional sx. He is taking tessalon BID, symbicort BID, albuterol TID and zyrtec with some improvement in sx.  No recent covid test.   Dr. Judeth Horn, please advise. Dr. Tonia Brooms is unavailable.

## 2023-07-30 ENCOUNTER — Ambulatory Visit (INDEPENDENT_AMBULATORY_CARE_PROVIDER_SITE_OTHER): Payer: Medicare Other

## 2023-07-30 VITALS — BP 134/92 | HR 75 | Temp 97.7°F | Resp 14 | Ht 67.0 in | Wt 187.0 lb

## 2023-07-30 DIAGNOSIS — J4550 Severe persistent asthma, uncomplicated: Secondary | ICD-10-CM

## 2023-07-30 MED ORDER — MEPOLIZUMAB 100 MG ~~LOC~~ SOLR
100.0000 mg | Freq: Once | SUBCUTANEOUS | Status: AC
  Administered 2023-07-30: 100 mg via SUBCUTANEOUS
  Filled 2023-07-30: qty 1

## 2023-07-30 NOTE — Progress Notes (Signed)
Diagnosis: Asthma  Provider:  Chilton Greathouse MD  Procedure: Injection  Nucala (Mepolizumab), Dose: 100 mg, Site: subcutaneous, Number of injections: 1  Injection Site(s): Right arm  Post Care: Patient declined observation  Discharge: Condition: Good, Destination: Home . AVS Provided  Performed by:  Adriana Mccallum, RN

## 2023-08-02 ENCOUNTER — Ambulatory Visit (INDEPENDENT_AMBULATORY_CARE_PROVIDER_SITE_OTHER): Payer: Medicare Other

## 2023-08-02 DIAGNOSIS — I639 Cerebral infarction, unspecified: Secondary | ICD-10-CM | POA: Diagnosis not present

## 2023-08-03 LAB — CUP PACEART REMOTE DEVICE CHECK
Date Time Interrogation Session: 20241222230012
Implantable Pulse Generator Implant Date: 20210107

## 2023-08-13 ENCOUNTER — Ambulatory Visit (INDEPENDENT_AMBULATORY_CARE_PROVIDER_SITE_OTHER): Payer: Self-pay

## 2023-08-13 DIAGNOSIS — J309 Allergic rhinitis, unspecified: Secondary | ICD-10-CM | POA: Diagnosis not present

## 2023-08-17 ENCOUNTER — Telehealth: Payer: Self-pay | Admitting: Pharmacy Technician

## 2023-08-17 ENCOUNTER — Other Ambulatory Visit: Payer: Self-pay | Admitting: Pulmonary Disease

## 2023-08-17 NOTE — Telephone Encounter (Signed)
 Auth Submission: NO AUTH NEEDED Site of care: Site of care: [facility] Payer: MEDICARE A/B & EMBLEM SUPP Medication & CPT/J Code(s) submitted: Nucala  (Mepolizumab ) G7817 Route of submission (phone, fax, portal):  Phone # Fax # Auth type: Buy/Bill PB Units/visits requested: Q28 DAYS Reference number:  Approval from: 08/17/23 to 07/16/24

## 2023-08-25 ENCOUNTER — Ambulatory Visit (INDEPENDENT_AMBULATORY_CARE_PROVIDER_SITE_OTHER): Payer: Self-pay

## 2023-08-25 DIAGNOSIS — J309 Allergic rhinitis, unspecified: Secondary | ICD-10-CM

## 2023-08-27 ENCOUNTER — Other Ambulatory Visit: Payer: Self-pay

## 2023-08-27 ENCOUNTER — Ambulatory Visit (INDEPENDENT_AMBULATORY_CARE_PROVIDER_SITE_OTHER): Payer: Medicare Other

## 2023-08-27 VITALS — BP 151/82 | HR 71 | Temp 97.9°F | Resp 16 | Ht 67.0 in | Wt 188.0 lb

## 2023-08-27 DIAGNOSIS — J4550 Severe persistent asthma, uncomplicated: Secondary | ICD-10-CM | POA: Diagnosis not present

## 2023-08-27 DIAGNOSIS — Z122 Encounter for screening for malignant neoplasm of respiratory organs: Secondary | ICD-10-CM

## 2023-08-27 DIAGNOSIS — Z87891 Personal history of nicotine dependence: Secondary | ICD-10-CM

## 2023-08-27 MED ORDER — MEPOLIZUMAB 100 MG ~~LOC~~ SOLR
100.0000 mg | Freq: Once | SUBCUTANEOUS | Status: AC
Start: 2023-08-27 — End: 2023-08-27
  Administered 2023-08-27: 100 mg via SUBCUTANEOUS
  Filled 2023-08-27: qty 1

## 2023-08-27 NOTE — Progress Notes (Signed)
Diagnosis: Asthma  Provider:  Chilton Greathouse MD  Procedure: Injection  Nucala (Mepolizumab), Dose: 100 mg, Site: subcutaneous, Number of injections: 1  Injection Site(s): Left arm  Post Care: Patient declined observation  Discharge: Condition: Good, Destination: Home . AVS Provided  Performed by:  Loney Hering, LPN

## 2023-08-30 ENCOUNTER — Telehealth: Payer: Self-pay

## 2023-08-30 NOTE — Telephone Encounter (Signed)
Auth Submission: NO AUTH NEEDED Site of care: Site of care: [facility] Payer: MEDICARE A/B & EMBLEM SUPP Medication & CPT/J Code(s) submitted: Nucala (Mepolizumab) [id number] Route of submission (phone, fax, portal):  Phone # Fax # Auth type: Buy/Bill PB Units/visits requested: Q28 DAYS Reference number:  Approval from: 08/30/23 to 08/09/24

## 2023-09-06 ENCOUNTER — Ambulatory Visit: Payer: Medicare Other

## 2023-09-06 DIAGNOSIS — I639 Cerebral infarction, unspecified: Secondary | ICD-10-CM

## 2023-09-06 LAB — CUP PACEART REMOTE DEVICE CHECK
Date Time Interrogation Session: 20250126230044
Implantable Pulse Generator Implant Date: 20210107

## 2023-09-08 ENCOUNTER — Ambulatory Visit (INDEPENDENT_AMBULATORY_CARE_PROVIDER_SITE_OTHER): Payer: Self-pay | Admitting: *Deleted

## 2023-09-08 DIAGNOSIS — J309 Allergic rhinitis, unspecified: Secondary | ICD-10-CM

## 2023-09-13 NOTE — Progress Notes (Signed)
 Carelink Summary Report / Loop Recorder

## 2023-09-13 NOTE — Addendum Note (Signed)
Addended by: Geralyn Flash D on: 09/13/2023 09:38 AM   Modules accepted: Orders

## 2023-09-22 ENCOUNTER — Ambulatory Visit (INDEPENDENT_AMBULATORY_CARE_PROVIDER_SITE_OTHER): Payer: Self-pay

## 2023-09-22 DIAGNOSIS — J309 Allergic rhinitis, unspecified: Secondary | ICD-10-CM | POA: Diagnosis not present

## 2023-09-24 ENCOUNTER — Encounter: Payer: Medicare Other | Attending: Pulmonary Disease | Admitting: *Deleted

## 2023-09-24 VITALS — BP 148/93 | HR 87 | Temp 97.7°F | Resp 16

## 2023-09-24 DIAGNOSIS — J4550 Severe persistent asthma, uncomplicated: Secondary | ICD-10-CM | POA: Insufficient documentation

## 2023-09-24 DIAGNOSIS — Z79620 Long term (current) use of immunosuppressive biologic: Secondary | ICD-10-CM | POA: Insufficient documentation

## 2023-09-24 MED ORDER — MEPOLIZUMAB 100 MG ~~LOC~~ SOLR
100.0000 mg | Freq: Once | SUBCUTANEOUS | Status: AC
  Administered 2023-09-24: 100 mg via SUBCUTANEOUS

## 2023-09-24 NOTE — Progress Notes (Signed)
Diagnosis: Asthma  Provider:   Audie Box, DO  Procedure: Injection  Nucala (Mepolizumab), Dose: 100 mg, Site: subcutaneous, Number of injections: 1  Injection Site(s): Right arm  Post Care: Observation period completed  Discharge: Condition: Good, Destination: Home . AVS Provided  Performed by:  Daleen Squibb, RN

## 2023-10-06 ENCOUNTER — Ambulatory Visit (INDEPENDENT_AMBULATORY_CARE_PROVIDER_SITE_OTHER): Payer: Medicare Other

## 2023-10-06 DIAGNOSIS — J309 Allergic rhinitis, unspecified: Secondary | ICD-10-CM | POA: Diagnosis not present

## 2023-10-11 ENCOUNTER — Ambulatory Visit: Payer: Medicare Other

## 2023-10-11 DIAGNOSIS — I639 Cerebral infarction, unspecified: Secondary | ICD-10-CM | POA: Diagnosis not present

## 2023-10-11 LAB — CUP PACEART REMOTE DEVICE CHECK
Date Time Interrogation Session: 20250302230201
Implantable Pulse Generator Implant Date: 20210107

## 2023-10-13 ENCOUNTER — Encounter: Payer: Self-pay | Admitting: Internal Medicine

## 2023-10-13 MED ORDER — OLOPATADINE HCL 0.1 % OP SOLN: 1.0000 [drp] | Freq: Two times a day (BID) | OPHTHALMIC | 0 refills | Status: AC

## 2023-10-13 NOTE — Addendum Note (Signed)
 Addended by: Robet Leu A on: 10/13/2023 03:19 PM   Modules accepted: Orders

## 2023-10-15 NOTE — Progress Notes (Signed)
 Carelink Summary Report / Loop Recorder

## 2023-10-18 ENCOUNTER — Ambulatory Visit: Payer: Medicare Other

## 2023-10-20 ENCOUNTER — Ambulatory Visit (INDEPENDENT_AMBULATORY_CARE_PROVIDER_SITE_OTHER): Payer: Self-pay

## 2023-10-20 DIAGNOSIS — J309 Allergic rhinitis, unspecified: Secondary | ICD-10-CM | POA: Diagnosis not present

## 2023-10-22 ENCOUNTER — Encounter: Payer: Medicare Other | Attending: Pulmonary Disease | Admitting: Internal Medicine

## 2023-10-22 VITALS — BP 133/98 | HR 76 | Temp 97.8°F | Resp 16

## 2023-10-22 DIAGNOSIS — Z79620 Long term (current) use of immunosuppressive biologic: Secondary | ICD-10-CM | POA: Insufficient documentation

## 2023-10-22 DIAGNOSIS — J4550 Severe persistent asthma, uncomplicated: Secondary | ICD-10-CM | POA: Diagnosis present

## 2023-10-22 MED ORDER — MEPOLIZUMAB 100 MG ~~LOC~~ SOLR
100.0000 mg | Freq: Once | SUBCUTANEOUS | Status: AC
  Administered 2023-10-22: 100 mg via SUBCUTANEOUS

## 2023-10-22 NOTE — Progress Notes (Signed)
 Diagnosis: Asthma  Provider:   Lorra Hals, DO  Procedure: Injection  Nucala (Mepolizumab), Dose: 100 mg, Site: subcutaneous, Number of injections: 1  Injection Site(s): Right arm  Post Care: Observation period completed  Discharge: Condition: Good, Destination: Home . AVS Provided  Performed by:  Cleotilde Neer, LPN

## 2023-11-10 ENCOUNTER — Ambulatory Visit (INDEPENDENT_AMBULATORY_CARE_PROVIDER_SITE_OTHER): Payer: Self-pay

## 2023-11-10 DIAGNOSIS — J309 Allergic rhinitis, unspecified: Secondary | ICD-10-CM

## 2023-11-12 NOTE — Progress Notes (Signed)
 Carelink Summary Report / Loop Recorder

## 2023-11-15 ENCOUNTER — Ambulatory Visit (INDEPENDENT_AMBULATORY_CARE_PROVIDER_SITE_OTHER): Payer: Medicare Other

## 2023-11-15 DIAGNOSIS — I639 Cerebral infarction, unspecified: Secondary | ICD-10-CM

## 2023-11-16 ENCOUNTER — Encounter: Payer: Self-pay | Admitting: Internal Medicine

## 2023-11-16 LAB — CUP PACEART REMOTE DEVICE CHECK
Date Time Interrogation Session: 20250406230105
Implantable Pulse Generator Implant Date: 20210107

## 2023-11-24 ENCOUNTER — Encounter: Attending: Pulmonary Disease

## 2023-11-24 VITALS — BP 148/96 | HR 76 | Temp 97.7°F | Resp 16

## 2023-11-24 DIAGNOSIS — J4550 Severe persistent asthma, uncomplicated: Secondary | ICD-10-CM | POA: Diagnosis present

## 2023-11-24 MED ORDER — MEPOLIZUMAB 100 MG ~~LOC~~ SOLR
100.0000 mg | Freq: Once | SUBCUTANEOUS | Status: AC
  Administered 2023-11-24: 100 mg via SUBCUTANEOUS

## 2023-11-24 NOTE — Progress Notes (Signed)
 Diagnosis: Asthma  Provider:   Antonio Baumgarten  Procedure: Injection  Nucala (Mepolizumab), Dose: 100 mg, Site: subcutaneous, Number of injections: 1  Injection Site(s): Left arm  Post Care: Patient declined observation  Discharge: Condition: Good, Destination: Home . AVS Declined  Performed by:  Natividad Balding, RN

## 2023-12-08 ENCOUNTER — Ambulatory Visit (INDEPENDENT_AMBULATORY_CARE_PROVIDER_SITE_OTHER)

## 2023-12-08 DIAGNOSIS — J309 Allergic rhinitis, unspecified: Secondary | ICD-10-CM | POA: Diagnosis not present

## 2023-12-15 ENCOUNTER — Ambulatory Visit (INDEPENDENT_AMBULATORY_CARE_PROVIDER_SITE_OTHER): Payer: Self-pay

## 2023-12-15 DIAGNOSIS — J309 Allergic rhinitis, unspecified: Secondary | ICD-10-CM

## 2023-12-20 ENCOUNTER — Ambulatory Visit (INDEPENDENT_AMBULATORY_CARE_PROVIDER_SITE_OTHER): Payer: Medicare Other

## 2023-12-20 DIAGNOSIS — I639 Cerebral infarction, unspecified: Secondary | ICD-10-CM

## 2023-12-21 ENCOUNTER — Ambulatory Visit: Payer: Self-pay | Admitting: Internal Medicine

## 2023-12-21 LAB — CUP PACEART REMOTE DEVICE CHECK
Date Time Interrogation Session: 20250511233226
Implantable Pulse Generator Implant Date: 20210107

## 2023-12-22 ENCOUNTER — Encounter: Attending: Pulmonary Disease | Admitting: *Deleted

## 2023-12-22 VITALS — BP 175/98 | HR 83 | Temp 97.8°F | Resp 18

## 2023-12-22 DIAGNOSIS — J4550 Severe persistent asthma, uncomplicated: Secondary | ICD-10-CM | POA: Diagnosis present

## 2023-12-22 MED ORDER — MEPOLIZUMAB 100 MG ~~LOC~~ SOLR
100.0000 mg | Freq: Once | SUBCUTANEOUS | Status: AC
  Administered 2023-12-22: 100 mg via SUBCUTANEOUS

## 2023-12-22 NOTE — Progress Notes (Signed)
 Diagnosis: Asthma  Provider:  Antonio Baumgarten, DO  Procedure: Injection  Nucala  (Mepolizumab ), Dose: 100 mg, Site: subcutaneous, Number of injections: 1  Injection Site(s): Right arm  Post Care: Observation period completed  Discharge: Condition: Good, Destination: Home . AVS Provided  Performed by:  Yovanni Frenette Ragsdale, RN

## 2023-12-23 ENCOUNTER — Encounter: Payer: Self-pay | Admitting: Pulmonary Disease

## 2024-01-04 NOTE — Progress Notes (Signed)
 Carelink Summary Report / Loop Recorder

## 2024-01-04 NOTE — Addendum Note (Signed)
 Addended by: Edra Govern D on: 01/04/2024 04:44 PM   Modules accepted: Orders

## 2024-01-05 ENCOUNTER — Ambulatory Visit (INDEPENDENT_AMBULATORY_CARE_PROVIDER_SITE_OTHER): Payer: Self-pay

## 2024-01-05 DIAGNOSIS — J309 Allergic rhinitis, unspecified: Secondary | ICD-10-CM

## 2024-01-12 ENCOUNTER — Ambulatory Visit (INDEPENDENT_AMBULATORY_CARE_PROVIDER_SITE_OTHER): Payer: Self-pay

## 2024-01-12 DIAGNOSIS — J309 Allergic rhinitis, unspecified: Secondary | ICD-10-CM

## 2024-01-13 ENCOUNTER — Ambulatory Visit: Payer: Self-pay

## 2024-01-13 NOTE — Telephone Encounter (Signed)
 Copied from CRM [id number]. Topic: Clinical - Red Word Triage >> Jan 13, 2024 10:33 AM Crispin Dolphin wrote: Red Word that prompted transfer to Nurse Triage: Extreme fatigue, sore throat, body aches - called [facility] - New patient   FYI Only or Action Required?: FYI only for provider  Called Nurse Triage reporting Generalized Body Aches. Symptoms began yesterday. Interventions attempted: OTC medications:  Caleb Evans Symptoms are: gradually worsening.  Triage Disposition: See PCP When Office is Open (Within 3 Days)  Patient/caregiver understands and will follow disposition?: Yes  New patient appointment made, patient will see either current PCP or go to Urgent Care.    Reason for Disposition  [1] MILD or MODERATE muscle aches or pain AND [2] taking a statin medicine (a lipid or cholesterol lowering drug)  Answer Assessment - Initial Assessment Questions 1. ONSET: "When did the muscle aches or body pains start?"      Yesterday  2. LOCATION: "What part of your body is hurting?" (e.g., entire body, arms, legs)      Diffuse  3. SEVERITY: "How bad is the pain?" (Scale 1-10; or mild, moderate, severe)   - MILD (1-3): doesn't interfere with normal activities    - MODERATE (4-7): interferes with normal activities or awakens from sleep    - SEVERE (8-10):  excruciating pain, unable to do any normal activities      Moderate  4. CAUSE: "What do you think is causing the pains?"     Unsure  5. FEVER: "Have you been having fever?"     No 6. OTHER SYMPTOMS: "Do you have any other symptoms?" (e.g., chest pain, weakness, rash, cold or flu symptoms, weight loss)     Sore throat, fatigue  Protocols used: Muscle Aches and Body Pain-A-AH

## 2024-01-14 ENCOUNTER — Other Ambulatory Visit (HOSPITAL_COMMUNITY): Payer: Self-pay | Admitting: Family Medicine

## 2024-01-14 ENCOUNTER — Ambulatory Visit (HOSPITAL_COMMUNITY)
Admission: RE | Admit: 2024-01-14 | Discharge: 2024-01-14 | Disposition: A | Discharge status: Home / Self Care | Source: Ambulatory Visit | Attending: Family Medicine | Admitting: Family Medicine

## 2024-01-14 DIAGNOSIS — R059 Cough, unspecified: Secondary | ICD-10-CM

## 2024-01-19 ENCOUNTER — Ambulatory Visit

## 2024-01-20 ENCOUNTER — Ambulatory Visit

## 2024-01-20 DIAGNOSIS — I639 Cerebral infarction, unspecified: Secondary | ICD-10-CM | POA: Diagnosis not present

## 2024-01-20 LAB — CUP PACEART REMOTE DEVICE CHECK
Date Time Interrogation Session: 20250611230151
Implantable Pulse Generator Implant Date: 20210107

## 2024-01-21 ENCOUNTER — Ambulatory Visit (INDEPENDENT_AMBULATORY_CARE_PROVIDER_SITE_OTHER): Payer: Self-pay

## 2024-01-21 DIAGNOSIS — J309 Allergic rhinitis, unspecified: Secondary | ICD-10-CM

## 2024-01-23 ENCOUNTER — Ambulatory Visit: Payer: Self-pay | Admitting: Internal Medicine

## 2024-01-28 ENCOUNTER — Ambulatory Visit (INDEPENDENT_AMBULATORY_CARE_PROVIDER_SITE_OTHER): Payer: Self-pay

## 2024-01-28 DIAGNOSIS — J309 Allergic rhinitis, unspecified: Secondary | ICD-10-CM

## 2024-02-02 ENCOUNTER — Ambulatory Visit (INDEPENDENT_AMBULATORY_CARE_PROVIDER_SITE_OTHER): Payer: Self-pay

## 2024-02-02 DIAGNOSIS — J309 Allergic rhinitis, unspecified: Secondary | ICD-10-CM

## 2024-02-03 ENCOUNTER — Encounter: Attending: Pulmonary Disease | Admitting: *Deleted

## 2024-02-03 VITALS — BP 127/82 | HR 72 | Temp 97.8°F | Resp 18

## 2024-02-03 DIAGNOSIS — J4550 Severe persistent asthma, uncomplicated: Secondary | ICD-10-CM | POA: Insufficient documentation

## 2024-02-03 MED ORDER — MEPOLIZUMAB 100 MG ~~LOC~~ SOLR
100.0000 mg | Freq: Once | SUBCUTANEOUS | Status: AC
  Administered 2024-02-03: 100 mg via SUBCUTANEOUS

## 2024-02-03 NOTE — Progress Notes (Signed)
 Diagnosis: Asthma  Provider:  Adine Gift, DO  Procedure: Injection  Nucala  (Mepolizumab ), Dose: 100 mg, Site: subcutaneous, Number of injections: 1  Injection Site(s): Left arm  Post Care: Observation period completed  Discharge: Condition: Good, Destination: Home . AVS Provided  Performed by:  Baldwin Darice Helling, RN

## 2024-02-08 NOTE — Progress Notes (Signed)
 Carelink Summary Report / Loop Recorder

## 2024-02-08 NOTE — Addendum Note (Signed)
 Addended by: TAWNI DRILLING D on: 02/08/2024 09:55 AM   Modules accepted: Orders

## 2024-02-21 ENCOUNTER — Ambulatory Visit (INDEPENDENT_AMBULATORY_CARE_PROVIDER_SITE_OTHER)

## 2024-02-21 DIAGNOSIS — I639 Cerebral infarction, unspecified: Secondary | ICD-10-CM

## 2024-02-22 LAB — CUP PACEART REMOTE DEVICE CHECK
Date Time Interrogation Session: 20250713230426
Implantable Pulse Generator Implant Date: 20210107

## 2024-02-23 ENCOUNTER — Ambulatory Visit: Payer: Self-pay | Admitting: Internal Medicine

## 2024-03-01 ENCOUNTER — Ambulatory Visit (INDEPENDENT_AMBULATORY_CARE_PROVIDER_SITE_OTHER): Payer: Self-pay

## 2024-03-01 DIAGNOSIS — J309 Allergic rhinitis, unspecified: Secondary | ICD-10-CM | POA: Diagnosis not present

## 2024-03-02 ENCOUNTER — Ambulatory Visit

## 2024-03-02 ENCOUNTER — Ambulatory Visit (INDEPENDENT_AMBULATORY_CARE_PROVIDER_SITE_OTHER): Payer: PRIVATE HEALTH INSURANCE | Admitting: *Deleted

## 2024-03-02 VITALS — BP 137/83 | HR 77 | Temp 97.8°F | Resp 16 | Ht 67.0 in | Wt 183.2 lb

## 2024-03-02 DIAGNOSIS — J4550 Severe persistent asthma, uncomplicated: Secondary | ICD-10-CM | POA: Diagnosis not present

## 2024-03-02 MED ORDER — MEPOLIZUMAB 100 MG ~~LOC~~ SOLR
100.0000 mg | Freq: Once | SUBCUTANEOUS | Status: AC
Start: 2024-03-02 — End: 2024-03-02
  Administered 2024-03-02: 100 mg via SUBCUTANEOUS
  Filled 2024-03-02: qty 1

## 2024-03-02 NOTE — Progress Notes (Signed)
 Diagnosis: Severe persistent Asthma  Provider:  Mannam, Praveen MD  Procedure: Injection  Nucala  (Mepolizumab ), Dose: 100 mg, Site: subcutaneous, Number of injections: 1  Injection Site(s): Right arm  Post Care: Observation period completed  Discharge: Condition: Good, Destination: Home . AVS Provided  Performed by:  Trudy Lamarr LABOR, RN

## 2024-03-10 NOTE — Progress Notes (Signed)
 Carelink Summary Report / Loop Recorder

## 2024-03-14 ENCOUNTER — Ambulatory Visit: Admitting: Physician Assistant

## 2024-03-14 ENCOUNTER — Encounter: Payer: Self-pay | Admitting: Physician Assistant

## 2024-03-14 VITALS — BP 119/76 | HR 67 | Temp 98.1°F | Ht 67.0 in | Wt 184.0 lb

## 2024-03-14 DIAGNOSIS — E782 Mixed hyperlipidemia: Secondary | ICD-10-CM

## 2024-03-14 DIAGNOSIS — J4489 Other specified chronic obstructive pulmonary disease: Secondary | ICD-10-CM

## 2024-03-14 DIAGNOSIS — Z7689 Persons encountering health services in other specified circumstances: Secondary | ICD-10-CM

## 2024-03-14 DIAGNOSIS — I10 Essential (primary) hypertension: Secondary | ICD-10-CM

## 2024-03-14 DIAGNOSIS — I714 Abdominal aortic aneurysm, without rupture, unspecified: Secondary | ICD-10-CM | POA: Diagnosis not present

## 2024-03-14 MED ORDER — AIRSUPRA 90-80 MCG/ACT IN AERO: 1.0000 | INHALATION_SPRAY | RESPIRATORY_TRACT | 1 refills | Status: DC | PRN

## 2024-03-14 NOTE — Assessment & Plan Note (Signed)
 Overall doing well. Reports albuterol  has not been as effective for him. Trial with Airsupra . Continue Symbicort . Follow up with pulmonology.

## 2024-03-14 NOTE — Assessment & Plan Note (Signed)
 Stable. Continue with current management without changes. Discussed healthy diet and lifestyle. Lipid panel today.

## 2024-03-14 NOTE — Assessment & Plan Note (Signed)
 119/76 Controlled. Continue current medications. No change in management. Discussed DASH diet and dietary sodium restrictions.  Continue dietary efforts and physical activity.

## 2024-03-14 NOTE — Progress Notes (Signed)
 New Patient Office Visit  Subjective    Patient ID: Caleb Evans, male    DOB: 04-20-1951  Age: 73 y.o. MRN: [id number]  CC: No chief complaint on file.   HPI Caleb Evans presents to establish care  Patient presents today with past medical history significant for hypertension, hyperlipidemia, GERD, asthma/COPD, allergic rhinitis. history of CVA, and AAA without rupture. Patient reports compliance with medications and follows regularly with pulmonology, cardiology, and allergy /immunology. Today he reports overall feeling well, but states concerns albuterol  is not working as well for him. Endorses increased shortness of breath today, denies chest pain. Takes Symbicort  daily, using biologics for management of asthma and allergic rhinitis.   Outpatient Encounter Medications as of 03/14/2024  Medication Sig   Albuterol -Budesonide  (AIRSUPRA ) 90-80 MCG/ACT AERO Inhale 1 puff into the lungs as needed.   albuterol  (VENTOLIN  HFA) 108 (90 Base) MCG/ACT inhaler Inhale 1-2 puffs into the lungs every 6 (six) hours as needed for wheezing or shortness of breath.   aspirin  81 MG chewable tablet Chew 1 tablet (81 mg total) by mouth daily.   atorvastatin  (LIPITOR ) 80 MG tablet Take 80 mg by mouth at bedtime.   azelastine  (ASTELIN ) 0.1 % nasal spray Place 2 sprays into both nostrils 2 (two) times daily as needed for rhinitis or allergies.   benzonatate  (TESSALON ) 100 MG capsule Take 2 capsules (200 mg total) by mouth 3 (three) times daily as needed for cough.   budesonide -formoterol  (SYMBICORT ) 160-4.5 MCG/ACT inhaler Inhale 2 puffs into the lungs 2 (two) times daily.   Calcium  Carb-Cholecalciferol (CALCIUM  600+D3 PO) Take 1 tablet by mouth daily.   cetirizine  (ZYRTEC  ALLERGY ) 10 MG tablet Take 1 tablet (10 mg total) by mouth daily as needed for allergies.   EPINEPHrine  (AUVI-Q ) 0.3 mg/0.3 mL IJ SOAJ injection Inject 0.3 mg into the muscle as needed for anaphylaxis. As directed for  life-threatening allergic reactions   EPINEPHrine  (EPIPEN  2-PAK) 0.3 mg/0.3 mL IJ SOAJ injection Inject 0.3 mg into the muscle as needed for anaphylaxis.   fluticasone  (FLONASE ) 50 MCG/ACT nasal spray Place 2 sprays into both nostrils daily.   hydrochlorothiazide (HYDRODIURIL) 12.5 MG tablet Take 12.5 mg by mouth daily.   ipratropium-albuterol  (DUONEB) 0.5-2.5 (3) MG/3ML SOLN Take 3 mLs by nebulization every 4 (four) hours as needed.   losartan (COZAAR) 25 MG tablet Take 25 mg by mouth daily.   Mepolizumab  (NUCALA ) 100 MG/ML SOAJ Inject into the skin.   Multiple Vitamin (MULTIVITAMIN WITH MINERALS) TABS tablet Take 1 tablet by mouth daily.   olopatadine  (PATANOL) 0.1 % ophthalmic solution Place 1 drop into both eyes 2 (two) times daily.   Respiratory Therapy Supplies (NEBULIZER MASK ADULT) MISC 1 each by Does not apply route as needed.   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   Bladder mass    Bladder neoplasm    Cancer (HCC)    bladder cancer   COPD (chronic obstructive pulmonary disease) (HCC)    Crohn's disease (  HCC)    CVA (cerebral vascular accident) (HCC) 05/29/2019   Diverticulitis    Former smoker 05/29/2019   GERD (gastroesophageal reflux disease)    takes Nexium off and on   Hyperlipidemia 11/22/2017   Over weight    Seasonal and perennial allergic rhinitis 04/19/2017   Skin lesion    Thoracic aortic aneurysm (HCC)    Weak urinary stream     Past Surgical History:  Procedure Laterality Date   APPENDECTOMY      CHOLECYSTECTOMY     LAPAROSCOPIC PARTIAL COLECTOMY  01/18/2018   ERAS PATHWAY   LAPAROSCOPIC PARTIAL COLECTOMY N/A 01/18/2018   Procedure: LAPAROSCOPIC PARTIAL COLECTOMY ERAS PATHWAY;  Surgeon: Aron Shoulders, MD;  Location: [facility] OR;  Service: General;  Laterality: N/A;   TONSILLECTOMY      Family History  Adopted: Yes  Problem Relation Age of Onset   Healthy Son    Healthy Daughter    Bladder Cancer Neg Hx    Kidney cancer Neg Hx    Prostate cancer Neg Hx    Allergic rhinitis Neg Hx    Angioedema Neg Hx    Asthma Neg Hx    Eczema Neg Hx    Urticaria Neg Hx    Immunodeficiency Neg Hx    Atopy Neg Hx     Social History   Socioeconomic History   Marital status: Married    Spouse name: Not on file   Number of children: 2   Years of education: Not on file   Highest education level: Associate degree: occupational, Scientist, product/process development, or vocational program  Occupational History   Not on file  Tobacco Use   Smoking status: Former    Current packs/day: 0.00    Average packs/day: 3.0 packs/day for 50.0 years (150.0 ttl pk-yrs)    Types: Cigarettes    Start date: 04/10/1968    Quit date: 04/10/2018    Years since quitting: 5.9   Smokeless tobacco: Never  Vaping Use   Vaping status: Never Used  Substance and Sexual Activity   Alcohol use: Yes    Alcohol/week: 0.0 standard drinks of alcohol    Comment: occasional   Drug use: No   Sexual activity: Not on file  Other Topics Concern   Not on file  Social History Narrative   Patient is adopted   Married, lives with spouse   2 children (boy and girl)   OCCUPATION: Curator x5yrs.  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  Eyes:  Negative for blurred vision and double vision.  Respiratory:  Positive for cough and shortness of breath.   Cardiovascular:  Negative for chest pain and palpitations.  Musculoskeletal:  Negative for joint pain and myalgias.  Neurological:  Negative for dizziness and headaches.        Objective    BP 119/76   Pulse 67   Temp 98.1 F (36.7 C)   Ht 5' 7 (1.702  m)   Wt 184 lb (83.5 kg)   SpO2 97%   BMI 28.82 kg/m   Physical Exam Constitutional:      Appearance: Normal appearance.  HENT:     Head: Normocephalic.     Mouth/Throat:     Mouth: Mucous membranes are moist.     Pharynx: Oropharynx is clear.  Eyes:     Extraocular Movements: Extraocular movements intact.     Conjunctiva/sclera: Conjunctivae normal.  Cardiovascular:     Rate and Rhythm: Normal rate and regular rhythm.     Heart sounds: Normal heart sounds. No murmur heard.    No gallop.  Pulmonary:     Effort: Pulmonary effort is normal.     Breath sounds: Normal breath sounds. No wheezing or rales.  Skin:    General: Skin is warm and dry.  Neurological:     General: No focal deficit present.     Mental Status: He is alert and oriented to person, place, and time.  Psychiatric:        Mood and Affect: Mood normal.        Behavior: Behavior normal.       Assessment & Plan:  Encounter to establish care  Asthma-COPD overlap syndrome [facility]) Assessment & Plan: Overall doing well. Reports albuterol  has not been as effective for him. Trial with Airsupra . Continue Symbicort . Follow up with pulmonology.   Orders: -     Airsupra ; Inhale 1 puff into the lungs as needed.  Dispense: 5.9 g; Refill: 1 -     Pulmonary Visit  Mixed hyperlipidemia Assessment & Plan: Stable. Continue with current management without changes. Discussed healthy  diet and lifestyle. Lipid panel today.   Orders: -     Lipid panel  Primary hypertension Assessment & Plan: 119/76 Controlled. Continue current medications. No change in management. Discussed DASH diet and dietary sodium restrictions.  Continue dietary efforts and physical activity.   Orders: -     CMP14+EGFR -     CBC with Differential/Platelet  Abdominal aortic aneurysm (AAA) without rupture, unspecified part (HCC) Assessment & Plan: Stable. Has been seen and imaged with vascular in the past. Continue yearly CT scans.     No follow-ups on file.   Charmaine Jaquita Bessire, PA-C

## 2024-03-14 NOTE — Assessment & Plan Note (Signed)
 Stable. Has been seen and imaged with vascular in the past. Continue yearly CT scans.

## 2024-03-15 ENCOUNTER — Ambulatory Visit: Payer: Self-pay | Admitting: Physician Assistant

## 2024-03-15 LAB — CBC WITH DIFFERENTIAL/PLATELET
Basophils Absolute: 0 x10E3/uL (ref 0.0–0.2)
Basos: 0 %
EOS (ABSOLUTE): 0.1 x10E3/uL (ref 0.0–0.4)
Eos: 1 %
Hematocrit: 45.1 % (ref 37.5–51.0)
Hemoglobin: 15.2 g/dL (ref 13.0–17.7)
Immature Grans (Abs): 0 x10E3/uL (ref 0.0–0.1)
Immature Granulocytes: 0 %
Lymphocytes Absolute: 1.8 x10E3/uL (ref 0.7–3.1)
Lymphs: 25 %
MCH: 32.4 pg (ref 26.6–33.0)
MCHC: 33.7 g/dL (ref 31.5–35.7)
MCV: 96 fL (ref 79–97)
Monocytes Absolute: 0.5 x10E3/uL (ref 0.1–0.9)
Monocytes: 7 %
Neutrophils Absolute: 4.8 x10E3/uL (ref 1.4–7.0)
Neutrophils: 67 %
Platelets: 340 x10E3/uL (ref 150–450)
RBC: 4.69 x10E6/uL (ref 4.14–5.80)
RDW: 13.7 % (ref 11.6–15.4)
WBC: 7.2 x10E3/uL (ref 3.4–10.8)

## 2024-03-15 LAB — CMP14+EGFR
ALT: 34 IU/L (ref 0–44)
AST: 25 IU/L (ref 0–40)
Albumin: 4.7 g/dL (ref 3.8–4.8)
Alkaline Phosphatase: 119 IU/L (ref 44–121)
BUN/Creatinine Ratio: 11 (ref 10–24)
BUN: 11 mg/dL (ref 8–27)
Bilirubin Total: 1.4 mg/dL — ABNORMAL HIGH (ref 0.0–1.2)
CO2: 25 mmol/L (ref 20–29)
Calcium: 10.2 mg/dL (ref 8.6–10.2)
Chloride: 95 mmol/L — ABNORMAL LOW (ref 96–106)
Creatinine, Ser: 0.99 mg/dL (ref 0.76–1.27)
Globulin, Total: 2.5 g/dL (ref 1.5–4.5)
Glucose: 112 mg/dL — ABNORMAL HIGH (ref 70–99)
Potassium: 4.6 mmol/L (ref 3.5–5.2)
Sodium: 136 mmol/L (ref 134–144)
Total Protein: 7.2 g/dL (ref 6.0–8.5)
eGFR: 81 mL/min/1.73 (ref >59)

## 2024-03-15 LAB — LIPID PANEL
Chol/HDL Ratio: 3.7 ratio (ref 0.0–5.0)
Cholesterol, Total: 146 mg/dL (ref 100–199)
HDL: 39 mg/dL — ABNORMAL LOW (ref >39)
LDL Chol Calc (NIH): 59 mg/dL (ref 0–99)
Triglycerides: 311 mg/dL — ABNORMAL HIGH (ref 0–149)
VLDL Cholesterol Cal: 48 mg/dL — ABNORMAL HIGH (ref 5–40)

## 2024-03-22 DIAGNOSIS — J301 Allergic rhinitis due to pollen: Secondary | ICD-10-CM | POA: Diagnosis not present

## 2024-03-22 NOTE — Progress Notes (Signed)
 VIALS MADE 03-22-24

## 2024-03-23 ENCOUNTER — Ambulatory Visit (INDEPENDENT_AMBULATORY_CARE_PROVIDER_SITE_OTHER)

## 2024-03-23 DIAGNOSIS — I639 Cerebral infarction, unspecified: Secondary | ICD-10-CM | POA: Diagnosis not present

## 2024-03-23 DIAGNOSIS — J3089 Other allergic rhinitis: Secondary | ICD-10-CM | POA: Diagnosis not present

## 2024-03-23 LAB — CUP PACEART REMOTE DEVICE CHECK
Date Time Interrogation Session: 20250813230551
Implantable Pulse Generator Implant Date: 20210107

## 2024-03-24 ENCOUNTER — Ambulatory Visit (INDEPENDENT_AMBULATORY_CARE_PROVIDER_SITE_OTHER): Payer: Self-pay

## 2024-03-24 DIAGNOSIS — J309 Allergic rhinitis, unspecified: Secondary | ICD-10-CM | POA: Diagnosis not present

## 2024-03-26 ENCOUNTER — Ambulatory Visit: Payer: Self-pay | Admitting: Internal Medicine

## 2024-03-27 ENCOUNTER — Other Ambulatory Visit: Payer: Self-pay | Admitting: Physician Assistant

## 2024-03-27 ENCOUNTER — Telehealth: Payer: Self-pay | Admitting: Family Medicine

## 2024-03-27 MED ORDER — HYDROCHLOROTHIAZIDE 12.5 MG PO TABS: 12.5000 mg | ORAL_TABLET | Freq: Every day | ORAL | 0 refills | Status: DC

## 2024-03-27 NOTE — Telephone Encounter (Unsigned)
 Copied from CRM [id number]. Topic: Clinical - Prescription Issue >> Mar 27, 2024 12:06 PM Delon DASEN wrote: Reason for CRM: Albuterol -Budesonide  (AIRSUPRA ) 90-80 MCG/ACT AERO - pharmacy needs clarification of instructions

## 2024-03-27 NOTE — Telephone Encounter (Signed)
 Copied from CRM [id number]. Topic: Clinical - Medication Refill >> Mar 27, 2024 12:08 PM Delon T wrote: No longer sees provider that originally prescribed Medication: hydrochlorothiazide  (HYDRODIURIL ) 12.5 MG tablet   Has the patient contacted their pharmacy? Yes (Agent: If no, request that the patient contact the pharmacy for the refill. If patient does not wish to contact the pharmacy document the reason why and proceed with request.) (Agent: If yes, when and what did the pharmacy advise?)  This is the patient's preferred pharmacy:    [facility] [phone] - [place], [place] - [address] AT [facility] [address] [place] [address] Phone: [phone] Fax: [phone]  Is this the correct pharmacy for this prescription? Yes If no, delete pharmacy and type the correct one.   Has the prescription been filled recently? Yes  Is the patient out of the medication? Yes  Has the patient been seen for an appointment in the last year OR does the patient have an upcoming appointment? Yes  Can we respond through MyChart? Yes  Agent: Please be advised that Rx refills may take up to 3 business days. We ask that you follow-up with your pharmacy.

## 2024-03-30 ENCOUNTER — Ambulatory Visit (INDEPENDENT_AMBULATORY_CARE_PROVIDER_SITE_OTHER)

## 2024-03-30 ENCOUNTER — Other Ambulatory Visit: Payer: Self-pay

## 2024-03-30 VITALS — BP 122/83 | HR 83 | Temp 97.3°F | Resp 18 | Ht 67.0 in | Wt 180.6 lb

## 2024-03-30 DIAGNOSIS — J4550 Severe persistent asthma, uncomplicated: Secondary | ICD-10-CM

## 2024-03-30 DIAGNOSIS — J4489 Other specified chronic obstructive pulmonary disease: Secondary | ICD-10-CM

## 2024-03-30 MED ORDER — AIRSUPRA 90-80 MCG/ACT IN AERO: 1.0000 | INHALATION_SPRAY | Freq: Every day | RESPIRATORY_TRACT | 1 refills | Status: AC

## 2024-03-30 MED ORDER — ALBUTEROL SULFATE HFA 108 (90 BASE) MCG/ACT IN AERS: 1.0000 | INHALATION_SPRAY | Freq: Four times a day (QID) | RESPIRATORY_TRACT | 1 refills | Status: AC

## 2024-03-30 MED ORDER — MEPOLIZUMAB 100 MG ~~LOC~~ SOLR
100.0000 mg | Freq: Once | SUBCUTANEOUS | Status: AC
  Administered 2024-03-30: 100 mg via SUBCUTANEOUS
  Filled 2024-03-30: qty 1

## 2024-03-30 NOTE — Progress Notes (Signed)
 Diagnosis: Asthma  Provider:  Mannam, Praveen MD  Procedure: Injection  Nucala  (Mepolizumab ), Dose: 100 mg, Site: subcutaneous, Number of injections: 1  Injection Site(s): Left arm  Post Care: Patient declined observation  Discharge: Condition: Good, Destination: Home . AVS Declined  Performed by:  Samer Dutton, RN

## 2024-04-05 ENCOUNTER — Other Ambulatory Visit: Payer: Self-pay | Admitting: Acute Care

## 2024-04-05 DIAGNOSIS — Z122 Encounter for screening for malignant neoplasm of respiratory organs: Secondary | ICD-10-CM

## 2024-04-05 DIAGNOSIS — Z87891 Personal history of nicotine dependence: Secondary | ICD-10-CM

## 2024-04-18 NOTE — Progress Notes (Unsigned)
 Caleb Evans, male    DOB: 1951/03/14    MRN: [id number]   Brief patient profile:  5  yowm from [place]  quit smoking 2019  Icard pt self referred to pulmonary clinic in [place]  04/19/2024  for severe chronic asthma p 911 exposures    Spirometry  05/28/23   FEV1 1.96 (70%)  Ratio 0.64 with mild concavity on f/v loop    History of Present Illness  04/19/2024  Pulmonary/ 1st office eval/ Bishoy Cupp / [place] [facility] symbicort  160 / nucala  per allergy   Chief Complaint  Patient presents with   COPD    F/u   Dyspnea:  walking around the neighborhood some [place] s doe  Cough: crepe myrtles seem to set off dry cough / assoc mild rhnitis  Sleep: bed is flat sleep on R side 2 pillows  SABA use: once or twice a day at most  02: none  LDSCT: every September due   No obvious day to day or daytime pattern/variability or assoc excess/ purulent sputum or mucus plugs or hemoptysis or cp or chest tightness, subjective wheeze or overt  hb symptoms.    Also denies any obvious fluctuation of symptoms with weather or environmental changes or other aggravating or alleviating factors except as outlined above   No unusual exposure hx or h/o childhood pna/ asthma or knowledge of premature birth.  Current Allergies, Complete Past Medical History, Past Surgical History, Family History, and Social History were reviewed in [employer] record.  ROS  The following are not active complaints unless bolded Hoarseness, sore throat, dysphagia, dental problems, itching, sneezing,  nasal congestion or discharge of excess mucus or purulent secretions, ear ache,   fever, chills, sweats, unintended wt loss or wt gain, classically pleuritic or exertional cp,  orthopnea pnd or arm/hand swelling  or leg swelling, presyncope, palpitations, abdominal pain, anorexia, nausea, vomiting, diarrhea  or change in bowel habits or change in bladder habits, change in stools or change in urine, dysuria, hematuria,   rash, arthralgias, visual complaints, headache, numbness, weakness or ataxia or problems with walking or coordination,  change in mood or  memory.            Outpatient Medications Prior to Visit  Medication Sig Dispense Refill   albuterol  (VENTOLIN  HFA) 108 (90 Base) MCG/ACT inhaler Inhale 1-2 puffs into the lungs every 6 (six) hours. 8 g 1   Albuterol -Budesonide  (AIRSUPRA ) 90-80 MCG/ACT AERO Inhale 1 puff into the lungs daily. 5.9 g 1   aspirin  81 MG chewable tablet Chew 1 tablet (81 mg total) by mouth daily.     atorvastatin  (LIPITOR ) 80 MG tablet Take 80 mg by mouth at bedtime.     azelastine  (ASTELIN ) 0.1 % nasal spray Place 2 sprays into both nostrils 2 (two) times daily as needed for rhinitis or allergies. 30 mL 5   benzonatate  (TESSALON ) 100 MG capsule Take 2 capsules (200 mg total) by mouth 3 (three) times daily as needed for cough. 180 capsule 3   budesonide -formoterol  (SYMBICORT ) 160-4.5 MCG/ACT inhaler Inhale 2 puffs into the lungs 2 (two) times daily. 10.2 g 5   Calcium  Carb-Cholecalciferol (CALCIUM  600+D3 PO) Take 1 tablet by mouth daily.     cetirizine  (ZYRTEC  ALLERGY ) 10 MG tablet Take 1 tablet (10 mg total) by mouth daily as needed for allergies. 30 tablet 5   EPINEPHrine  (AUVI-Q ) 0.3 mg/0.3 mL IJ SOAJ injection Inject 0.3 mg into the muscle as needed for anaphylaxis. As directed for life-threatening  allergic reactions 2 each 1   EPINEPHrine  (EPIPEN  2-PAK) 0.3 mg/0.3 mL IJ SOAJ injection Inject 0.3 mg into the muscle as needed for anaphylaxis. 2 each 1   fluticasone  (FLONASE ) 50 MCG/ACT nasal spray Place 2 sprays into both nostrils daily. 16 g 5   hydrochlorothiazide  (HYDRODIURIL ) 12.5 MG tablet Take 1 tablet (12.5 mg total) by mouth daily. 30 tablet 0   ipratropium-albuterol  (DUONEB) 0.5-2.5 (3) MG/3ML SOLN Take 3 mLs by nebulization every 4 (four) hours as needed. 360 mL 2   losartan (COZAAR) 25 MG tablet Take 25 mg by mouth daily.     Mepolizumab  (NUCALA ) 100 MG/ML SOAJ  Inject into the skin.     Multiple Vitamin (MULTIVITAMIN WITH MINERALS) TABS tablet Take 1 tablet by mouth daily.     olopatadine  (PATANOL) 0.1 % ophthalmic solution Place 1 drop into both eyes 2 (two) times daily. 15 mL 0   Respiratory Therapy Supplies (NEBULIZER MASK ADULT) MISC 1 each by Does not apply route as needed. 1 each 0   VIAGRA 100 MG tablet SMARTSIG:1 Tablet(s) By Mouth     Facility-Administered Medications Prior to Visit  Medication Dose Route Frequency Provider Last Rate Last Admin   clindamycin (CLEOCIN) 900 mg in dextrose  5 % 50 mL IVPB  900 mg Intravenous 60 min Pre-Op Aron Shoulders, MD       And   gentamicin  (GARAMYCIN ) 390 mg in dextrose  5 % 50 mL IVPB  5 mg/kg Intravenous 60 min Pre-Op Aron Shoulders, MD       mepolizumab  (NUCALA ) injection 100 mg  100 mg Subcutaneous Once Brenna Adine CROME, DO        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   Bladder mass    Bladder neoplasm    Cancer (HCC)    bladder cancer   COPD (chronic obstructive pulmonary disease) (HCC)    Crohn's disease (HCC)    CVA (cerebral vascular accident) (HCC) 05/29/2019   Diverticulitis    Former smoker 05/29/2019   GERD (gastroesophageal reflux disease)    takes Nexium off and on   Hyperlipidemia 11/22/2017   Over weight    Seasonal and perennial allergic rhinitis 04/19/2017   Skin lesion    Thoracic aortic aneurysm (HCC)    Weak urinary stream       Objective:     BP 113/77   Pulse 84   Ht 5' 7 (1.702 m)   Wt 179 lb 9.6 oz (81.5 kg)   SpO2 96% Comment: ra  BMI 28.13 kg/m   SpO2: 96 % (ra) amb wm nad / edentulous with implants   HEENT : Oropharynx  clear          NECK :  without  apparent JVD/ palpable Nodes/TM    LUNGS: no acc muscle use,  Nl contour chest which is clear to A and P bilaterally without cough  on insp or exp maneuvers   CV:  RRR  no s3 or murmur or increase in P2, and no edema   ABD:  soft and nontender   MS:  Gait nl   ext warm without deformities Or obvious joint restrictions  calf tenderness, cyanosis or clubbing    SKIN: warm and dry without lesions    NEURO:  alert, approp, nl sensorium with  no motor  or cerebellar deficits apparent.       Assessment   Assessment & Plan Asthma-COPD overlap syndrome (HCC) On nucala  per RDS allergy  05/23/18 >>>  All goals of chronic asthma control met including optimal function and elimination of symptoms with minimal need for rescue therapy.  Contingencies discussed in full including contacting this office immediately if not controlling the symptoms using the rule of two's.     Advised against using alb when has access to [employer] and the theory behind the latter  - The proper method of use, as well as anticipated side effects, of a metered-dose hfa inhaler were discussed and demonstrated to the patient using an empty symicort device   Pulmonary f/u can be prn as long as meds being filled by allergy  - if not doing well return with all meds in hand using a trust but verify approach to confirm accurate Medication  Reconciliation The principal here is that until we are certain that the  patients are doing what we've asked, it makes no sense to ask them to do more.    Former cigarette smoker Low-dose CT lung cancer screening is recommended for patients who are 19-72 years of age with a 20+ pack-year history of smoking and who are currently smoking or quit <=15 years ago. No coughing up blood  No unintentional weight loss of > 15 pounds in the last 6 months - pt is eligible for scanning yearly until 2034>  already in program     Each maintenance medication was reviewed in detail including emphasizing most importantly the difference between maintenance and prns and under what circumstances the prns are to be triggered using an  action plan format where appropriate.  Total time for H and P, chart review, counseling, reviewing hfa  device(s) and generating customized AVS unique to this office visit / same day charting = 32 min pt new to me    AVS  Patient Instructions  Plan A = Automatic = Always=    symbiocrt 160 Take 2 puffs first thing in am and then another 2 puffs about 12 hours later.    Work on inhaler technique:  relax and gently blow all the way out then take a nice smooth full deep breath back in, triggering the inhaler at same time you start breathing in.  Hold breath in for at least  5 seconds if you can. Blow out symbicort   thru nose. Rinse and gargle with water when done.  If mouth or throat bother you at all,  try brushing teeth/gums/tongue with arm and hammer toothpaste/ make a slurry and gargle and spit out.      Plan B = Backup (to supplement plan A, not to replace it) Use your air Supra  inhaler as a rescue medication to be used if you can't catch your breath by resting or slowing your pace  or doing a relaxed purse lip breathing pattern.  - The less you use it, the better it will work when you need it. - Ok to use the inhaler up to 2 puffs  every 4 hours if you must but call for appointment if use goes up consistently over your usual need - Don't leave home without it !!  (think of it like the spare tire or starter fluid for your car)      Pulmonary follow up is as needed as long as allergy  is refilling your medications  If you do need to return,  bring all inhalers with you  Ozell America, MD 04/19/2024

## 2024-04-19 ENCOUNTER — Encounter: Payer: Self-pay | Admitting: Internal Medicine

## 2024-04-19 ENCOUNTER — Ambulatory Visit (INDEPENDENT_AMBULATORY_CARE_PROVIDER_SITE_OTHER): Admitting: Internal Medicine

## 2024-04-19 VITALS — BP 113/77 | HR 84 | Ht 67.0 in | Wt 179.6 lb

## 2024-04-19 DIAGNOSIS — J4489 Other specified chronic obstructive pulmonary disease: Secondary | ICD-10-CM

## 2024-04-19 DIAGNOSIS — Z87891 Personal history of nicotine dependence: Secondary | ICD-10-CM | POA: Diagnosis not present

## 2024-04-19 NOTE — Assessment & Plan Note (Addendum)
 On nucala  per RDS allergy  05/23/18 >>>  All goals of chronic asthma control met including optimal function and elimination of symptoms with minimal need for rescue therapy.  Contingencies discussed in full including contacting this office immediately if not controlling the symptoms using the rule of two's.     Advised against using alb when has access to [employer] and the theory behind the latter  - The proper method of use, as well as anticipated side effects, of a metered-dose hfa inhaler were discussed and demonstrated to the patient using an empty symicort device   Pulmonary f/u can be prn as long as meds being filled by allergy  - if not doing well return with all meds in hand using a trust but verify approach to confirm accurate Medication  Reconciliation The principal here is that until we are certain that the  patients are doing what we've asked, it makes no sense to ask them to do more.

## 2024-04-19 NOTE — Patient Instructions (Signed)
 Plan A = Automatic = Always=    symbiocrt 160 Take 2 puffs first thing in am and then another 2 puffs about 12 hours later.    Work on inhaler technique:  relax and gently blow all the way out then take a nice smooth full deep breath back in, triggering the inhaler at same time you start breathing in.  Hold breath in for at least  5 seconds if you can. Blow out symbicort   thru nose. Rinse and gargle with water when done.  If mouth or throat bother you at all,  try brushing teeth/gums/tongue with arm and hammer toothpaste/ make a slurry and gargle and spit out.      Plan B = Backup (to supplement plan A, not to replace it) Use your air Supra  inhaler as a rescue medication to be used if you can't catch your breath by resting or slowing your pace  or doing a relaxed purse lip breathing pattern.  - The less you use it, the better it will work when you need it. - Ok to use the inhaler up to 2 puffs  every 4 hours if you must but call for appointment if use goes up consistently over your usual need - Don't leave home without it !!  (think of it like the spare tire or starter fluid for your car)      Pulmonary follow up is as needed as long as allergy  is refilling your medications  If you do need to return,  bring all inhalers with you

## 2024-04-19 NOTE — Assessment & Plan Note (Addendum)
 Low-dose CT lung cancer screening is recommended for patients who are 44-71 years of age with a 20+ pack-year history of smoking and who are currently smoking or quit <=15 years ago. No coughing up blood  No unintentional weight loss of > 15 pounds in the last 6 months - pt is eligible for scanning yearly until 2034>  already in program     Each maintenance medication was reviewed in detail including emphasizing most importantly the difference between maintenance and prns and under what circumstances the prns are to be triggered using an action plan format where appropriate.  Total time for H and P, chart review, counseling, reviewing hfa  device(s) and generating customized AVS unique to this office visit / same day charting = 32 min pt new to me

## 2024-04-24 ENCOUNTER — Ambulatory Visit (INDEPENDENT_AMBULATORY_CARE_PROVIDER_SITE_OTHER)

## 2024-04-24 DIAGNOSIS — I639 Cerebral infarction, unspecified: Secondary | ICD-10-CM

## 2024-04-25 ENCOUNTER — Other Ambulatory Visit: Payer: Self-pay | Admitting: Physician Assistant

## 2024-04-25 LAB — CUP PACEART REMOTE DEVICE CHECK
Date Time Interrogation Session: 20250913230123
Implantable Pulse Generator Implant Date: 20210107

## 2024-04-25 NOTE — Telephone Encounter (Signed)
 Copied from CRM [id number]. Topic: Clinical - Medication Question >> Apr 25, 2024  1:10 PM Precious C wrote: Reason for CRM: pt would like a 90 supply of hydrochlorothiazide  (HYDRODIURIL ) 12.5 MG tablets medication instead of 30 day supply.

## 2024-04-26 ENCOUNTER — Ambulatory Visit (INDEPENDENT_AMBULATORY_CARE_PROVIDER_SITE_OTHER): Payer: Self-pay

## 2024-04-26 DIAGNOSIS — J309 Allergic rhinitis, unspecified: Secondary | ICD-10-CM | POA: Diagnosis not present

## 2024-04-26 MED ORDER — HYDROCHLOROTHIAZIDE 12.5 MG PO TABS: 12.5000 mg | ORAL_TABLET | Freq: Every day | ORAL | 1 refills | Status: DC

## 2024-04-27 ENCOUNTER — Ambulatory Visit (INDEPENDENT_AMBULATORY_CARE_PROVIDER_SITE_OTHER): Admitting: *Deleted

## 2024-04-27 VITALS — BP 133/81 | HR 83 | Temp 97.7°F | Resp 16 | Ht 67.0 in | Wt 179.6 lb

## 2024-04-27 DIAGNOSIS — J4550 Severe persistent asthma, uncomplicated: Secondary | ICD-10-CM

## 2024-04-27 MED ORDER — MEPOLIZUMAB 100 MG ~~LOC~~ SOLR
100.0000 mg | Freq: Once | SUBCUTANEOUS | Status: AC
  Administered 2024-04-27: 100 mg via SUBCUTANEOUS
  Filled 2024-04-27: qty 1

## 2024-04-27 NOTE — Progress Notes (Signed)
 Diagnosis: Asthma  Provider:  Mannam, Praveen MD  Procedure: Injection  Nucala  (Mepolizumab ), Dose: 100 mg, Site: subcutaneous, Number of injections: 1  Injection Site(s): Right arm  Post Care: Observation period completed  Discharge: Condition: Good, Destination: Home . AVS Declined  Performed by:  Trudy Lamarr LABOR, RN

## 2024-04-28 ENCOUNTER — Ambulatory Visit: Payer: Self-pay | Admitting: Internal Medicine

## 2024-05-01 ENCOUNTER — Ambulatory Visit (HOSPITAL_COMMUNITY): Admission: RE | Admit: 2024-05-01 | Source: Ambulatory Visit

## 2024-05-01 NOTE — Progress Notes (Signed)
 Remote Loop Recorder Transmission

## 2024-05-03 NOTE — Progress Notes (Signed)
 Remote Loop Recorder Transmission

## 2024-05-04 ENCOUNTER — Encounter

## 2024-05-18 NOTE — Progress Notes (Signed)
 Remote Loop Recorder Transmission

## 2024-05-23 ENCOUNTER — Ambulatory Visit (HOSPITAL_COMMUNITY)
Admission: RE | Admit: 2024-05-23 | Discharge: 2024-05-23 | Disposition: A | Discharge status: Home / Self Care | Source: Ambulatory Visit | Attending: Acute Care | Admitting: Acute Care

## 2024-05-23 DIAGNOSIS — Z87891 Personal history of nicotine dependence: Secondary | ICD-10-CM | POA: Diagnosis present

## 2024-05-23 DIAGNOSIS — Z122 Encounter for screening for malignant neoplasm of respiratory organs: Secondary | ICD-10-CM | POA: Diagnosis present

## 2024-05-24 ENCOUNTER — Ambulatory Visit: Payer: Medicare Other | Admitting: Allergy & Immunology

## 2024-05-24 ENCOUNTER — Other Ambulatory Visit: Payer: Self-pay

## 2024-05-24 ENCOUNTER — Encounter: Payer: Self-pay | Admitting: Allergy & Immunology

## 2024-05-24 VITALS — BP 140/100 | HR 79 | Temp 97.6°F | Resp 18 | Ht 66.0 in | Wt 180.8 lb

## 2024-05-24 DIAGNOSIS — J4550 Severe persistent asthma, uncomplicated: Secondary | ICD-10-CM | POA: Diagnosis not present

## 2024-05-24 DIAGNOSIS — J302 Other seasonal allergic rhinitis: Secondary | ICD-10-CM

## 2024-05-24 DIAGNOSIS — K219 Gastro-esophageal reflux disease without esophagitis: Secondary | ICD-10-CM

## 2024-05-24 DIAGNOSIS — J3089 Other allergic rhinitis: Secondary | ICD-10-CM

## 2024-05-24 MED ORDER — FLUTICASONE PROPIONATE 50 MCG/ACT NA SUSP: 2.0000 | Freq: Every day | NASAL | 5 refills | Status: AC

## 2024-05-24 MED ORDER — AZELASTINE HCL 0.1 % NA SOLN: 2.0000 | Freq: Two times a day (BID) | NASAL | 5 refills | Status: AC | PRN

## 2024-05-24 NOTE — Patient Instructions (Addendum)
 1. Severe persistent asthma, uncomplicated - Spirometry looked a little worse. - We are going to change from Symbicort  to Breztri (contains Symbicort  plus a third medication that can help with asthma).  - Daily controller medication(s): Symbicort  two puffs twice daily with spacer + Singulair  (montelukast ) 10mg  daily + Nucala  100mg  monthly - Rescue medications: albuterol  4 puffs every 4-6 hours as needed - Asthma control goals:  * Full participation in all desired activities (may need albuterol  before activity) * Albuterol  use two time or less a week on average (not counting use with activity) * Cough interfering with sleep two time or less a month * Oral steroids no more than once a year * No hospitalizations  2.Chronic nonseasonal allergic rhinitis - Continue with azelastine  and fluticasone  daily.  - Continue with allergy  shots at the same schedule.   3. Return in about 2 months (around 07/24/2024). You can have the follow up appointment with Dr. Iva or a Nurse Practicioner (our Nurse Practitioners are excellent and always have Physician oversight!).    Please inform us  of any Emergency Department visits, hospitalizations, or changes in symptoms. Call us  before going to the ED for breathing or allergy  symptoms since we might be able to fit you in for a sick visit. Feel free to contact us  anytime with any questions, problems, or concerns.  It was a pleasure to see you again today!  Websites that have reliable patient information: 1. American Academy of Asthma, Allergy , and Immunology: www.aaaai.org 2. Food Allergy  Research and Education (FARE): foodallergy.org 3. Mothers of Asthmatics: http://www.asthmacommunitynetwork.org 4. [employer] of Allergy , Asthma, and Immunology: www.acaai.org      "Like" us  on Facebook and Instagram for our latest updates!      A healthy democracy works best when [employer] participate! Make sure you are registered to vote! If you have moved  or changed any of your contact information, you will need to get this updated before voting! Scan the QR codes below to learn more!

## 2024-05-24 NOTE — Progress Notes (Signed)
 FOLLOW UP  Date of Service/Encounter:  05/24/24   Assessment:   Severe persistent asthma, uncomplicated - doing well on Nucala  monthly (administered by Pulmonology)   Chronic nonseasonal allergic rhinitis (trees, mold, dust mite) - on allergen immunotherapy with maintenance reached May 2018 for the TREE VIAL and maintenance reached in e mold/DM vial   Previous World Dietitian   Recent stroke - with full recovery    2 years smoke free - although he admits that he cheats occasionally  Plan/Recommendations:   1. Severe persistent asthma, uncomplicated - Spirometry looked a little worse. - We are going to change from Symbicort  to Breztri (contains Symbicort  plus a third medication that can help with asthma).  - Daily controller medication(s): Symbicort  two puffs twice daily with spacer + Singulair  (montelukast ) 10mg  daily + Nucala  100mg  monthly - Rescue medications: albuterol  4 puffs every 4-6 hours as needed - Asthma control goals:  * Full participation in all desired activities (may need albuterol  before activity) * Albuterol  use two time or less a week on average (not counting use with activity) * Cough interfering with sleep two time or less a month * Oral steroids no more than once a year * No hospitalizations  2.Chronic nonseasonal allergic rhinitis - Continue with azelastine  and fluticasone  daily.  - Continue with allergy  shots at the same schedule.   3. Return in about 2 months (around 07/24/2024). You can have the follow up appointment with Dr. Iva or a Nurse Practicioner (our Nurse Practitioners are excellent and always have Physician oversight!).    Subjective:   Caleb Evans is a 73 y.o. male presenting today for follow up of  Chief Complaint  Patient presents with   Follow-up    Pt reports headaches, runny nose, wheezing, shortness of breath. Wakes up during the night from hearing hisself wheeze. ACT 7    Caleb Evans  has a history of the following: Patient Active Problem List   Diagnosis Date Noted   HTN (hypertension) 03/14/2024   Screening for cardiovascular condition 06/11/2023   Cryptogenic stroke (HCC) 08/17/2019   CVA (cerebral vascular accident) (HCC) 05/29/2019   Former cigarette smoker 05/29/2019   Aneurysm of thoracic aorta 11/22/2017   Hyperlipidemia 11/22/2017   AAA (abdominal aortic aneurysm) without rupture 11/22/2017   Seasonal and perennial allergic rhinitis 04/19/2017   Severe persistent asthma, uncomplicated (HCC) 04/08/2016   GERD (gastroesophageal reflux disease) 04/08/2016   Chronic nonseasonal allergic rhinitis due to fungal spores 01/17/2014   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  Discussed the use of AI scribe software for clinical note transcription with the patient and/or guardian, who gave verbal consent to proceed.  Caleb is a 73 y.o. male presenting for a follow up visit.  He was last seen in October 2024 by Dr. Tobie.  At that time, asthma seem to be under good control.  His spirometry showed obstruction but was doing well otherwise.  He was continued on Symbicort  2 puffs twice daily as well as Singulair  and Nucala .  For his rhinitis, his symptoms were not under great control.  We continue with Flonase  as well as azelastine  and Zyrtec .  Since the last visit, he has done well.  Asthma/Respiratory Symptom History: He has been experiencing persistent wheezing for the past two months. The wheezing occurs constantly and sometimes wakes him up at night. He uses a humidifier  in his room, which his wife believes helps, but he is uncertain of its effectiveness. He is currently on Nucala , Symbicort , Singulair , and albuterol . He is scheduled for his Nucala  injection tomorrow. He has been using albuterol  two to three times a day over the last couple of weeks,  which is more frequent than usual. His symptoms tend to worsen in the summer and improve in the winter. He is receiving Nucala  in [place] with Pulmonology. He likes this arrangement because it gives him an excuse to go to [place].   He recalls being on a different inhaler in the past, but it was discontinued due to issues with the pharmacy and changes in the [employer].   Allergic Rhinitis Symptom History: He also mentions using [employer]  for nasal symptoms in the past, but it was discontinued after multiple company takeovers. He has a history of receiving allergy  shots, which he feels have been beneficial, especially as the Crepe Myrtles are dying, which he believes will improve his symptoms.   Evans is on allergen immunotherapy. He receives two injections. Immunotherapy script #1 contains trees. He currently receives 0.50mL of the RED vial (1/100). Immunotherapy script #2 contains molds and dust mites. He currently receives 0.50mL of the RED vial (1/100). He started shots December of 2017 and reached maintenance in May of 2018 of the TREE vial and May 2024 of the MOLD/DM vial. He receives monthly injection of each at this point in time.   His newest allergy  shot vial contains Alternaria, Cladosporium, Aspergillus, penicillium, Fusarium, Aureobasidium, Rhizopus, and dust mites.  His testing in August 2023 was still positive to tree mix, otherwise he had loss sensitizations to the weeds.  Otherwise, there have been no changes to his past medical history, surgical history, family history, or social history.    Review of systems otherwise negative other than that mentioned in the HPI.    Objective:   Blood pressure (!) 140/100, pulse 79, temperature 97.6 F (36.4 C), temperature source Temporal, resp. rate 18, height 5' 6 (1.676 m), weight 180 lb 12.8 oz (82 kg), SpO2 94%. Body mass index is 29.18 kg/m.    Physical Exam Vitals reviewed.  Constitutional:      Appearance: He  is well-developed.     Comments: Very talkative.  Boisterous per usual.   HENT:     Head: Normocephalic and atraumatic.     Right Ear: Tympanic membrane, ear canal and external ear normal.     Left Ear: Tympanic membrane, ear canal and external ear normal.     Nose: Rhinorrhea present. No nasal deformity, septal deviation or mucosal edema.     Right Turbinates: Enlarged. Not swollen or pale.     Left Turbinates: Enlarged. Not swollen or pale.     Right Sinus: No maxillary sinus tenderness or frontal sinus tenderness.     Left Sinus: No maxillary sinus tenderness or frontal sinus tenderness.     Mouth/Throat:     Mouth: Mucous membranes are not pale and not dry.     Pharynx: Uvula midline.  Eyes:     General: Lids are normal. No allergic shiner.       Right eye: No discharge.        Left eye: No discharge.     Conjunctiva/sclera: Conjunctivae normal.     Right eye: Right conjunctiva is not injected. No chemosis.    Left eye: Left conjunctiva is not injected. No chemosis.    Pupils: Pupils are equal, round, and reactive to  light.  Cardiovascular:     Rate and Rhythm: Normal rate and regular rhythm.     Heart sounds: Normal heart sounds.  Pulmonary:     Effort: Pulmonary effort is normal. No tachypnea, accessory muscle usage or respiratory distress.     Breath sounds: Normal breath sounds. No wheezing, rhonchi or rales.     Comments: Moving air well in all lung fields.  No increased work of breathing. Chest:     Chest wall: No tenderness.  Lymphadenopathy:     Cervical: No cervical adenopathy.  Skin:    General: Skin is warm.     Capillary Refill: Capillary refill takes less than 2 seconds.     Coloration: Skin is not pale.     Findings: No abrasion, erythema, petechiae or rash. Rash is not papular, urticarial or vesicular.     Comments: No eczematous or urticarial lesions noted.  Neurological:     Mental Status: He is alert.  Psychiatric:        Behavior: Behavior is  cooperative.      Diagnostic studies:    Spirometry: results normal (FEV1: 1.80/65%, FVC: 2.83/78%, FEV1/FVC: 64%).    Spirometry consistent with normal pattern.   Allergy  Studies: none       Marty Shaggy, MD  [facility] 

## 2024-05-25 ENCOUNTER — Other Ambulatory Visit: Payer: Self-pay

## 2024-05-25 ENCOUNTER — Encounter

## 2024-05-25 ENCOUNTER — Ambulatory Visit: Admitting: *Deleted

## 2024-05-25 VITALS — BP 131/84 | HR 79 | Temp 98.3°F | Resp 18 | Ht 66.0 in | Wt 181.2 lb

## 2024-05-25 DIAGNOSIS — J4550 Severe persistent asthma, uncomplicated: Secondary | ICD-10-CM

## 2024-05-25 DIAGNOSIS — Z122 Encounter for screening for malignant neoplasm of respiratory organs: Secondary | ICD-10-CM

## 2024-05-25 DIAGNOSIS — Z87891 Personal history of nicotine dependence: Secondary | ICD-10-CM

## 2024-05-25 MED ORDER — MEPOLIZUMAB 100 MG ~~LOC~~ SOLR
100.0000 mg | Freq: Once | SUBCUTANEOUS | Status: AC
  Administered 2024-05-25: 100 mg via SUBCUTANEOUS
  Filled 2024-05-25: qty 1

## 2024-05-25 NOTE — Progress Notes (Signed)
 Diagnosis: Asthma  Provider:  Mannam, Praveen MD  Procedure: Injection  Nucala  (Mepolizumab ), Dose: 100 mg, Site: subcutaneous, Number of injections: 1  Injection Site(s): Left arm  Post Care: Patient declined observation  Discharge: Condition: Good, Destination: Home . AVS Declined  Performed by:  Sinan Tuch E, RN

## 2024-05-26 ENCOUNTER — Ambulatory Visit (INDEPENDENT_AMBULATORY_CARE_PROVIDER_SITE_OTHER)

## 2024-05-26 DIAGNOSIS — I639 Cerebral infarction, unspecified: Secondary | ICD-10-CM | POA: Diagnosis not present

## 2024-05-28 LAB — CUP PACEART REMOTE DEVICE CHECK
Date Time Interrogation Session: 20251016230407
Implantable Pulse Generator Implant Date: 20210107

## 2024-05-30 NOTE — Progress Notes (Signed)
 Remote Loop Recorder Transmission

## 2024-05-31 ENCOUNTER — Ambulatory Visit: Payer: Self-pay | Admitting: Internal Medicine

## 2024-06-05 ENCOUNTER — Encounter

## 2024-06-26 ENCOUNTER — Ambulatory Visit

## 2024-06-26 ENCOUNTER — Ambulatory Visit (INDEPENDENT_AMBULATORY_CARE_PROVIDER_SITE_OTHER)

## 2024-06-26 ENCOUNTER — Other Ambulatory Visit: Payer: Self-pay | Admitting: Physician Assistant

## 2024-06-26 ENCOUNTER — Encounter

## 2024-06-26 VITALS — BP 165/94 | HR 71 | Temp 98.4°F | Resp 20 | Ht 67.0 in | Wt 179.2 lb

## 2024-06-26 DIAGNOSIS — I639 Cerebral infarction, unspecified: Secondary | ICD-10-CM

## 2024-06-26 DIAGNOSIS — J4550 Severe persistent asthma, uncomplicated: Secondary | ICD-10-CM | POA: Diagnosis not present

## 2024-06-26 LAB — CUP PACEART REMOTE DEVICE CHECK
Date Time Interrogation Session: 20251116230104
Implantable Pulse Generator Implant Date: 20210107

## 2024-06-26 MED ORDER — MEPOLIZUMAB 100 MG ~~LOC~~ SOLR
100.0000 mg | Freq: Once | SUBCUTANEOUS | Status: AC
  Administered 2024-06-26: 100 mg via SUBCUTANEOUS
  Filled 2024-06-26: qty 1

## 2024-06-26 NOTE — Telephone Encounter (Unsigned)
 Copied from CRM #[id number]. Topic: Clinical - Medication Refill >> Jun 26, 2024  4:26 PM Ashley R wrote: Medication: losartan (COZAAR) 25 MG tablet (from a different provider, have discussed with primary)  Has the patient contacted their pharmacy? Yes (Agent: If no, request that the patient contact the pharmacy for the refill. If patient does not wish to contact the pharmacy document the reason why and proceed with request.) (Agent: If yes, when and what did the pharmacy advise?)  This is the patient's preferred pharmacy:   [facility] [phone] - [place], [place] - [address] AT [facility] [address] [place] [address] Phone: [phone] Fax: [phone]  Is this the correct pharmacy for this prescription? Yes If no, delete pharmacy and type the correct one.   Has the prescription been filled recently? Yes  Is the patient out of the medication? No  Has the patient been seen for an appointment in the last year OR does the patient have an upcoming appointment? Yes  Can we respond through MyChart? Yes  Agent: Please be advised that Rx refills may take up to 3 business days. We ask that you follow-up with your pharmacy.

## 2024-06-26 NOTE — Telephone Encounter (Unsigned)
 Copied from CRM #[id number]. Topic: Clinical - Medication Refill >> Jun 26, 2024  4:27 PM Ashley R wrote: Medication: hydrochlorothiazide  (HYDRODIURIL ) 12.5 MG tablet  Has the patient contacted their pharmacy? Yes, no refills  [facility] [phone] - [place], [place] - [address] AT [facility] [address] [place] [address] Phone: [phone] Fax: [phone]  Is this the correct pharmacy for this prescription? Yes If no, delete pharmacy and type the correct one.   Has the prescription been filled recently? Yes  Is the patient out of the medication? No  Has the patient been seen for an appointment in the last year OR does the patient have an upcoming appointment? Yes, willing to come into   Can we respond through MyChart? Yes  Agent: Please be advised that Rx refills may take up to 3 business days. We ask that you follow-up with your pharmacy.

## 2024-06-26 NOTE — Progress Notes (Signed)
 Diagnosis: Asthma  Provider:  Mannam, Praveen MD  Procedure: Injection  Nucala  (Mepolizumab ), Dose: 100 mg, Site: subcutaneous, Number of injections: 1  Injection Site(s): Right arm  Post Care: Patient declined observation  Discharge: Condition: Good, Destination: Home . AVS Provided  Performed by:  Leita FORBES Miles, LPN

## 2024-06-27 MED ORDER — HYDROCHLOROTHIAZIDE 12.5 MG PO TABS: 12.5000 mg | ORAL_TABLET | Freq: Every day | ORAL | 1 refills | Status: AC

## 2024-06-28 ENCOUNTER — Ambulatory Visit (INDEPENDENT_AMBULATORY_CARE_PROVIDER_SITE_OTHER)

## 2024-06-28 ENCOUNTER — Ambulatory Visit: Payer: Self-pay | Admitting: Internal Medicine

## 2024-06-28 DIAGNOSIS — J309 Allergic rhinitis, unspecified: Secondary | ICD-10-CM

## 2024-06-28 NOTE — Progress Notes (Signed)
 Remote Loop Recorder Transmission

## 2024-07-05 ENCOUNTER — Telehealth: Payer: Self-pay | Admitting: Allergy & Immunology

## 2024-07-05 ENCOUNTER — Ambulatory Visit (INDEPENDENT_AMBULATORY_CARE_PROVIDER_SITE_OTHER)

## 2024-07-05 DIAGNOSIS — J3089 Other allergic rhinitis: Secondary | ICD-10-CM

## 2024-07-05 DIAGNOSIS — J309 Allergic rhinitis, unspecified: Secondary | ICD-10-CM

## 2024-07-05 NOTE — Telephone Encounter (Signed)
 Pt states that we are billing medicare instead of the emblem health insurance.

## 2024-07-06 ENCOUNTER — Encounter

## 2024-07-12 ENCOUNTER — Ambulatory Visit

## 2024-07-12 DIAGNOSIS — J302 Other seasonal allergic rhinitis: Secondary | ICD-10-CM | POA: Diagnosis not present

## 2024-07-12 DIAGNOSIS — J309 Allergic rhinitis, unspecified: Secondary | ICD-10-CM

## 2024-07-26 ENCOUNTER — Ambulatory Visit: Admitting: Physician Assistant

## 2024-07-26 ENCOUNTER — Encounter: Payer: Self-pay | Admitting: Physician Assistant

## 2024-07-26 VITALS — BP 123/81 | HR 84 | Temp 97.5°F | Wt 175.0 lb

## 2024-07-26 DIAGNOSIS — B9689 Other specified bacterial agents as the cause of diseases classified elsewhere: Secondary | ICD-10-CM | POA: Diagnosis not present

## 2024-07-26 DIAGNOSIS — J069 Acute upper respiratory infection, unspecified: Secondary | ICD-10-CM | POA: Insufficient documentation

## 2024-07-26 MED ORDER — DOXYCYCLINE HYCLATE 100 MG PO TABS: 100.0000 mg | ORAL_TABLET | Freq: Two times a day (BID) | ORAL | 0 refills | Status: AC

## 2024-07-26 NOTE — Assessment & Plan Note (Signed)
 Patient appears stable today. Benign exam, lung with wheezing bilaterally, no indication for chest XR at this time. Discussed that this fits the picture of viral vs bacterial URI and that due to type and duration of symptoms and exam findings, we will treat as bacterial URI. No evidence of other bacterial infections including pneumonia, pharyngitis, sinusitis, or otitis media. Supportive care reviewed with patient. Tylenol  or ibuprofen for pain as needed. May continue with OTC cold medications. Patient instructed to return to clinic if worsening shortness of breath, chest pain, hypoxia, or other concerns. Patient agreeable to plan.

## 2024-07-26 NOTE — Progress Notes (Signed)
 Acute Office Visit  Subjective:     Patient ID: Caleb Evans, male    DOB: 1951/05/14, 73 y.o.   MRN: [id number]   Discussed the use of AI scribe software for clinical note transcription with the patient, who gave verbal consent to proceed.  History of Present Illness Caleb Evans is a 73 year old male who presents with worsening respiratory symptoms and congestion.   For about 1.5 weeks he has had congestion, increased mucus, and hoarseness, with persistent malaise despite home remedies including Nyquil, Robitussin, honey, whiskey, and lemon. He denies fever but notes feeling cold. Appetite is normal and he is staying well hydrated.  Wheezing has worsened, especially at night. He uses Breztri from his allergist but feels off balance when he takes it. Nebulizer treatments provide symptom relief.  He is allergic to amoxicillin, which caused jaundice-like symptoms in the past. At night he has a dry throat with coughing fits that disrupt sleep, improved with nebulizer treatments.  Nyquil and Robitussin cause gastrointestinal upset with diarrhea, so he is avoiding them.   Review of Systems  Constitutional:  Positive for activity change, chills and fatigue. Negative for appetite change and fever.  HENT:  Positive for congestion, rhinorrhea, sneezing, sore throat and voice change. Negative for ear pain.   Respiratory:  Positive for cough and wheezing. Negative for shortness of breath.   Cardiovascular:  Negative for chest pain.  Musculoskeletal:  Negative for arthralgias.  Neurological:  Negative for headaches.       Objective:     BP 123/81   Pulse 84   Temp (!) 97.5 F (36.4 C)   Wt 175 lb (79.4 kg)   SpO2 97%   BMI 27.41 kg/m   Physical Exam Vitals reviewed.  Constitutional:      General: He is not in acute distress.    Appearance: Normal appearance. He is normal weight. He is ill-appearing.  HENT:     Head: Normocephalic and atraumatic.      Right Ear: Tympanic membrane normal.     Left Ear: Tympanic membrane normal.     Nose: Congestion present.     Mouth/Throat:     Mouth: Mucous membranes are moist.     Pharynx: Oropharynx is clear. Posterior oropharyngeal erythema present.  Eyes:     Extraocular Movements: Extraocular movements intact.     Conjunctiva/sclera: Conjunctivae normal.  Cardiovascular:     Rate and Rhythm: Normal rate and regular rhythm.     Heart sounds: Normal heart sounds. No murmur heard. Pulmonary:     Effort: Pulmonary effort is normal.     Breath sounds: Wheezing present. No rhonchi or rales.  Musculoskeletal:        General: Normal range of motion.     Cervical back: No tenderness.  Lymphadenopathy:     Cervical: No cervical adenopathy.  Skin:    General: Skin is warm and dry.     Capillary Refill: Capillary refill takes less than 2 seconds.  Neurological:     General: No focal deficit present.     Mental Status: He is alert and oriented to person, place, and time.  Psychiatric:        Mood and Affect: Mood normal.        Behavior: Behavior normal.     No results found for any visits on 07/26/24.      Assessment & Plan:  Bacterial URI Assessment & Plan: Patient appears stable today. Benign exam, lung with  wheezing bilaterally, no indication for chest XR at this time. Discussed that this fits the picture of viral vs bacterial URI and that due to type and duration of symptoms and exam findings, we will treat as bacterial URI. No evidence of other bacterial infections including pneumonia, pharyngitis, sinusitis, or otitis media. Supportive care reviewed with patient. Tylenol  or ibuprofen for pain as needed. May continue with OTC cold medications. Patient instructed to return to clinic if worsening shortness of breath, chest pain, hypoxia, or other concerns. Patient agreeable to plan.   Orders: -     Doxycycline  Hyclate; Take 1 tablet (100 mg total) by mouth 2 (two) times daily for 7 days.   Dispense: 14 tablet; Refill: 0    Return if symptoms worsen or fail to improve.  Charmaine Sharnee Douglass, PA-C

## 2024-07-27 ENCOUNTER — Encounter

## 2024-07-27 DIAGNOSIS — I639 Cerebral infarction, unspecified: Secondary | ICD-10-CM

## 2024-07-27 LAB — CUP PACEART REMOTE DEVICE CHECK
Date Time Interrogation Session: 20251217230316
Implantable Pulse Generator Implant Date: 20210107

## 2024-07-27 NOTE — Patient Instructions (Incomplete)
 1. Severe persistent asthma, uncomplicated - Spirometry looked a little worse. - We are going to change from Symbicort  to Breztri (contains Symbicort  plus a third medication that can help with asthma).  - Daily controller medication(s): Symbicort  two puffs twice daily with spacer + Singulair  (montelukast ) 10mg  daily + Nucala  100mg  monthly - Rescue medications: albuterol  4 puffs every 4-6 hours as needed - Asthma control goals:  * Full participation in all desired activities (may need albuterol  before activity) * Albuterol  use two time or less a week on average (not counting use with activity) * Cough interfering with sleep two time or less a month * Oral steroids no more than once a year * No hospitalizations  2.Chronic nonseasonal allergic rhinitis - Continue with azelastine  and fluticasone  daily.  - Continue with allergy  shots at the same schedule.   3. No follow-ups on file. You can have the follow up appointment with Dr. Iva or a Nurse Practicioner (our Nurse Practitioners are excellent and always have Physician oversight!).    Please inform us  of any Emergency Department visits, hospitalizations, or changes in symptoms. Call us  before going to the ED for breathing or allergy  symptoms since we might be able to fit you in for a sick visit. Feel free to contact us  anytime with any questions, problems, or concerns.  It was a pleasure to see you again today!  Websites that have reliable patient information: 1. American Academy of Asthma, Allergy , and Immunology: www.aaaai.org 2. Food Allergy  Research and Education (FARE): foodallergy.org 3. Mothers of Asthmatics: http://www.asthmacommunitynetwork.org 4. [employer] of Allergy , Asthma, and Immunology: www.acaai.org

## 2024-07-27 NOTE — Progress Notes (Unsigned)
° °  [address] [place] [address] Dept: [phone]  FOLLOW UP NOTE  Patient ID: Jerilynn LILLETTE Harden, male    DOB: February 20, 1951  Age: 73 y.o. MRN: [id number] Date of Office Visit: 07/28/2024  Assessment  Chief Complaint: No chief complaint on file.  HPI NTHONY LEFFERTS is a 73 year old male who presents to the clinic for follow-up visit.  He was last seen in this clinic on 05/24/2024 by Dr. Iva for evaluation of asthma on Nucala  and allergic rhinitis on allergen immunotherapy.  He reached maintenance on the tree allergen immunotherapy in 2018 and a new prescription was written 03/2023 for mold and dust mite allergen immunotherapy. Discussed the use of AI scribe software for clinical note transcription with the patient, who gave verbal consent to proceed.  History of Present Illness      Drug Allergies:  Allergies[1]  Physical Exam: There were no vitals taken for this visit.   Physical Exam  Diagnostics:    Assessment and Plan: No diagnosis found.  No orders of the defined types were placed in this encounter.   There are no Patient Instructions on file for this visit.  No follow-ups on file.    Thank you for the opportunity to care for this patient.  Please do not hesitate to contact me with questions.  Arlean Mutter, FNP [facility]          [1]  Allergies Allergen Reactions   Chlorpheniramine Other (See Comments)    Sinus congestion   Amoxicillin-Pot Clavulanate Other (See Comments)    UNSPECIFIED REACTION  Has patient had a PCN reaction causing immediate rash, facial/tongue/throat swelling, SOB or lightheadedness with hypotension: No Has patient had a PCN reaction causing severe rash involving mucus membranes or skin necrosis: No Has patient had a PCN reaction that required hospitalization: No Has patient had a PCN reaction occurring within the last 10 years: Yes If all of the above answers are  NO, then may proceed with Cephalosporin use.  Caused liver failure    Lisinopril  Cough

## 2024-07-28 ENCOUNTER — Encounter: Payer: Self-pay | Admitting: Family Medicine

## 2024-07-28 ENCOUNTER — Ambulatory Visit: Admitting: Family Medicine

## 2024-07-28 ENCOUNTER — Other Ambulatory Visit: Payer: Self-pay

## 2024-07-28 VITALS — BP 152/80 | HR 102 | Temp 97.9°F

## 2024-07-28 DIAGNOSIS — J3089 Other allergic rhinitis: Secondary | ICD-10-CM | POA: Diagnosis not present

## 2024-07-28 DIAGNOSIS — J4550 Severe persistent asthma, uncomplicated: Secondary | ICD-10-CM

## 2024-07-28 DIAGNOSIS — J302 Other seasonal allergic rhinitis: Secondary | ICD-10-CM | POA: Diagnosis not present

## 2024-07-28 DIAGNOSIS — J069 Acute upper respiratory infection, unspecified: Secondary | ICD-10-CM | POA: Diagnosis not present

## 2024-07-30 MED ORDER — EPINEPHRINE 0.3 MG/0.3ML IJ SOAJ: 0.3000 mg | INTRAMUSCULAR | 1 refills | Status: AC | PRN

## 2024-07-31 ENCOUNTER — Ambulatory Visit

## 2024-07-31 NOTE — Progress Notes (Signed)
 Remote Loop Recorder Transmission

## 2024-08-01 ENCOUNTER — Encounter: Payer: Self-pay | Admitting: Physician Assistant

## 2024-08-01 ENCOUNTER — Ambulatory Visit: Admitting: Physician Assistant

## 2024-08-01 VITALS — BP 134/87 | HR 84 | Temp 96.6°F | Ht 67.0 in | Wt 174.0 lb

## 2024-08-01 DIAGNOSIS — H6121 Impacted cerumen, right ear: Secondary | ICD-10-CM | POA: Diagnosis not present

## 2024-08-01 NOTE — Assessment & Plan Note (Signed)
 Right ear completely occluded with cerumen causing hearing difficulty. Right ear irrigated successfully and patient tolerated well. On reexamination, TM is non erythema and non bulging.  Patient reports resolution of symptoms following irrigation.

## 2024-08-01 NOTE — Progress Notes (Signed)
" ° °  Acute Office Visit  Subjective:     Patient ID: Caleb Evans, male    DOB: 1951/04/06, 73 y.o.   MRN: [id number]   Discussed the use of AI scribe software for clinical note transcription with the patient, who gave verbal consent to proceed.  History of Present Illness Caleb Evans is a 73 year old male who presents with ear blockage and hearing difficulties. He reports complete blockage of the right ear for 4 days. 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 He tried a Q-tip, which gave brief minimal relief, but the ear became plugged again over the next two nights. He tends to develop earwax buildup and has not used any over-the-counter earwax removal products recently.     Review of Systems  Constitutional:  Negative for activity change, appetite change, fatigue and fever.  HENT:  Positive for ear pain and hearing loss. Negative for congestion.        Objective:     BP 134/87 (BP Location: Left Arm, Patient Position: Sitting)   Pulse 84   Temp (!) 96.6 F (35.9 C)   Ht 5' 7 (1.702 m)   Wt 174 lb (78.9 kg)   SpO2 92%   BMI 27.25 kg/m   Physical Exam Constitutional:      General: He is not in acute distress.    Appearance: Normal appearance. He is normal weight. He is not ill-appearing.  HENT:     Head: Normocephalic and atraumatic.     Right Ear: Tympanic membrane normal. There is impacted cerumen.     Left Ear: Tympanic membrane normal.  Cardiovascular:     Rate and Rhythm: Normal rate.     Heart sounds: Normal heart sounds. No murmur heard. Pulmonary:     Effort: Pulmonary effort is normal.     Breath sounds: Normal breath sounds.  Neurological:     General: No focal deficit present.     Mental Status: He is alert.     No results found for any visits on 08/01/24.      Assessment & Plan:  Impacted cerumen of right ear Assessment &  Plan: Right ear completely occluded with cerumen causing hearing difficulty. Right ear irrigated successfully and patient tolerated well. On reexamination, TM is non erythema and non bulging.  Patient reports resolution of symptoms following irrigation.   Orders: -     Ear wax removal   No follow-ups on file.  Selina Tapper, PA-C  "

## 2024-08-04 ENCOUNTER — Ambulatory Visit: Payer: Self-pay | Admitting: Student in an Organized Health Care Education/Training Program

## 2024-08-07 ENCOUNTER — Encounter

## 2024-08-09 ENCOUNTER — Ambulatory Visit (INDEPENDENT_AMBULATORY_CARE_PROVIDER_SITE_OTHER)

## 2024-08-09 VITALS — BP 169/106 | HR 88 | Temp 97.8°F | Resp 12 | Ht 67.0 in | Wt 179.0 lb

## 2024-08-09 DIAGNOSIS — J4550 Severe persistent asthma, uncomplicated: Secondary | ICD-10-CM

## 2024-08-09 MED ORDER — MEPOLIZUMAB 100 MG ~~LOC~~ SOLR
100.0000 mg | Freq: Once | SUBCUTANEOUS | Status: AC
  Administered 2024-08-09: 100 mg via SUBCUTANEOUS
  Filled 2024-08-09: qty 1

## 2024-08-09 NOTE — Progress Notes (Signed)
 Diagnosis: Asthma  Provider:  Mannam, Praveen MD  Procedure: Injection  Nucala  (Mepolizumab ), Dose: 100 mg, Site: subcutaneous, Number of injections: 1  Injection Site(s): Left arm  Post Care: Patient declined observation  Discharge: Condition: Good, Destination: Home . AVS Provided  Performed by:  Rocky FORBES Sar, RN

## 2024-08-11 ENCOUNTER — Ambulatory Visit

## 2024-08-11 DIAGNOSIS — J302 Other seasonal allergic rhinitis: Secondary | ICD-10-CM | POA: Diagnosis not present

## 2024-08-27 ENCOUNTER — Ambulatory Visit

## 2024-08-27 DIAGNOSIS — I639 Cerebral infarction, unspecified: Secondary | ICD-10-CM | POA: Diagnosis not present

## 2024-08-28 ENCOUNTER — Encounter

## 2024-08-28 LAB — CUP PACEART REMOTE DEVICE CHECK
Date Time Interrogation Session: 20260117231324
Implantable Pulse Generator Implant Date: 20210107

## 2024-09-01 NOTE — Progress Notes (Signed)
 Remote Loop Recorder Transmission

## 2024-09-05 ENCOUNTER — Telehealth: Payer: Self-pay | Admitting: Pharmacy Technician

## 2024-09-05 NOTE — Telephone Encounter (Signed)
" °  Auth Submission: NO AUTH NEEDED Site of care: Site of care: [facility] Payer: Medicare A/B, Emblem supplement Medication & CPT/J Code(s) submitted: Nucala  (Mepolizumab ) G7817 Diagnosis Code: J45.50 Route of submission (phone, fax, portal): n/a Phone # Fax # Auth type: Buy/Bill PB Units/visits requested: 100 mg q28 days  Reference number: n/a Approval from: 09/05/2024 to 09/09/2025        "

## 2024-09-06 ENCOUNTER — Ambulatory Visit

## 2024-09-06 VITALS — BP 169/95 | HR 85 | Temp 97.6°F | Resp 14 | Ht 67.0 in | Wt 176.6 lb

## 2024-09-06 DIAGNOSIS — J4550 Severe persistent asthma, uncomplicated: Secondary | ICD-10-CM

## 2024-09-06 MED ORDER — MEPOLIZUMAB 100 MG ~~LOC~~ SOLR
100.0000 mg | Freq: Once | SUBCUTANEOUS | Status: AC
  Administered 2024-09-06: 100 mg via SUBCUTANEOUS
  Filled 2024-09-06: qty 1

## 2024-09-06 NOTE — Progress Notes (Signed)
 Diagnosis: Asthma  Provider:  Mannam, Praveen MD  Procedure: Injection  Nucala  (Mepolizumab ), Dose: 100 mg, Site: subcutaneous, Number of injections: 1  Injection Site(s): Right arm  Post Care: Patient declined observation  Discharge: Condition: Good, Destination: Home . AVS Declined  Performed by:  Shawnee Higham, RN

## 2024-09-07 ENCOUNTER — Encounter

## 2024-09-08 ENCOUNTER — Ambulatory Visit

## 2024-09-08 DIAGNOSIS — J302 Other seasonal allergic rhinitis: Secondary | ICD-10-CM | POA: Diagnosis not present

## 2024-09-10 ENCOUNTER — Ambulatory Visit: Payer: Self-pay | Admitting: Student in an Organized Health Care Education/Training Program

## 2024-09-14 ENCOUNTER — Ambulatory Visit: Admitting: Physician Assistant

## 2024-09-19 ENCOUNTER — Ambulatory Visit: Admitting: Physician Assistant

## 2024-09-27 ENCOUNTER — Encounter

## 2024-10-04 ENCOUNTER — Ambulatory Visit

## 2024-10-28 ENCOUNTER — Encounter

## 2024-11-28 ENCOUNTER — Encounter
